# Patient Record
Sex: Male | Born: 1955
Health system: Southern US, Community
[De-identification: ages and names within clinical notes are randomized; demographics above are authoritative.]

## PROBLEM LIST (undated history)

## (undated) DIAGNOSIS — M1990 Unspecified osteoarthritis, unspecified site: Secondary | ICD-10-CM

## (undated) DIAGNOSIS — K219 Gastro-esophageal reflux disease without esophagitis: Secondary | ICD-10-CM

## (undated) DIAGNOSIS — G4730 Sleep apnea, unspecified: Secondary | ICD-10-CM

## (undated) HISTORY — PX: HERNIA REPAIR: SHX51

## (undated) HISTORY — PX: TONSILLECTOMY AND ADENOIDECTOMY: SUR1326

---

## 2005-12-04 DIAGNOSIS — E782 Mixed hyperlipidemia: Secondary | ICD-10-CM | POA: Insufficient documentation

## 2005-12-04 DIAGNOSIS — E7800 Pure hypercholesterolemia, unspecified: Secondary | ICD-10-CM | POA: Insufficient documentation

## 2007-05-20 HISTORY — PX: COLONOSCOPY: SHX174

## 2007-11-02 ENCOUNTER — Ambulatory Visit: Payer: Self-pay | Admitting: Gastroenterology

## 2007-11-02 LAB — HM COLONOSCOPY: HM COLON: NORMAL

## 2012-04-05 ENCOUNTER — Ambulatory Visit: Payer: Self-pay | Admitting: Family Medicine

## 2013-04-29 LAB — HM HEPATITIS C SCREENING LAB: HM Hepatitis Screen: NEGATIVE

## 2014-05-04 LAB — FECAL OCCULT BLOOD, GUAIAC: Fecal Occult Blood: NEGATIVE

## 2014-05-17 LAB — HEPATIC FUNCTION PANEL
ALK PHOS: 124 U/L (ref 25–125)
ALT: 10 U/L (ref 10–40)
AST: 14 U/L (ref 14–40)
Bilirubin, Total: 0.5 mg/dL

## 2014-05-17 LAB — BASIC METABOLIC PANEL
BUN: 11 mg/dL (ref 4–21)
CREATININE: 1 mg/dL (ref 0.6–1.3)
Glucose: 87 mg/dL
Potassium: 4.7 mmol/L (ref 3.4–5.3)
SODIUM: 143 mmol/L (ref 137–147)

## 2014-05-17 LAB — CBC AND DIFFERENTIAL
HEMATOCRIT: 45 % (ref 41–53)
HEMOGLOBIN: 15.7 g/dL (ref 13.5–17.5)
NEUTROS ABS: 65 /uL
Platelets: 236 10*3/uL (ref 150–399)
WBC: 7.1 10*3/mL

## 2014-05-17 LAB — LIPID PANEL
CHOLESTEROL: 195 mg/dL (ref 0–200)
HDL: 38 mg/dL (ref 35–70)
LDL Cholesterol: 135 mg/dL
TRIGLYCERIDES: 110 mg/dL (ref 40–160)

## 2014-05-17 LAB — PSA: PSA: 0.2

## 2015-06-11 DIAGNOSIS — E559 Vitamin D deficiency, unspecified: Secondary | ICD-10-CM | POA: Insufficient documentation

## 2015-06-11 DIAGNOSIS — Z8042 Family history of malignant neoplasm of prostate: Secondary | ICD-10-CM | POA: Insufficient documentation

## 2015-06-11 DIAGNOSIS — F17210 Nicotine dependence, cigarettes, uncomplicated: Secondary | ICD-10-CM | POA: Insufficient documentation

## 2015-06-15 ENCOUNTER — Ambulatory Visit
Admission: RE | Admit: 2015-06-15 | Discharge: 2015-06-15 | Disposition: A | Discharge status: Home / Self Care | Payer: BLUE CROSS/BLUE SHIELD | Source: Ambulatory Visit | Attending: Family Medicine | Admitting: Family Medicine

## 2015-06-15 ENCOUNTER — Ambulatory Visit (INDEPENDENT_AMBULATORY_CARE_PROVIDER_SITE_OTHER): Payer: BLUE CROSS/BLUE SHIELD | Admitting: Family Medicine

## 2015-06-15 ENCOUNTER — Encounter: Payer: Self-pay | Admitting: Family Medicine

## 2015-06-15 VITALS — BP 118/70 | HR 58 | Temp 98.1°F | Resp 14 | Ht 71.5 in | Wt 196.6 lb

## 2015-06-15 DIAGNOSIS — Z720 Tobacco use: Secondary | ICD-10-CM

## 2015-06-15 DIAGNOSIS — F17200 Nicotine dependence, unspecified, uncomplicated: Secondary | ICD-10-CM | POA: Insufficient documentation

## 2015-06-15 DIAGNOSIS — E7800 Pure hypercholesterolemia, unspecified: Secondary | ICD-10-CM

## 2015-06-15 DIAGNOSIS — Z8042 Family history of malignant neoplasm of prostate: Secondary | ICD-10-CM | POA: Diagnosis not present

## 2015-06-15 DIAGNOSIS — E559 Vitamin D deficiency, unspecified: Secondary | ICD-10-CM

## 2015-06-15 DIAGNOSIS — Z0000 Encounter for general adult medical examination without abnormal findings: Secondary | ICD-10-CM | POA: Diagnosis not present

## 2015-06-15 LAB — POCT URINALYSIS DIPSTICK
BILIRUBIN UA: NEGATIVE
Glucose, UA: NEGATIVE
Ketones, UA: NEGATIVE
LEUKOCYTES UA: NEGATIVE
NITRITE UA: NEGATIVE
PH UA: 6.5
Protein, UA: NEGATIVE
RBC UA: NEGATIVE
Spec Grav, UA: 1.025
UROBILINOGEN UA: 0.2

## 2015-06-15 NOTE — Patient Instructions (Signed)

## 2015-06-15 NOTE — Progress Notes (Signed)
Patient ID: Thomas Cross, male   DOB: 1955/05/31, 60 y.o.   MRN: [id number]       Patient: Thomas Cross, Male    DOB: [birth year], 60 y.o.   MRN: [id number] Visit Date: 06/15/2015  Today's Provider: [name], PA   Chief Complaint  Patient presents with  . Annual Exam   Subjective:    Annual physical exam Thomas Cross is a 60 y.o. male who presents today for health maintenance and complete physical. He feels well. He reports exercising none, works 7 days per week. He reports he is sleeping well (average 7-8 hours per night).  ----------------------------------------------------------------- Tdap: 03/18/2011 Colonoscopy: 11/02/2007- Normal Flu: declined  Review of Systems  Constitutional: Negative.   HENT: Positive for tinnitus.        Roaring in ears  Eyes: Positive for visual disturbance.  Respiratory: Positive for wheezing.   Cardiovascular: Negative.   Gastrointestinal: Negative.   Endocrine: Negative.   Genitourinary: Negative.   Musculoskeletal: Positive for back pain and arthralgias.  Skin: Negative.   Allergic/Immunologic: Negative.   Neurological: Negative.   Hematological: Bruises/bleeds easily.  Psychiatric/Behavioral: Negative.     Social History      He  reports that he has been smoking Cigarettes.  He has a 66 pack-year smoking history. He has never used smokeless tobacco. He reports that he drinks alcohol. He reports that he does not use illicit drugs.       Social History   Social History  . Marital Status: Married    Spouse Name: N/A  . Number of Children: N/A  . Years of Education: N/A   Social History Main Topics  . Smoking status: Current Every Day Smoker -- 1.50 packs/day for 44 years    Types: Cigarettes  . Smokeless tobacco: Never Used  . Alcohol Use: 0.0 oz/week    0 Standard drinks or equivalent per week     Comment: occasionally  . Drug Use: No  . Sexual Activity: Not Asked   Other Topics Concern  . None   Social  History Narrative    History reviewed. No pertinent past medical history.   Patient Active Problem List   Diagnosis Date Noted  . Family history of malignant neoplasm of prostate 06/11/2015  . Current tobacco use 06/11/2015  . Avitaminosis D 06/11/2015  . Hypercholesterolemia without hypertriglyceridemia 12/04/2005    Past Surgical History  Procedure Laterality Date  . Hernia repair    . Tonsillectomy and adenoidectomy      Family History        Family Status  Relation Status Death Age  . Mother Deceased   . Father Deceased   . Brother Deceased   . Maternal Grandfather Deceased   . Paternal Grandmother Deceased   . Paternal Grandfather Deceased         His family history includes Bladder Cancer in his mother; CVA in his father; Colon cancer in his paternal grandfather; Dementia in his father; Heart attack in his brother; Heart disease in his maternal grandfather and mother; Hyperlipidemia in his brother, brother, brother, brother, and sister; Hypertension in his father; Kidney cancer in his mother; Prostate cancer in his father; Stroke in his paternal grandmother.    No Known Allergies  Previous Medications   CHOLECALCIFEROL 1000 UNITS CAPSULE    Take by mouth.   MULTIPLE VITAMIN PO    Take by mouth.   OMEGA-3 FATTY ACIDS (FISH OIL) 1000 MG CPDR    Take by mouth.  Patient Care Team: Margo Common, PA as PCP - General (Family Medicine)     Objective:   Vitals: BP 118/70 mmHg  Pulse 58  Temp(Src) 98.1 F (36.7 C) (Oral)  Resp 14  Ht 5' 11.5" (1.816 m)  Wt 196 lb 9.6 oz (89.177 kg)  BMI 27.04 kg/m2  SpO2 98%   Physical Exam  Constitutional: He is oriented to person, place, and time. He appears well-developed and well-nourished.  HENT:  Head: Normocephalic and atraumatic.  Right Ear: External ear normal.  Left Ear: External ear normal.  Nose: Nose normal.  Mouth/Throat: Oropharynx is clear and moist.  Eyes: Conjunctivae and EOM are normal. Pupils are  equal, round, and reactive to light. Right eye exhibits no discharge.  Neck: Normal range of motion. Neck supple. No tracheal deviation present. No thyromegaly present.  Cardiovascular: Normal rate, regular rhythm, normal heart sounds and intact distal pulses.   No murmur heard. Pulmonary/Chest: Effort normal and breath sounds normal. No respiratory distress. He has no wheezes. He has no rales. He exhibits no tenderness.  Abdominal: Soft. He exhibits no distension and no mass. There is no tenderness. There is no rebound and no guarding.  Genitourinary: Rectum normal and penis normal. Guaiac negative stool.  Slightly enlarged prostate.  Musculoskeletal: Normal range of motion. He exhibits no edema or tenderness.  Lymphadenopathy:    He has no cervical adenopathy.  Neurological: He is alert and oriented to person, place, and time. He has normal reflexes. No cranial nerve deficit. He exhibits normal muscle tone. Coordination normal.  Skin: Skin is warm and dry. No rash noted. No erythema.  Psychiatric: He has a normal mood and affect. His behavior is normal. Judgment and thought content normal.   Depression Screen No suicidal ideation. Sleeping well. Normal affect.  Assessment & Plan:     Routine Health Maintenance and Physical Exam  Exercise Activities and Dietary recommendations No specific exercise program. Work is very physical in septic system business.  Immunization History  Administered Date(s) Administered  . Tdap 03/18/2011    Discussed health benefits of physical activity, and encouraged him to engage in regular exercise appropriate for his age and condition.    --------------------------------------------------------------------  1. Annual physical exam Feeling well. Good general health. Last colonoscopy was normal in 2009. Last Tdap was in 2012. Declines any immunizations at the present. Will get routine labs. - POCT urinalysis dipstick  2. Avitaminosis D Continues  vitamin-D 1000 IU qd. Will recheck CBC and vitamin- D level. - CBC with Differential/Platelet - VITAMIN D 25 Hydroxy (Vit-D Deficiency, Fractures)  3. Hypercholesterolemia without hypertriglyceridemia Still trying to take Omega-3 Fatty Acids and follow low fat diet. Recheck labs and follow up pending reports. - COMPLETE METABOLIC PANEL WITH GFR - Lipid panel - TSH  4. Current tobacco use Chronic smoker for 40 years and presently smokes 1.5 ppd. Requests CXR to compare to last x-ray in 2013. - DG Chest 2 View  5. Family history of malignant neoplasm of prostate Father had prostate cancer. DRE today shows some enlargement of gland. Denies urinary hesitancy, decrease stream or nocturia. Will check PSA. - PSA

## 2015-06-16 LAB — LIPID PANEL
Chol/HDL Ratio: 5.5 ratio units — ABNORMAL HIGH (ref 0.0–5.0)
Cholesterol, Total: 202 mg/dL — ABNORMAL HIGH (ref 100–199)
HDL: 37 mg/dL — AB (ref >39)
LDL Calculated: 141 mg/dL — ABNORMAL HIGH (ref 0–99)
TRIGLYCERIDES: 119 mg/dL (ref 0–149)
VLDL Cholesterol Cal: 24 mg/dL (ref 5–40)

## 2015-06-16 LAB — CBC WITH DIFFERENTIAL/PLATELET
Basophils Absolute: 0 10*3/uL (ref 0.0–0.2)
Basos: 0 %
EOS (ABSOLUTE): 0.2 10*3/uL (ref 0.0–0.4)
EOS: 2 %
Hematocrit: 44.1 % (ref 37.5–51.0)
Hemoglobin: 15.5 g/dL (ref 12.6–17.7)
IMMATURE GRANULOCYTES: 0 %
Immature Grans (Abs): 0 10*3/uL (ref 0.0–0.1)
LYMPHS: 22 %
Lymphocytes Absolute: 1.7 10*3/uL (ref 0.7–3.1)
MCH: 33.8 pg — ABNORMAL HIGH (ref 26.6–33.0)
MCHC: 35.1 g/dL (ref 31.5–35.7)
MCV: 96 fL (ref 79–97)
MONOS ABS: 0.7 10*3/uL (ref 0.1–0.9)
Monocytes: 9 %
NEUTROS PCT: 67 %
Neutrophils Absolute: 5.2 10*3/uL (ref 1.4–7.0)
PLATELETS: 252 10*3/uL (ref 150–379)
RBC: 4.59 x10E6/uL (ref 4.14–5.80)
RDW: 12.9 % (ref 12.3–15.4)
WBC: 7.7 10*3/uL (ref 3.4–10.8)

## 2015-06-16 LAB — COMPREHENSIVE METABOLIC PANEL
ALBUMIN: 4.1 g/dL (ref 3.6–4.8)
ALT: 14 IU/L (ref 0–44)
AST: 17 IU/L (ref 0–40)
Albumin/Globulin Ratio: 1.7 (ref 1.1–2.5)
Alkaline Phosphatase: 127 IU/L — ABNORMAL HIGH (ref 39–117)
BUN / CREAT RATIO: 13 (ref 10–22)
BUN: 13 mg/dL (ref 8–27)
Bilirubin Total: 0.6 mg/dL (ref 0.0–1.2)
CALCIUM: 9.2 mg/dL (ref 8.6–10.2)
CO2: 22 mmol/L (ref 18–29)
CREATININE: 0.99 mg/dL (ref 0.76–1.27)
Chloride: 102 mmol/L (ref 96–106)
GFR calc non Af Amer: 82 mL/min/{1.73_m2} (ref >59)
GFR, EST AFRICAN AMERICAN: 95 mL/min/{1.73_m2} (ref >59)
GLUCOSE: 84 mg/dL (ref 65–99)
Globulin, Total: 2.4 g/dL (ref 1.5–4.5)
Potassium: 4.9 mmol/L (ref 3.5–5.2)
Sodium: 140 mmol/L (ref 134–144)
TOTAL PROTEIN: 6.5 g/dL (ref 6.0–8.5)

## 2015-06-16 LAB — PSA: Prostate Specific Ag, Serum: 0.2 ng/mL (ref 0.0–4.0)

## 2015-06-16 LAB — TSH: TSH: 2.58 u[IU]/mL (ref 0.450–4.500)

## 2015-06-16 LAB — VITAMIN D 25 HYDROXY (VIT D DEFICIENCY, FRACTURES): Vit D, 25-Hydroxy: 26.9 ng/mL — ABNORMAL LOW (ref 30.0–100.0)

## 2015-06-18 ENCOUNTER — Telehealth: Payer: Self-pay

## 2015-06-18 NOTE — Telephone Encounter (Signed)
Patient advised as directed below. Patient verbalized understanding. Patient does not want to proceed with starting Pravastatin. Patient states he will think about it and call back.

## 2015-06-18 NOTE — Telephone Encounter (Signed)
-----   Message from Margo Common, [place] sent at 06/18/2015  1:45 PM EST ----- Normal chest x-ray without acute cardiopulmonary disease. Prostate blood test is normal. Vitamin-D level slightly low. Recommend 1000 IU qd of Vitamin-D. LDL higher and HDL lower than a year ago. Needs Pravastatin 20 mg qd #30 & 3 RF. Recheck lipids in 3 months.

## 2015-07-16 ENCOUNTER — Encounter: Payer: Self-pay | Admitting: Family Medicine

## 2015-07-16 ENCOUNTER — Ambulatory Visit (INDEPENDENT_AMBULATORY_CARE_PROVIDER_SITE_OTHER): Payer: BLUE CROSS/BLUE SHIELD | Admitting: Family Medicine

## 2015-07-16 VITALS — BP 130/72 | HR 72 | Temp 98.0°F | Resp 16 | Wt 196.2 lb

## 2015-07-16 DIAGNOSIS — R5383 Other fatigue: Secondary | ICD-10-CM | POA: Diagnosis not present

## 2015-07-16 DIAGNOSIS — R05 Cough: Secondary | ICD-10-CM

## 2015-07-16 DIAGNOSIS — R059 Cough, unspecified: Secondary | ICD-10-CM

## 2015-07-16 MED ORDER — PROMETHAZINE-DM 6.25-15 MG/5ML PO SYRP: 5.0000 mL | ORAL_SOLUTION | Freq: Four times a day (QID) | ORAL | Status: DC | PRN

## 2015-07-16 NOTE — Progress Notes (Signed)
Patient ID: Thomas Cross, male   DOB: Mar 07, 1956, 60 y.o.   MRN: [id number]   Patient: Thomas Cross Male    DOB: 09-Dec-1955   60 y.o.   MRN: [id number] Visit Date: 07/16/2015  Today's Provider: [name], PA   Chief Complaint  Patient presents with  . URI   Subjective:    URI  This is a new problem. Episode onset: 4-5 days ago. The problem has been unchanged. There has been no fever. Associated symptoms include congestion and coughing. Associated symptoms comments: Fatigue, cold sweats. He has tried nothing for the symptoms.   Patient Active Problem List   Diagnosis Date Noted  . Family history of malignant neoplasm of prostate 06/11/2015  . Current tobacco use 06/11/2015  . Avitaminosis D 06/11/2015  . Hypercholesterolemia without hypertriglyceridemia 12/04/2005   Past Surgical History  Procedure Laterality Date  . Hernia repair    . Tonsillectomy and adenoidectomy     Family History  Problem Relation Age of Onset  . Heart disease Mother   . Kidney cancer Mother   . Bladder Cancer Mother   . CVA Father   . Prostate cancer Father   . Dementia Father   . Hypertension Father   . Hyperlipidemia Sister   . Heart attack Brother   . Hyperlipidemia Brother   . Heart disease Maternal Grandfather   . Stroke Paternal Grandmother   . Colon cancer Paternal Grandfather   . Hyperlipidemia Brother   . Hyperlipidemia Brother   . Hyperlipidemia Brother    Previous Medications   CHOLECALCIFEROL 1000 UNITS CAPSULE    Take by mouth.   MULTIPLE VITAMIN PO    Take by mouth.   OMEGA-3 FATTY ACIDS (FISH OIL) 1000 MG CPDR    Take by mouth.    No Known Allergies  Review of Systems  Constitutional: Positive for fatigue.       Cold sweats  HENT: Positive for congestion.   Eyes: Negative.   Respiratory: Positive for cough.   Cardiovascular: Negative.   Gastrointestinal: Negative.   Endocrine: Negative.   Genitourinary: Negative.   Musculoskeletal: Negative.   Skin: Negative.    Allergic/Immunologic: Negative.   Neurological: Negative.   Hematological: Negative.   Psychiatric/Behavioral: Negative.     Social History  Substance Use Topics  . Smoking status: Current Every Day Smoker -- 1.50 packs/day for 44 years    Types: Cigarettes  . Smokeless tobacco: Never Used  . Alcohol Use: 0.0 oz/week    0 Standard drinks or equivalent per week     Comment: occasionally   Objective:   BP 130/72 mmHg  Pulse 72  Temp(Src) 98 F (36.7 C) (Oral)  Resp 16  Wt 196 lb 3.2 oz (88.996 kg)  Physical Exam  Constitutional: He is oriented to person, place, and time. He appears well-developed and well-nourished. No distress.  HENT:  Head: Normocephalic and atraumatic.  Right Ear: Hearing and external ear normal.  Left Ear: Hearing and external ear normal.  Nose: Nose normal.  Mouth/Throat: Oropharynx is clear and moist.  Eyes: Conjunctivae, EOM and lids are normal. Right eye exhibits no discharge. Left eye exhibits no discharge. No scleral icterus.  Neck: Normal range of motion. Neck supple.  Cardiovascular: Normal rate, regular rhythm and normal heart sounds.   Pulmonary/Chest: Effort normal and breath sounds normal. No respiratory distress.  Abdominal: Soft. Bowel sounds are normal. He exhibits mass. There is tenderness.  Tender lump in right inguinal fold near the scar of  a past herniorrhaphy.  Musculoskeletal: Normal range of motion.  Neurological: He is alert and oriented to person, place, and time.  Skin: Skin is intact. No lesion and no rash noted.  Psychiatric: He has a normal mood and affect. His speech is normal and behavior is normal. Thought content normal.      Assessment & Plan:     1. Cough Onset with body aches over the past 5 days. No fever, sore throat, nasal congestion or sputum production. Will treat with cough syrup and encouraged to drink extra fluids. Recheck prn. - POC Influenza A&B(BINAX/QUICKVUE) - promethazine-dextromethorphan  (PROMETHAZINE-DM) 6.25-15 MG/5ML syrup; Take 5 mLs by mouth 4 (four) times daily as needed for cough.  Dispense: 118 mL; Refill: 0  2. Other fatigue Onset with cough and body aches. Flu test negative for influenza A&B. May use Tylenol or Advil prn. - POC Influenza A&B(BINAX/QUICKVUE)

## 2015-07-16 NOTE — Patient Instructions (Signed)

## 2016-03-11 ENCOUNTER — Encounter: Payer: Self-pay | Admitting: Family Medicine

## 2016-03-11 ENCOUNTER — Ambulatory Visit: Payer: Self-pay | Admitting: Family Medicine

## 2016-03-11 ENCOUNTER — Ambulatory Visit
Admission: RE | Admit: 2016-03-11 | Discharge: 2016-03-11 | Disposition: A | Discharge status: Home / Self Care | Payer: BLUE CROSS/BLUE SHIELD | Source: Ambulatory Visit | Attending: Family Medicine | Admitting: Family Medicine

## 2016-03-11 ENCOUNTER — Ambulatory Visit (INDEPENDENT_AMBULATORY_CARE_PROVIDER_SITE_OTHER): Payer: BLUE CROSS/BLUE SHIELD | Admitting: Family Medicine

## 2016-03-11 VITALS — BP 122/86 | HR 64 | Temp 98.5°F | Resp 16 | Wt 198.0 lb

## 2016-03-11 DIAGNOSIS — G8929 Other chronic pain: Secondary | ICD-10-CM | POA: Diagnosis not present

## 2016-03-11 DIAGNOSIS — R6889 Other general symptoms and signs: Secondary | ICD-10-CM

## 2016-03-11 DIAGNOSIS — K4090 Unilateral inguinal hernia, without obstruction or gangrene, not specified as recurrent: Secondary | ICD-10-CM

## 2016-03-11 DIAGNOSIS — M25511 Pain in right shoulder: Secondary | ICD-10-CM | POA: Insufficient documentation

## 2016-03-11 NOTE — Progress Notes (Signed)
Patient: Thomas Cross Male    DOB: Jul 22, 1955   60 y.o.   MRN: [id number] Visit Date: 03/11/2016  Today's Provider: [name], PA   Chief Complaint  Patient presents with  . Hernia    possible  . Memory Loss   Subjective:    HPI Patient c/o possible hernia in right lower abdominal area on and off times several years. Patient denies pain or changes today.  Patient c/o right shoulder pain times 2-3 weeks. Patient denies injury, pt reports increased pain and stiffness during the night. Patient has not tried any OTC medications.  Patient denies memory loss.  Patient scored a 4 on 6 CIT today.   Patient Active Problem List   Diagnosis Date Noted  . Family history of malignant neoplasm of prostate 06/11/2015  . Current tobacco use 06/11/2015  . Avitaminosis D 06/11/2015  . Hypercholesterolemia without hypertriglyceridemia 12/04/2005   Past Surgical History:  Procedure Laterality Date  . HERNIA REPAIR    . TONSILLECTOMY AND ADENOIDECTOMY     Family History  Problem Relation Age of Onset  . Heart disease Mother   . Kidney cancer Mother   . Bladder Cancer Mother   . CVA Father   . Prostate cancer Father   . Dementia Father   . Hypertension Father   . Heart attack Brother   . Hyperlipidemia Brother   . Heart disease Maternal Grandfather   . Stroke Paternal Grandmother   . Colon cancer Paternal Grandfather   . Hyperlipidemia Sister   . Hyperlipidemia Brother   . Hyperlipidemia Brother   . Hyperlipidemia Brother    No Known Allergies  Current Outpatient Prescriptions:  .  Cholecalciferol 1000 units capsule, Take by mouth., Disp: , Rfl:  .  MULTIPLE VITAMIN PO, Take by mouth., Disp: , Rfl:  .  Omega-3 Fatty Acids (FISH OIL) 1000 MG CPDR, Take by mouth., Disp: , Rfl:  .  promethazine-dextromethorphan (PROMETHAZINE-DM) 6.25-15 MG/5ML syrup, Take 5 mLs by mouth 4 (four) times daily as needed for cough., Disp: 118 mL, Rfl: 0  Review of Systems  Social  History  Substance Use Topics  . Smoking status: Current Every Day Smoker    Packs/day: 1.50    Years: 44.00    Types: Cigarettes  . Smokeless tobacco: Never Used  . Alcohol use 0.0 oz/week     Comment: occasionally   Objective:   BP 122/86 (BP Location: Right Arm, Patient Position: Sitting, Cuff Size: Large)   Pulse 64   Temp 98.5 F (36.9 C) (Oral)   Resp 16   Wt 198 lb (89.8 kg)   BMI 27.23 kg/m    Cognitive Testing - 6-CIT  Correct? Score   What year is it? yes 0 0 or 4  What month is it? yes 0 0 or 3  Memorize:    Pia Mau,  42,  [place],      What time is it? (within 1 hour) yes 0 0 or 3  Count backwards from 20 yes 0 0, 2, or 4  Name the months of the year no 1 0, 2, or 4  Repeat name & address above no 3 0, 2, 4, 6, 8, or 10       TOTAL SCORE  4/28   Interpretation:  Normal  Normal (0-7) Abnormal (8-28)   Physical Exam  Constitutional: He is oriented to person, place, and time. He appears well-developed and well-nourished. No distress.  HENT:  Head: Normocephalic and atraumatic.  Right Ear: Hearing normal.  Left Ear: Hearing normal.  Nose: Nose normal.  Eyes: Conjunctivae and lids are normal. Right eye exhibits no discharge. Left eye exhibits no discharge. No scleral icterus.  Pulmonary/Chest: Effort normal. No respiratory distress.  Abdominal: Soft. Bowel sounds are normal. There is tenderness.  Small lump sensation with tenderness in the right groin when he coughs.  Musculoskeletal: Normal range of motion.  Neurological: He is alert and oriented to person, place, and time.  Skin: Skin is intact. No lesion and no rash noted.  Psychiatric: He has a normal mood and affect. His speech is normal and behavior is normal. Thought content normal.      Assessment & Plan:      1. Indirect inguinal hernia Noticing more frequent tenderness in a small lump in the RLQ of abdomen. Has a history of inguinal hernia repair years ago. Suspect recurrence  along the medial border of the incision line. Schedule surgical referral. - Ambulatory referral to General Surgery  2. Forgetfulness Feels he has been more stressed with the amount of work on his job. Essentially normal screening. Recognizes father had dementia later in life. Will monitor for changes and recheck annually.  3. Chronic right shoulder pain Intermittent pain in the right shoulder for a couple years. 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 Will get x-ray to rule out arthritis. May use Aleve prn. If no better in 2 weeks, may need to consider orthopedic referral. - DG Shoulder Right       [name], [facility]

## 2016-03-12 ENCOUNTER — Telehealth: Payer: Self-pay

## 2016-03-12 ENCOUNTER — Encounter: Payer: Self-pay | Admitting: *Deleted

## 2016-03-12 NOTE — Telephone Encounter (Signed)
Patient advised.

## 2016-03-12 NOTE — Telephone Encounter (Signed)
-----   Message from Margo Common, [place] sent at 03/12/2016  2:13 PM EDT ----- No fractures, dislocations or bone spurs. Suspect rotator cuff strain. Use Advil or Aleve for inflammation. If no better in 10-14 days, will need to consider referral to orthopedist.

## 2016-03-20 ENCOUNTER — Encounter: Payer: Self-pay | Admitting: *Deleted

## 2016-03-24 ENCOUNTER — Ambulatory Visit (INDEPENDENT_AMBULATORY_CARE_PROVIDER_SITE_OTHER): Payer: BLUE CROSS/BLUE SHIELD | Admitting: General Surgery

## 2016-03-24 ENCOUNTER — Encounter: Payer: Self-pay | Admitting: General Surgery

## 2016-03-24 VITALS — BP 140/78 | HR 78 | Resp 14 | Ht 72.0 in | Wt 195.0 lb

## 2016-03-24 DIAGNOSIS — K4091 Unilateral inguinal hernia, without obstruction or gangrene, recurrent: Secondary | ICD-10-CM | POA: Diagnosis not present

## 2016-03-24 NOTE — Patient Instructions (Signed)

## 2016-03-24 NOTE — Progress Notes (Signed)
Patient ID: Thomas Cross, male   DOB: 1955/06/13, 60 y.o.   MRN: [id number]  Chief Complaint  Patient presents with  . Inguinal Hernia    HPI Thomas Cross is a 60 y.o. male here today for a evaluation of a right inguinal hernia. He states that the pain started 3 weeks ago, but has gotten much better. He denies any bowel or urinary problems. About 30 yrs ago he had repair of a hernia in right groin-he has no details on this. I have reviewed the history of present illness with the patient.   HPI  No past medical history on file.  Past Surgical History:  Procedure Laterality Date  . COLONOSCOPY  2009  . HERNIA REPAIR  30 years ago   right inguinal  . TONSILLECTOMY AND ADENOIDECTOMY      Family History  Problem Relation Age of Onset  . Heart disease Mother   . Kidney cancer Mother   . Bladder Cancer Mother   . CVA Father   . Prostate cancer Father   . Dementia Father   . Hypertension Father   . Heart attack Brother   . Hyperlipidemia Brother   . Heart disease Maternal Grandfather   . Stroke Paternal Grandmother   . Colon cancer Paternal Grandfather   . Hyperlipidemia Sister   . Hyperlipidemia Brother   . Hyperlipidemia Brother   . Hyperlipidemia Brother     Social History Social History  Substance Use Topics  . Smoking status: Current Every Day Smoker    Packs/day: 1.50    Years: 44.00    Types: Cigarettes  . Smokeless tobacco: Never Used  . Alcohol use 0.0 oz/week     Comment: occasionally    No Known Allergies  Current Outpatient Prescriptions  Medication Sig Dispense Refill  . aspirin EC 81 MG tablet Take 81 mg by mouth daily.    . Cholecalciferol 1000 units capsule Take by mouth.    . MULTIPLE VITAMIN PO Take by mouth.    . Omega-3 Fatty Acids (FISH OIL) 1000 MG CPDR Take by mouth.     No current facility-administered medications for this visit.     Review of Systems Review of Systems  Constitutional: Negative.   Respiratory: Negative.    Cardiovascular: Negative.   Gastrointestinal: Negative.     Blood pressure 140/78, pulse 78, resp. rate 14, height 6' (1.829 m), weight 195 lb (88.5 kg).  Physical Exam Physical Exam  Constitutional: He is oriented to person, place, and time. He appears well-developed and well-nourished.  Eyes: Conjunctivae are normal. No scleral icterus.  Neck: Neck supple.  Cardiovascular: Normal rate, regular rhythm and normal heart sounds.   Pulmonary/Chest: Effort normal and breath sounds normal.  Abdominal: Soft. Normal appearance and bowel sounds are normal. There is no hepatosplenomegaly. There is no tenderness. A hernia is present. Hernia confirmed positive in the right inguinal area.  Lymphadenopathy:    He has no cervical adenopathy.  Neurological: He is alert and oriented to person, place, and time.  Skin: Skin is warm and dry.  Psychiatric: He has a normal mood and affect.    Data Reviewed   Assessment    Recurrent right inguinal hernia. Repair is indicated and laparoscopic approach discussed with pt    Plan        Hernia precautions and incarceration were discussed with the patient. If they develop symptoms of an incarcerated hernia, they were encouraged to seek prompt medical attention. I have recommended repair of  the hernia using mesh on an outpatient basis in the near future. The risk of infection was reviewed. The role of prosthetic mesh to minimize the risk of recurrence was reviewed.  Patient wishes to discuss a surgery date with his wife and then notify the office when he would like to proceed.   This has been scribed by Lesly Rubenstein LPN    [facility] G 03/24/2016, 3:40 PM

## 2016-04-02 ENCOUNTER — Encounter: Payer: Self-pay | Admitting: *Deleted

## 2016-04-02 NOTE — Progress Notes (Addendum)
Patient's surgery has been scheduled for 05-20-2016 at [facility]. It is okay for patient to continue 81 mg aspirin once daily.   History and physical will be updated the morning of procedure.

## 2016-05-06 ENCOUNTER — Other Ambulatory Visit: Payer: Self-pay | Admitting: General Surgery

## 2016-05-06 DIAGNOSIS — K4091 Unilateral inguinal hernia, without obstruction or gangrene, recurrent: Secondary | ICD-10-CM

## 2016-05-09 ENCOUNTER — Encounter
Admission: RE | Admit: 2016-05-09 | Discharge: 2016-05-09 | Disposition: A | Discharge status: Home / Self Care | Payer: BLUE CROSS/BLUE SHIELD | Source: Ambulatory Visit | Attending: General Surgery | Admitting: General Surgery

## 2016-05-09 HISTORY — DX: Unspecified osteoarthritis, unspecified site: M19.90

## 2016-05-09 HISTORY — DX: Gastro-esophageal reflux disease without esophagitis: K21.9

## 2016-05-09 NOTE — Patient Instructions (Signed)
  Your procedure is scheduled on: 05-21-15 (TUESDAY) Report to Same Day Surgery 2nd floor medical mall [facility] Entrance-take elevator on left to 2nd floor.  Check in with surgery information desk.) To find out your arrival time please call [phone] between 1PM - 3PM on 05-31-27-17 (FRIDAY)  Remember: Instructions that are not followed completely may result in serious medical risk, up to and including death, or upon the discretion of your surgeon and anesthesiologist your surgery may need to be rescheduled.    _x___ 1. Do not eat food or drink liquids after midnight. No gum chewing or hard candies.     __x__ 2. No Alcohol for 24 hours before or after surgery.   __x__3. No Smoking for 24 prior to surgery.   ____  4. Bring all medications with you on the day of surgery if instructed.    __x__ 5. Notify your doctor if there is any change in your medical condition     (cold, fever, infections).     Do not wear jewelry, make-up, hairpins, clips or nail polish.  Do not wear lotions, powders, or perfumes. You may wear deodorant.  Do not shave 48 hours prior to surgery. Men may shave face and neck.  Do not bring valuables to the hospital.    [facility] is not responsible for any belongings or valuables.               Contacts, dentures or bridgework may not be worn into surgery.  Leave your suitcase in the car. After surgery it may be brought to your room.  For patients admitted to the hospital, discharge time is determined by your treatment team.   Patients discharged the day of surgery will not be allowed to drive home.  You will need someone to drive you home and stay with you the night of your procedure.    Please read over the following fact sheets that you were given:   [facility] Preparing for Surgery and or MRSA Information   ____ Take these medicines the morning of surgery with A SIP OF WATER:    1.NONE  2.  3.  4.  5.  6.  ____Fleets enema or Magnesium Citrate as  directed.   ____ Use CHG Soap or sage wipes as directed on instruction sheet   ____ Use inhalers on the day of surgery and bring to hospital day of surgery  ____ Stop metformin 2 days prior to surgery    ____ Take 1/2 of usual insulin dose the night before surgery and none on the morning of surgery.   ____ Stop Aspirin, Coumadin, Pllavix ,Eliquis, Effient, or Pradaxa  x__ Stop Anti-inflammatories such as Advil, Aleve, Ibuprofen, Motrin, Naproxen,          Naprosyn, Goodies powders or aspirin products 7 DAYS PRIOR TO SURGERY-Ok to take Tylenol.   _X___ Stop supplements until after surgery-STOP FISH OIL AND LIPOFLAVONOID 7 DAYS PRIOIR TO SURGERY  ____ Bring C-Pap to the hospital.

## 2016-05-20 ENCOUNTER — Ambulatory Visit
Admission: RE | Admit: 2016-05-20 | Discharge: 2016-05-20 | Disposition: A | Discharge status: Home / Self Care | Payer: BLUE CROSS/BLUE SHIELD | Source: Ambulatory Visit | Attending: General Surgery | Admitting: General Surgery

## 2016-05-20 ENCOUNTER — Ambulatory Visit: Payer: BLUE CROSS/BLUE SHIELD | Admitting: Anesthesiology

## 2016-05-20 ENCOUNTER — Encounter: Payer: Self-pay | Admitting: *Deleted

## 2016-05-20 ENCOUNTER — Encounter
Admission: RE | Disposition: A | Discharge status: Home / Self Care | Payer: Self-pay | Source: Ambulatory Visit | Attending: General Surgery

## 2016-05-20 DIAGNOSIS — Z7982 Long term (current) use of aspirin: Secondary | ICD-10-CM | POA: Diagnosis not present

## 2016-05-20 DIAGNOSIS — K219 Gastro-esophageal reflux disease without esophagitis: Secondary | ICD-10-CM | POA: Diagnosis not present

## 2016-05-20 DIAGNOSIS — F17210 Nicotine dependence, cigarettes, uncomplicated: Secondary | ICD-10-CM | POA: Insufficient documentation

## 2016-05-20 DIAGNOSIS — M1990 Unspecified osteoarthritis, unspecified site: Secondary | ICD-10-CM | POA: Insufficient documentation

## 2016-05-20 DIAGNOSIS — K4091 Unilateral inguinal hernia, without obstruction or gangrene, recurrent: Secondary | ICD-10-CM

## 2016-05-20 DIAGNOSIS — Z79899 Other long term (current) drug therapy: Secondary | ICD-10-CM | POA: Insufficient documentation

## 2016-05-20 HISTORY — PX: INGUINAL HERNIA REPAIR: SHX194

## 2016-05-20 SURGERY — REPAIR, HERNIA, INGUINAL, LAPAROSCOPIC
Anesthesia: General | Laterality: Right | Wound class: Clean

## 2016-05-20 MED ORDER — ROCURONIUM BROMIDE 100 MG/10ML IV SOLN
INTRAVENOUS | Status: DC | PRN
  Administered 2016-05-20: 10 mg via INTRAVENOUS
  Administered 2016-05-20: 20 mg via INTRAVENOUS

## 2016-05-20 MED ORDER — ACETAMINOPHEN 10 MG/ML IV SOLN
INTRAVENOUS | Status: AC
  Filled 2016-05-20: qty 100

## 2016-05-20 MED ORDER — OXYCODONE-ACETAMINOPHEN 5-325 MG PO TABS
1.0000 | ORAL_TABLET | ORAL | 0 refills | Status: DC | PRN
Start: 2016-05-20 — End: 2016-05-27

## 2016-05-20 MED ORDER — CEFAZOLIN SODIUM-DEXTROSE 2-4 GM/100ML-% IV SOLN
2.0000 g | INTRAVENOUS | Status: AC
  Administered 2016-05-20: 2 g via INTRAVENOUS

## 2016-05-20 MED ORDER — DEXAMETHASONE SODIUM PHOSPHATE 10 MG/ML IJ SOLN
INTRAMUSCULAR | Status: AC
  Filled 2016-05-20: qty 1

## 2016-05-20 MED ORDER — FAMOTIDINE 20 MG PO TABS
20.0000 mg | ORAL_TABLET | Freq: Once | ORAL | Status: AC
  Administered 2016-05-20: 20 mg via ORAL

## 2016-05-20 MED ORDER — MIDAZOLAM HCL 2 MG/2ML IJ SOLN
INTRAMUSCULAR | Status: AC
  Filled 2016-05-20: qty 2

## 2016-05-20 MED ORDER — SUCCINYLCHOLINE CHLORIDE 200 MG/10ML IV SOSY
PREFILLED_SYRINGE | INTRAVENOUS | Status: AC
  Filled 2016-05-20: qty 10

## 2016-05-20 MED ORDER — PROPOFOL 10 MG/ML IV BOLUS
INTRAVENOUS | Status: AC
  Filled 2016-05-20: qty 20

## 2016-05-20 MED ORDER — FENTANYL CITRATE (PF) 100 MCG/2ML IJ SOLN
INTRAMUSCULAR | Status: DC | PRN
  Administered 2016-05-20 (×2): 50 ug via INTRAVENOUS

## 2016-05-20 MED ORDER — SUCCINYLCHOLINE CHLORIDE 20 MG/ML IJ SOLN
INTRAMUSCULAR | Status: DC | PRN
  Administered 2016-05-20: 100 mg via INTRAVENOUS

## 2016-05-20 MED ORDER — MEPERIDINE HCL 25 MG/ML IJ SOLN: 6.2500 mg | INTRAMUSCULAR | Status: DC | PRN

## 2016-05-20 MED ORDER — SUGAMMADEX SODIUM 200 MG/2ML IV SOLN
INTRAVENOUS | Status: DC | PRN
  Administered 2016-05-20: 200 mg via INTRAVENOUS

## 2016-05-20 MED ORDER — KETOROLAC TROMETHAMINE 30 MG/ML IJ SOLN
INTRAMUSCULAR | Status: DC | PRN
  Administered 2016-05-20: 30 mg via INTRAVENOUS

## 2016-05-20 MED ORDER — OXYCODONE HCL 5 MG/5ML PO SOLN: 5.0000 mg | Freq: Once | ORAL | Status: DC | PRN

## 2016-05-20 MED ORDER — LIDOCAINE 2% (20 MG/ML) 5 ML SYRINGE
INTRAMUSCULAR | Status: AC
  Filled 2016-05-20: qty 5

## 2016-05-20 MED ORDER — LIDOCAINE HCL (CARDIAC) 20 MG/ML IV SOLN
INTRAVENOUS | Status: DC | PRN
  Administered 2016-05-20: 100 mg via INTRAVENOUS

## 2016-05-20 MED ORDER — LACTATED RINGERS IV SOLN
INTRAVENOUS | Status: DC
  Administered 2016-05-20 (×2): via INTRAVENOUS

## 2016-05-20 MED ORDER — PROPOFOL 10 MG/ML IV BOLUS
INTRAVENOUS | Status: DC | PRN
Start: 2016-05-20 — End: 2016-05-20
  Administered 2016-05-20: 160 mg via INTRAVENOUS

## 2016-05-20 MED ORDER — FENTANYL CITRATE (PF) 100 MCG/2ML IJ SOLN: 25.0000 ug | INTRAMUSCULAR | Status: DC | PRN

## 2016-05-20 MED ORDER — MIDAZOLAM HCL 2 MG/2ML IJ SOLN
INTRAMUSCULAR | Status: DC | PRN
  Administered 2016-05-20: 2 mg via INTRAVENOUS

## 2016-05-20 MED ORDER — CEFAZOLIN SODIUM-DEXTROSE 2-4 GM/100ML-% IV SOLN
INTRAVENOUS | Status: AC
  Filled 2016-05-20: qty 100

## 2016-05-20 MED ORDER — FAMOTIDINE 20 MG PO TABS
ORAL_TABLET | ORAL | Status: AC
  Filled 2016-05-20: qty 1

## 2016-05-20 MED ORDER — ROCURONIUM BROMIDE 50 MG/5ML IV SOSY
PREFILLED_SYRINGE | INTRAVENOUS | Status: AC
  Filled 2016-05-20: qty 5

## 2016-05-20 MED ORDER — CHLORHEXIDINE GLUCONATE CLOTH 2 % EX PADS: 6.0000 | MEDICATED_PAD | Freq: Once | CUTANEOUS | Status: DC

## 2016-05-20 MED ORDER — ACETAMINOPHEN 10 MG/ML IV SOLN
INTRAVENOUS | Status: DC | PRN
  Administered 2016-05-20: 1000 mg via INTRAVENOUS

## 2016-05-20 MED ORDER — HYDROMORPHONE HCL 1 MG/ML IJ SOLN
INTRAMUSCULAR | Status: AC
  Filled 2016-05-20: qty 1

## 2016-05-20 MED ORDER — DEXMEDETOMIDINE HCL IN NACL 200 MCG/50ML IV SOLN
INTRAVENOUS | Status: AC
  Filled 2016-05-20: qty 50

## 2016-05-20 MED ORDER — DEXMEDETOMIDINE HCL IN NACL 400 MCG/100ML IV SOLN
INTRAVENOUS | Status: DC | PRN
  Administered 2016-05-20: 12 ug/kg/h via INTRAVENOUS

## 2016-05-20 MED ORDER — FENTANYL CITRATE (PF) 100 MCG/2ML IJ SOLN
INTRAMUSCULAR | Status: AC
  Filled 2016-05-20: qty 2

## 2016-05-20 MED ORDER — PROMETHAZINE HCL 25 MG/ML IJ SOLN: 6.2500 mg | INTRAMUSCULAR | Status: DC | PRN

## 2016-05-20 MED ORDER — HYDROMORPHONE HCL 1 MG/ML IJ SOLN
INTRAMUSCULAR | Status: DC | PRN
  Administered 2016-05-20: .4 mg via INTRAVENOUS
  Administered 2016-05-20: .6 mg via INTRAVENOUS

## 2016-05-20 MED ORDER — DEXAMETHASONE SODIUM PHOSPHATE 10 MG/ML IJ SOLN
INTRAMUSCULAR | Status: DC | PRN
  Administered 2016-05-20: 5 mg via INTRAVENOUS

## 2016-05-20 MED ORDER — OXYCODONE HCL 5 MG PO TABS: 5.0000 mg | ORAL_TABLET | Freq: Once | ORAL | Status: DC | PRN

## 2016-05-20 MED ORDER — KETOROLAC TROMETHAMINE 30 MG/ML IJ SOLN
INTRAMUSCULAR | Status: AC
  Filled 2016-05-20: qty 1

## 2016-05-20 SURGICAL SUPPLY — 32 items
BLADE SURG 11 STRL SS SAFETY (MISCELLANEOUS) ×3 IMPLANT
CANISTER SUCT 1200ML W/VALVE (MISCELLANEOUS) ×3 IMPLANT
CATH COUDE FOLEY 2W 5CC 16FR (CATHETERS) ×3 IMPLANT
CATH TRAY 16F METER LATEX (MISCELLANEOUS) ×3 IMPLANT
CHLORAPREP W/TINT 26ML (MISCELLANEOUS) ×3 IMPLANT
DEFOGGER SCOPE WARMER CLEARIFY (MISCELLANEOUS) ×3 IMPLANT
DERMABOND ADVANCED (GAUZE/BANDAGES/DRESSINGS) ×2
DERMABOND ADVANCED .7 DNX12 (GAUZE/BANDAGES/DRESSINGS) ×1 IMPLANT
DEVICE SECURE STRAP 25 ABSORB (INSTRUMENTS) ×3 IMPLANT
DRAPE INCISE IOBAN 66X45 STRL (DRAPES) ×3 IMPLANT
ELECT REM PT RETURN 9FT ADLT (ELECTROSURGICAL) ×3
ELECTRODE REM PT RTRN 9FT ADLT (ELECTROSURGICAL) ×1 IMPLANT
GLOVE BIO SURGEON STRL SZ7 (GLOVE) ×15 IMPLANT
GOWN STRL REUS W/ TWL LRG LVL3 (GOWN DISPOSABLE) ×2 IMPLANT
GOWN STRL REUS W/TWL LRG LVL3 (GOWN DISPOSABLE) ×4
GRASPER SUT TROCAR 14GX15 (MISCELLANEOUS) ×3 IMPLANT
IRRIGATION STRYKERFLOW (MISCELLANEOUS) IMPLANT
IRRIGATOR STRYKERFLOW (MISCELLANEOUS)
IV LACTATED RINGERS 1000ML (IV SOLUTION) IMPLANT
KIT RM TURNOVER STRD PROC AR (KITS) ×3 IMPLANT
LABEL OR SOLS (LABEL) ×3 IMPLANT
MESH 3DMAX 3X5 RT MED (Mesh General) ×3 IMPLANT
NEEDLE VERESS 14GA 120MM (NEEDLE) ×3 IMPLANT
PACK LAP CHOLECYSTECTOMY (MISCELLANEOUS) ×3 IMPLANT
SCISSORS METZENBAUM CVD 33 (INSTRUMENTS) ×3 IMPLANT
SHEARS HARMONIC ACE PLUS 36CM (ENDOMECHANICALS) IMPLANT
SLEEVE ENDOPATH XCEL 5M (ENDOMECHANICALS) ×3 IMPLANT
SUT VIC AB 0 CT2 27 (SUTURE) ×3 IMPLANT
SUT VIC AB 4-0 FS2 27 (SUTURE) ×3 IMPLANT
TROCAR XCEL NON-BLD 11X100MML (ENDOMECHANICALS) ×3 IMPLANT
TROCAR XCEL NON-BLD 5MMX100MML (ENDOMECHANICALS) ×3 IMPLANT
TUBING INSUFFLATOR HI FLOW (MISCELLANEOUS) ×3 IMPLANT

## 2016-05-20 NOTE — Anesthesia Postprocedure Evaluation (Signed)
Anesthesia Post Note  Patient: Thomas Cross  Procedure(s) Performed: Procedure(s) (LRB): LAPAROSCOPIC INGUINAL HERNIA (Right)  Patient location during evaluation: PACU Anesthesia Type: General Level of consciousness: awake and alert and oriented Pain management: pain level controlled Vital Signs Assessment: post-procedure vital signs reviewed and stable Respiratory status: spontaneous breathing, nonlabored ventilation and respiratory function stable Cardiovascular status: blood pressure returned to baseline and stable Postop Assessment: no signs of nausea or vomiting Anesthetic complications: no     Last Vitals:  Vitals:   05/20/16 0935 05/20/16 0953  BP: 120/79 (!) 145/67  Pulse: (!) 55 67  Resp: 14 16  Temp:  36.6 C    Last Pain:  Vitals:   05/20/16 0953  TempSrc: Temporal  PainSc: 3                  Radford Pease

## 2016-05-20 NOTE — Anesthesia Procedure Notes (Signed)
Procedure Name: Intubation Date/Time: 05/20/2016 7:38 AM Performed by: [facility] Pre-anesthesia Checklist: Patient identified, Emergency Drugs available, Suction available and Patient being monitored Patient Re-evaluated:Patient Re-evaluated prior to inductionOxygen Delivery Method: Circle system utilized Preoxygenation: Pre-oxygenation with 100% oxygen Intubation Type: IV induction Ventilation: Mask ventilation without difficulty Laryngoscope Size: Mac and 3 Grade View: Grade I Tube type: Oral Tube size: 7.0 mm Number of attempts: 1 Airway Equipment and Method: Stylet Placement Confirmation: ETT inserted through vocal cords under direct vision,  positive ETCO2 and breath sounds checked- equal and bilateral Secured at: 21 cm Tube secured with: Tape Dental Injury: Teeth and Oropharynx as per pre-operative assessment

## 2016-05-20 NOTE — Anesthesia Preprocedure Evaluation (Signed)
Anesthesia Evaluation  Patient identified by MRN, date of birth, ID band Patient awake    Reviewed: Allergy & Precautions, NPO status , Patient's Chart, lab work & pertinent test results  History of Anesthesia Complications Negative for: history of anesthetic complications  Airway Mallampati: II  TM Distance: >3 FB Neck ROM: Full    Dental  (+) Edentulous Upper, Edentulous Lower   Pulmonary neg sleep apnea, neg COPD, Current Smoker,    breath sounds clear to auscultation- rhonchi (-) wheezing      Cardiovascular Exercise Tolerance: Good (-) hypertension(-) CAD and (-) Past MI  Rhythm:Regular Rate:Normal - Systolic murmurs and - Diastolic murmurs    Neuro/Psych negative neurological ROS  negative psych ROS   GI/Hepatic Neg liver ROS, GERD  ,  Endo/Other  negative endocrine ROSneg diabetes  Renal/GU negative Renal ROS     Musculoskeletal  (+) Arthritis ,   Abdominal (+) - obese,   Peds  Hematology negative hematology ROS (+)   Anesthesia Other Findings Past Medical History: No date: Arthritis No date: GERD (gastroesophageal reflux disease)     Comment: RARE-TUMS   Reproductive/Obstetrics                             Anesthesia Physical Anesthesia Plan  ASA: II  Anesthesia Plan: General   Post-op Pain Management:    Induction: Intravenous  Airway Management Planned: Oral ETT  Additional Equipment:   Intra-op Plan:   Post-operative Plan: Extubation in OR  Informed Consent: I have reviewed the patients History and Physical, chart, labs and discussed the procedure including the risks, benefits and alternatives for the proposed anesthesia with the patient or authorized representative who has indicated his/her understanding and acceptance.   Dental advisory given  Plan Discussed with: CRNA and Anesthesiologist  Anesthesia Plan Comments:         Anesthesia Quick  Evaluation

## 2016-05-20 NOTE — H&P (Signed)
Thomas Cross is an 61 y.o. male.   Chief Complaint: herniaHPI: 61 yr old male with a recurrent right inguinal hernia, minimally symptomatic. Last seen 2 mos ago-no interval changes in history.   Past Medical History:  Diagnosis Date  . Arthritis   . GERD (gastroesophageal reflux disease)    RARE-TUMS    Past Surgical History:  Procedure Laterality Date  . COLONOSCOPY  2009  . HERNIA REPAIR  30 years ago   right inguinal  . TONSILLECTOMY AND ADENOIDECTOMY      Family History  Problem Relation Age of Onset  . Heart disease Mother   . Kidney cancer Mother   . Bladder Cancer Mother   . CVA Father   . Prostate cancer Father   . Dementia Father   . Hypertension Father   . Heart attack Brother   . Hyperlipidemia Brother   . Heart disease Maternal Grandfather   . Stroke Paternal Grandmother   . Colon cancer Paternal Grandfather   . Hyperlipidemia Sister   . Hyperlipidemia Brother   . Hyperlipidemia Brother   . Hyperlipidemia Brother    Social History:  reports that he has been smoking Cigarettes.  He has a 66.00 pack-year smoking history. He has never used smokeless tobacco. He reports that he drinks alcohol. He reports that he does not use drugs.  Allergies: No Known Allergies  Medications Prior to Admission  Medication Sig Dispense Refill  . aspirin EC 81 MG tablet Take 81 mg by mouth every evening.     . calcium carbonate (TUMS - DOSED IN MG ELEMENTAL CALCIUM) 500 MG chewable tablet Chew 1 tablet by mouth as needed for indigestion or heartburn.    . Cholecalciferol 1000 units capsule Take 1,000 Units by mouth every evening.     . Multiple Vitamins-Minerals (MULTIVITAMIN WITH MINERALS) tablet Take 1 tablet by mouth every evening.     . Omega-3 Fatty Acids (FISH OIL) 1000 MG CPDR Take 1 capsule by mouth every evening.     . Vitamins-Lipotropics (LIPOFLAVONOID) TABS Take 1 tablet by mouth every evening.       No results found for this or any previous visit (from the  past 48 hour(s)). No results found.  Review of Systems  Constitutional: Negative.   Respiratory: Negative.   Cardiovascular: Negative.   Gastrointestinal: Negative.   Genitourinary: Negative.   Musculoskeletal: Negative.     Blood pressure 119/67, pulse (!) 59, temperature 97.4 F (36.3 C), temperature source Tympanic, resp. rate 18, height 6' (1.829 m), weight 195 lb (88.5 kg), SpO2 98 %. Physical Exam  Constitutional: He is oriented to person, place, and time. He appears well-developed and well-nourished.  Eyes: Conjunctivae are normal. No scleral icterus.  Neck: Neck supple.  Cardiovascular: Normal rate, regular rhythm and normal heart sounds.   Respiratory: Effort normal and breath sounds normal.  GI: Soft. Bowel sounds are normal. There is no tenderness. A hernia is present. Hernia confirmed positive in the right inguinal area (small to medium size, reducible).  Lymphadenopathy:    He has no cervical adenopathy.  Neurological: He is alert and oriented to person, place, and time.  Skin: Skin is warm and dry.     Assessment/Plan Recurrent right inguinal hernia. Proceed with laparoscopic repair as scheduled.  Christene Lye, MD 05/20/2016, 7:05 AM

## 2016-05-20 NOTE — Discharge Instructions (Signed)

## 2016-05-20 NOTE — Transfer of Care (Signed)
Immediate Anesthesia Transfer of Care Note  Patient: Thomas Cross  Procedure(s) Performed: Procedure(s): LAPAROSCOPIC INGUINAL HERNIA (Right)  Patient Location: PACU  Anesthesia Type:General  Level of Consciousness: sedated  Airway & Oxygen Therapy: Patient Spontanous Breathing and Patient connected to face mask oxygen  Post-op Assessment: Report given to RN and Post -op Vital signs reviewed and stable  Post vital signs: Reviewed and stable  Last Vitals:  Vitals:   05/20/16 0608  BP: 119/67  Pulse: (!) 59  Resp: 18  Temp: 36.3 C    Last Pain:  Vitals:   05/20/16 0608  TempSrc: Tympanic         Complications: No apparent anesthesia complications

## 2016-05-20 NOTE — Op Note (Signed)
Preop diagnosis: Recurrent right inguinal hernia  Post op diagnosis: Same direct variety  Operation: Laparoscopy repair of recurrent right inguinal hernia  Surgeon: Mckinley Jewel  Assistant:     Anesthesia: Gen.  Complications: None  EBL: Minimal  Drains: None  Description: Patient was put to sleep in supine position the operating table. An attempt was made to place a Foley catheter and however this was unsuccessful due to restriction in negotiating through the prostate. Further attempts were not made after attempting a smaller coud catheter which was also unsuccessful. Patient had no ongoing symptoms with urination but this will be the evaluated as an outpatient by urology. Abdomen was prepped and draped as sterile field and timeout performed. Initial port incision was made at the upper lip of the umbilicus and a Veress needle position the peritoneal cavity verified of the hanging drop method. Pneumoperitoneum was obtained followed by placement of 11 mm port. Subsequent the 2 lateral 5 mm ports were placed. In the right inguinal region there was evidence of a small defect located in the medial aspect of the inguinal canal area above the pubic symphysis. Part of omentum was herniating through this. With careful exposure the omentum was freed and pulled back. A few adhesions were noted in the lateral aspect of the inguinal region which were then taken down with cautery. The peritoneum was then opened with cautery along the superior aspect of the inguinal canal from medial to lateral aspect. The retroperitoneal space behind the inguinal region was then dissected out to expose the symphysis and the inguinal ligament. The hernial sac which was made above the peritoneum plus the preperitoneal fat was prepped pushed back into the abdominal portion leaving a defect of about a centimeter in the posterior wall of the inguinal canal in the medial aspect. After all the landmarks were well delineated a Bard  3-D mesh was placed inside and positioned against the posterior wall of the inguinal region. Secure strap was then utilized to tack it to the pubic tubercle and the muscular tissue medially a few placed along the inguinal ligament and couple superiorly. The mesh was noted to lay satisfactorily covering the defect and the entire posterior wall. The peritoneal opening was then reapproximated to cover the mesh also using the secure strap. Following this area a suture passer was used to close the fascial defect at the umbilicus 0 Vicryl. Pneumoperitoneum was released and the remaining ports removed. All skin incisions closed with subcuticular 4-0 Vicryl covered with Dermabond. Procedure was well-tolerated with no immediate problems encountered. Patient subsequently was extubated and returned recovery room stable condition.

## 2016-05-21 ENCOUNTER — Encounter: Payer: Self-pay | Admitting: General Surgery

## 2016-05-27 ENCOUNTER — Encounter: Payer: Self-pay | Admitting: General Surgery

## 2016-05-27 ENCOUNTER — Ambulatory Visit (INDEPENDENT_AMBULATORY_CARE_PROVIDER_SITE_OTHER): Payer: BLUE CROSS/BLUE SHIELD | Admitting: General Surgery

## 2016-05-27 VITALS — BP 118/74 | HR 58 | Resp 16 | Ht 72.0 in | Wt 195.0 lb

## 2016-05-27 DIAGNOSIS — K4091 Unilateral inguinal hernia, without obstruction or gangrene, recurrent: Secondary | ICD-10-CM

## 2016-05-27 NOTE — Patient Instructions (Signed)
The patient is aware to call back for any questions or concerns.  

## 2016-05-27 NOTE — Progress Notes (Signed)
Patient ID: Thomas Cross, male   DOB: 03-Nov-1955, 61 y.o.   MRN: [id number]  Chief Complaint  Patient presents with  . Routine Post Op    HPI Thomas Cross is a 61 y.o. male.  Here today for postoperative visit, he states he is doing well. Denies any gastrointestinal issues, bowels are moving regular. Only currently taking Aleve as needed. I have reviewed the history of present illness with the patient.   HPI  Past Medical History:  Diagnosis Date  . Arthritis   . GERD (gastroesophageal reflux disease)    RARE-TUMS    Past Surgical History:  Procedure Laterality Date  . COLONOSCOPY  2009  . HERNIA REPAIR  30 years ago   right inguinal  . INGUINAL HERNIA REPAIR Right 05/20/2016   Procedure: LAPAROSCOPIC INGUINAL HERNIA;  Surgeon: Christene Lye, MD;  Location: [facility] ORS;  Service: General;  Laterality: Right;  . TONSILLECTOMY AND ADENOIDECTOMY      Family History  Problem Relation Age of Onset  . Heart disease Mother   . Kidney cancer Mother   . Bladder Cancer Mother   . CVA Father   . Prostate cancer Father   . Dementia Father   . Hypertension Father   . Heart attack Brother   . Hyperlipidemia Brother   . Heart disease Maternal Grandfather   . Stroke Paternal Grandmother   . Colon cancer Paternal Grandfather   . Hyperlipidemia Sister   . Hyperlipidemia Brother   . Hyperlipidemia Brother   . Hyperlipidemia Brother     Social History Social History  Substance Use Topics  . Smoking status: Current Every Day Smoker    Packs/day: 1.50    Years: 44.00    Types: Cigarettes  . Smokeless tobacco: Never Used  . Alcohol use 0.0 oz/week     Comment: MIXED DRINKS EVERY DAY    No Known Allergies  Current Outpatient Prescriptions  Medication Sig Dispense Refill  . aspirin EC 81 MG tablet Take 81 mg by mouth every evening.     . calcium carbonate (TUMS - DOSED IN MG ELEMENTAL CALCIUM) 500 MG chewable tablet Chew 1 tablet by mouth as needed for indigestion or  heartburn.    . Cholecalciferol 1000 units capsule Take 1,000 Units by mouth every evening.     . Multiple Vitamins-Minerals (MULTIVITAMIN WITH MINERALS) tablet Take 1 tablet by mouth every evening.     . Omega-3 Fatty Acids (FISH OIL) 1000 MG CPDR Take 1 capsule by mouth every evening.     . Vitamins-Lipotropics (LIPOFLAVONOID) TABS Take 1 tablet by mouth every evening.      No current facility-administered medications for this visit.     Review of Systems Review of Systems  Constitutional: Negative.   Respiratory: Negative.   Cardiovascular: Negative.     Blood pressure 118/74, pulse (!) 58, resp. rate 16, height 6' (1.829 m), weight 195 lb (88.5 kg).  Physical Exam Physical Exam  Constitutional: He is oriented to person, place, and time. He appears well-developed and well-nourished.  Abdominal: Soft. Normal appearance. There is no tenderness.  Port sites intact with dermabond  Neurological: He is alert and oriented to person, place, and time.  Skin: Skin is warm and dry.  Psychiatric: His behavior is normal.  No hernia detected in the right inguinal region.  Data Reviewed Progress note   Assessment    One week post-operative right inguinal hernia repair. Good immediate results from operation.    Plan  Refrain from physical exertion for another week, then gradually increase activity. Patient vocalized understanding. Follow up in 5 weeks. Patient has been informed to call back with any concerns or new sx.      This information has been scribed by Karie Fetch RN, BSN,BC.  Quantia Grullon G 05/28/2016, 10:32 AM

## 2016-06-03 ENCOUNTER — Telehealth: Payer: Self-pay | Admitting: Family Medicine

## 2016-06-03 NOTE — Telephone Encounter (Signed)
Patient has accident insurance and wonders if we could classify his hernia surgery as an accident. Advised him I did not she how it could be considered an accident. He will call his surgeon (Dr. Jamal Collin) so see if he can help get this covered by this insurance.

## 2016-06-03 NOTE — Telephone Encounter (Signed)
Patient is asking that you call him about a note he needs for his hernia surgery.  Said he wanted to speak directly to you.  [phone]

## 2016-06-10 ENCOUNTER — Ambulatory Visit (INDEPENDENT_AMBULATORY_CARE_PROVIDER_SITE_OTHER): Payer: BLUE CROSS/BLUE SHIELD | Admitting: General Surgery

## 2016-06-10 ENCOUNTER — Encounter: Payer: Self-pay | Admitting: General Surgery

## 2016-06-10 VITALS — BP 130/72 | HR 76 | Resp 12 | Ht 72.0 in | Wt 191.0 lb

## 2016-06-10 DIAGNOSIS — K4091 Unilateral inguinal hernia, without obstruction or gangrene, recurrent: Secondary | ICD-10-CM

## 2016-06-10 NOTE — Patient Instructions (Addendum)
Return as scheduled for 3-4 week follow-up of RIH repair.

## 2016-06-10 NOTE — Progress Notes (Signed)
Patient ID: Thomas Cross, male   DOB: [birth year], 61 y.o.   MRN: [id number]  Chief Complaint  Patient presents with  . Routine Post Op    HPI Thomas Cross is a 61 y.o. male here today for a knot felt on the same area as surgery site. Pt is post-operative right inguinal hernia repair done on 05/20/2016. I have reviewed the history of present illness with the patient.   HPI  Past Medical History:  Diagnosis Date  . Arthritis   . GERD (gastroesophageal reflux disease)    RARE-TUMS    Past Surgical History:  Procedure Laterality Date  . COLONOSCOPY  2009  . HERNIA REPAIR  30 years ago   right inguinal  . INGUINAL HERNIA REPAIR Right 05/20/2016   Procedure: LAPAROSCOPIC INGUINAL HERNIA;  Surgeon: Christene Lye, MD;  Location: [facility] ORS;  Service: General;  Laterality: Right;  . TONSILLECTOMY AND ADENOIDECTOMY      Family History  Problem Relation Age of Onset  . Heart disease Mother   . Kidney cancer Mother   . Bladder Cancer Mother   . CVA Father   . Prostate cancer Father   . Dementia Father   . Hypertension Father   . Heart attack Brother   . Hyperlipidemia Brother   . Heart disease Maternal Grandfather   . Stroke Paternal Grandmother   . Colon cancer Paternal Grandfather   . Hyperlipidemia Sister   . Hyperlipidemia Brother   . Hyperlipidemia Brother   . Hyperlipidemia Brother     Social History Social History  Substance Use Topics  . Smoking status: Current Every Day Smoker    Packs/day: 1.50    Years: 44.00    Types: Cigarettes  . Smokeless tobacco: Never Used  . Alcohol use 0.0 oz/week     Comment: MIXED DRINKS EVERY DAY    No Known Allergies  Current Outpatient Prescriptions  Medication Sig Dispense Refill  . aspirin EC 81 MG tablet Take 81 mg by mouth every evening.     . calcium carbonate (TUMS - DOSED IN MG ELEMENTAL CALCIUM) 500 MG chewable tablet Chew 1 tablet by mouth as needed for indigestion or heartburn.    . Cholecalciferol 1000  units capsule Take 1,000 Units by mouth every evening.     . Multiple Vitamins-Minerals (MULTIVITAMIN WITH MINERALS) tablet Take 1 tablet by mouth every evening.     . Omega-3 Fatty Acids (FISH OIL) 1000 MG CPDR Take 1 capsule by mouth every evening.     . Vitamins-Lipotropics (LIPOFLAVONOID) TABS Take 1 tablet by mouth every evening.      No current facility-administered medications for this visit.     Review of Systems Review of Systems  Constitutional: Negative.   Respiratory: Negative.   Cardiovascular: Negative.     Blood pressure 130/72, pulse 76, resp. rate 12, height 6' (1.829 m), weight 191 lb (86.6 kg).  Physical Exam Physical Exam  Constitutional: He is oriented to person, place, and time. He appears well-developed and well-nourished.  Abdominal:    Neurological: He is alert and oriented to person, place, and time.  Skin: Skin is warm and dry.    Data Reviewed Notes reviewed   Assessment    Post-operative right inguinal hernia repair.  Knot felt by patient is most likely a post-operative seroma. No evidence of hernia recurrence. Pt reassured.     Plan    Patient to return as scheduled for 3-4 week follow-up of RIH repair.  This information has been scribed by Gaspar Cola CMA.   Shaquasha Gerstel G 06/10/2016, 12:56 PM

## 2016-06-23 ENCOUNTER — Encounter: Payer: BLUE CROSS/BLUE SHIELD | Admitting: Family Medicine

## 2016-06-30 ENCOUNTER — Ambulatory Visit (INDEPENDENT_AMBULATORY_CARE_PROVIDER_SITE_OTHER): Payer: BLUE CROSS/BLUE SHIELD | Admitting: Family Medicine

## 2016-06-30 ENCOUNTER — Encounter: Payer: Self-pay | Admitting: Family Medicine

## 2016-06-30 VITALS — BP 130/86 | HR 62 | Temp 98.2°F | Resp 16 | Ht 71.0 in | Wt 196.2 lb

## 2016-06-30 DIAGNOSIS — Z114 Encounter for screening for human immunodeficiency virus [HIV]: Secondary | ICD-10-CM

## 2016-06-30 DIAGNOSIS — Z8042 Family history of malignant neoplasm of prostate: Secondary | ICD-10-CM | POA: Diagnosis not present

## 2016-06-30 DIAGNOSIS — E7800 Pure hypercholesterolemia, unspecified: Secondary | ICD-10-CM | POA: Diagnosis not present

## 2016-06-30 DIAGNOSIS — R39198 Other difficulties with micturition: Secondary | ICD-10-CM | POA: Diagnosis not present

## 2016-06-30 DIAGNOSIS — E559 Vitamin D deficiency, unspecified: Secondary | ICD-10-CM | POA: Diagnosis not present

## 2016-06-30 DIAGNOSIS — Z0000 Encounter for general adult medical examination without abnormal findings: Secondary | ICD-10-CM | POA: Diagnosis not present

## 2016-06-30 NOTE — Progress Notes (Signed)
Patient: Thomas Cross, Male    DOB: 11-06-1955, 61 y.o.   MRN: [id number] Visit Date: 06/30/2016  Today's Provider: [name], PA   Chief Complaint  Patient presents with  . Annual Exam   Subjective:    Annual physical exam Thomas Cross is a 61 y.o. male who presents today for health maintenance and complete physical. He feels fairly well. He reports exercising none. He reports he is sleeping well.  -----------------------------------------------------------------   Review of Systems  Constitutional: Negative.   HENT: Negative.   Eyes: Negative.   Respiratory: Negative.   Cardiovascular: Negative.   Gastrointestinal: Negative.   Endocrine: Negative.   Genitourinary: Negative.   Musculoskeletal: Negative.   Skin: Negative.   Allergic/Immunologic: Negative.   Neurological: Negative.   Hematological: Negative.   Psychiatric/Behavioral: Negative.     Social History      He  reports that he has been smoking Cigarettes.  He has a 66.00 pack-year smoking history. He has never used smokeless tobacco. He reports that he drinks alcohol. He reports that he does not use drugs.       Social History   Social History  . Marital status: Married    Spouse name: N/A  . Number of children: N/A  . Years of education: N/A   Social History Main Topics  . Smoking status: Current Every Day Smoker    Packs/day: 1.50    Years: 44.00    Types: Cigarettes  . Smokeless tobacco: Never Used  . Alcohol use 0.0 oz/week     Comment: MIXED DRINKS EVERY DAY  . Drug use: No  . Sexual activity: Not Asked   Other Topics Concern  . None   Social History Narrative  . None    Past Medical History:  Diagnosis Date  . Arthritis   . GERD (gastroesophageal reflux disease)    RARE-TUMS     Patient Active Problem List   Diagnosis Date Noted  . Family history of malignant neoplasm of prostate 06/11/2015  . Current tobacco use 06/11/2015  . Avitaminosis D 06/11/2015  .  Hypercholesterolemia without hypertriglyceridemia 12/04/2005    Past Surgical History:  Procedure Laterality Date  . COLONOSCOPY  2009  . HERNIA REPAIR  30 years ago   right inguinal  . INGUINAL HERNIA REPAIR Right 05/20/2016   Procedure: LAPAROSCOPIC INGUINAL HERNIA;  Surgeon: Christene Lye, MD;  Location: [facility] ORS;  Service: General;  Laterality: Right;  . TONSILLECTOMY AND ADENOIDECTOMY      Family History        Family Status  Relation Status  . Mother Deceased  . Father Deceased  . Brother Deceased  . Maternal Grandfather Deceased  . Paternal Grandmother Deceased  . Paternal Grandfather Deceased  . Sister   . Brother   . Brother   . Brother         His family history includes Bladder Cancer in his mother; CVA in his father; Colon cancer in his paternal grandfather; Dementia in his father; Heart attack in his brother; Heart disease in his maternal grandfather and mother; Hyperlipidemia in his brother, brother, brother, brother, and sister; Hypertension in his father; Kidney cancer in his mother; Prostate cancer in his father; Stroke in his paternal grandmother.     No Known Allergies   Current Outpatient Prescriptions:  .  aspirin EC 81 MG tablet, Take 81 mg by mouth every evening. , Disp: , Rfl:  .  calcium carbonate (TUMS -  DOSED IN MG ELEMENTAL CALCIUM) 500 MG chewable tablet, Chew 1 tablet by mouth as needed for indigestion or heartburn., Disp: , Rfl:  .  Cholecalciferol 1000 units capsule, Take 1,000 Units by mouth every evening. , Disp: , Rfl:  .  Multiple Vitamins-Minerals (MULTIVITAMIN WITH MINERALS) tablet, Take 1 tablet by mouth every evening. , Disp: , Rfl:  .  Omega-3 Fatty Acids (FISH OIL) 1000 MG CPDR, Take 1 capsule by mouth every evening. , Disp: , Rfl:  .  Vitamins-Lipotropics (LIPOFLAVONOID) TABS, Take 1 tablet by mouth every evening. , Disp: , Rfl:    Patient Care Team: Margo Common, PA as PCP - General (Family Medicine) Margo Common,  PA (Family Medicine) Seeplaputhur Robinette Haines, MD (General Surgery)      Objective:   Vitals: BP 130/86 (BP Location: Right Arm, Patient Position: Sitting, Cuff Size: Normal)   Pulse 62   Temp 98.2 F (36.8 C) (Oral)   Resp 16   Ht 5\' 11"  (1.803 m)   Wt 196 lb 3.2 oz (89 kg)   SpO2 99%   BMI 27.36 kg/m    Physical Exam  Constitutional: He is oriented to person, place, and time. He appears well-developed and well-nourished.  HENT:  Head: Normocephalic and atraumatic.  Right Ear: External ear normal.  Left Ear: External ear normal.  Nose: Nose normal.  Mouth/Throat: Oropharynx is clear and moist.  Eyes: Conjunctivae and EOM are normal. Pupils are equal, round, and reactive to light. Right eye exhibits no discharge.  Neck: Normal range of motion. Neck supple. No tracheal deviation present. No thyromegaly present.  Cardiovascular: Normal rate, regular rhythm, normal heart sounds and intact distal pulses.   No murmur heard. Pulmonary/Chest: Effort normal and breath sounds normal. No respiratory distress. He has no wheezes. He has no rales. He exhibits no tenderness.  Abdominal: Soft. Bowel sounds are normal. He exhibits no distension and no mass. There is no tenderness. There is no rebound and no guarding.  Genitourinary: Rectum normal, prostate normal and penis normal.  Genitourinary Comments: Questionably positive hemoccult slide. Will recheck with [facility] from home.  Musculoskeletal: Normal range of motion. He exhibits no edema or tenderness.  Lymphadenopathy:    He has no cervical adenopathy.  Neurological: He is alert and oriented to person, place, and time. He has normal reflexes. No cranial nerve deficit. He exhibits normal muscle tone. Coordination normal.  Skin: Skin is warm and dry. No rash noted. No erythema.  Psychiatric: He has a normal mood and affect. His behavior is normal. Judgment and thought content normal.     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      Immunization History  Administered Date(s) Administered  . Tdap 03/18/2011    Health Maintenance  Topic Date Due  . HIV Screening  10/03/1968  . ZOSTAVAX  10/03/2013  . INFLUENZA VACCINE  03/06/2017 (Originally 12/18/2015)  . COLONOSCOPY  11/01/2017  . TETANUS/TDAP  03/17/2021  . Hepatitis C Screening  Completed     Discussed health benefits of physical activity, and encouraged him to engage in regular exercise appropriate for his age and condition.    --------------------------------------------------------------------  1. Annual physical exam General health stable. Will check labs and given  anticipatory counseling. - CBC with Differential/Platelet - PSA  2. Hypercholesterolemia without hypertriglyceridemia Still trying to follow low fat diet and using Omega-3 Fish Oil 1000 mg qd. Recheck labs. - CBC with Differential/Platelet - Comprehensive metabolic panel - Lipid panel - TSH  3. Family history of malignant neoplasm of prostate Father had prostate cancer. Patient having some decrease in stream but no nocturia. - PSA  4. Avitaminosis D Still taking Vitamin-D 1000 IU daily. Will recheck blood levels. Still smoking. - VITAMIN D 25 Hydroxy (Vit-D Deficiency, Fractures)  5. Decreased urine stream No nocturia. Had a right hernia repair by Dr. Bary Castilla and he had difficulty trying to put in a urinary catheter. Recommended he have PSA. DRE did not show any masses or nodules.  Family history positive for prostate disease in father. - CBC with Differential/Platelet - PSA  6. Screening for HIV (human immunodeficiency virus - HIV antibody   [name], PA  [facility]

## 2016-07-01 ENCOUNTER — Encounter: Payer: Self-pay | Admitting: General Surgery

## 2016-07-01 ENCOUNTER — Ambulatory Visit (INDEPENDENT_AMBULATORY_CARE_PROVIDER_SITE_OTHER): Payer: BLUE CROSS/BLUE SHIELD | Admitting: General Surgery

## 2016-07-01 VITALS — BP 122/70 | HR 72 | Resp 12 | Ht 72.0 in | Wt 199.0 lb

## 2016-07-01 DIAGNOSIS — K4091 Unilateral inguinal hernia, without obstruction or gangrene, recurrent: Secondary | ICD-10-CM

## 2016-07-01 NOTE — Patient Instructions (Signed)
The patient is aware to call back for any questions or concerns.  

## 2016-07-01 NOTE — Progress Notes (Signed)
Patient ID: Thomas Cross, male   DOB: Sep 17, 1955, 62 y.o.   MRN: [id number]  Chief Complaint  Patient presents with  . Routine Post Op    HPI Thomas Cross is a 61 y.o. male.  Here today for postoperative visit, right inguinal hernia on 05-20-16, he states he is doing well. Denies any gastrointestinal issues, bowels are moving regular. I have reviewed the history of present illness with the patient.   HPI  Past Medical History:  Diagnosis Date  . Arthritis   . GERD (gastroesophageal reflux disease)    RARE-TUMS    Past Surgical History:  Procedure Laterality Date  . COLONOSCOPY  2009  . HERNIA REPAIR  30 years ago   right inguinal  . INGUINAL HERNIA REPAIR Right 05/20/2016   Procedure: LAPAROSCOPIC INGUINAL HERNIA;  Surgeon: Christene Lye, MD;  Location: [facility] ORS;  Service: General;  Laterality: Right;  . TONSILLECTOMY AND ADENOIDECTOMY      Family History  Problem Relation Age of Onset  . Heart disease Mother   . Kidney cancer Mother   . Bladder Cancer Mother   . CVA Father   . Prostate cancer Father   . Dementia Father   . Hypertension Father   . Heart attack Brother   . Hyperlipidemia Brother   . Heart disease Maternal Grandfather   . Stroke Paternal Grandmother   . Colon cancer Paternal Grandfather   . Hyperlipidemia Sister   . Hyperlipidemia Brother   . Hyperlipidemia Brother   . Hyperlipidemia Brother     Social History Social History  Substance Use Topics  . Smoking status: Current Every Day Smoker    Packs/day: 1.50    Years: 44.00    Types: Cigarettes  . Smokeless tobacco: Never Used  . Alcohol use 0.0 oz/week     Comment: MIXED DRINKS EVERY DAY    No Known Allergies  Current Outpatient Prescriptions  Medication Sig Dispense Refill  . aspirin EC 81 MG tablet Take 81 mg by mouth every evening.     . calcium carbonate (TUMS - DOSED IN MG ELEMENTAL CALCIUM) 500 MG chewable tablet Chew 1 tablet by mouth as needed for indigestion or  heartburn.    . Cholecalciferol 1000 units capsule Take 1,000 Units by mouth every evening.     . Multiple Vitamins-Minerals (MULTIVITAMIN WITH MINERALS) tablet Take 1 tablet by mouth every evening.     . Omega-3 Fatty Acids (FISH OIL) 1000 MG CPDR Take 1 capsule by mouth every evening.     . Vitamins-Lipotropics (LIPOFLAVONOID) TABS Take 1 tablet by mouth every evening.      No current facility-administered medications for this visit.     Review of Systems Review of Systems  Constitutional: Negative.   Respiratory: Negative.   Cardiovascular: Negative.     Blood pressure 122/70, pulse 72, resp. rate 12, height 6' (1.829 m), weight 199 lb (90.3 kg).  Physical Exam Physical Exam  Constitutional: He is oriented to person, place, and time. He appears well-developed and well-nourished.  Abdominal: Soft. Normal appearance and bowel sounds are normal. There is no tenderness. No hernia. Hernia confirmed negative in the right inguinal area and confirmed negative in the left inguinal area.  Right inguinal repair intact, port sites clean. Seroma right pubic area seems almost fully resolved.  Neurological: He is alert and oriented to person, place, and time.  Skin: Skin is warm and dry.  Psychiatric: His behavior is normal.    Data Reviewed  Progress  notes  Assessment    Stable postoperative exam.   Plan    Follow up as needed. The patient is aware to call back for any questions or concerns.       This information has been scribed by Karie Fetch RN, BSN,BC.   Thomas Cross,Thomas Cross 07/01/2016, 1:32 PM

## 2016-07-03 LAB — TSH: TSH: 2.04 u[IU]/mL (ref 0.450–4.500)

## 2016-07-03 LAB — HIV ANTIBODY (ROUTINE TESTING W REFLEX): HIV Screen 4th Generation wRfx: NONREACTIVE

## 2016-07-03 LAB — COMPREHENSIVE METABOLIC PANEL
ALT: 14 IU/L (ref 0–44)
AST: 14 IU/L (ref 0–40)
Albumin/Globulin Ratio: 1.7 (ref 1.2–2.2)
Albumin: 4.3 g/dL (ref 3.6–4.8)
Alkaline Phosphatase: 131 IU/L — ABNORMAL HIGH (ref 39–117)
BUN/Creatinine Ratio: 19 (ref 10–24)
BUN: 17 mg/dL (ref 8–27)
Bilirubin Total: 0.4 mg/dL (ref 0.0–1.2)
CALCIUM: 9.1 mg/dL (ref 8.6–10.2)
CO2: 23 mmol/L (ref 18–29)
Chloride: 104 mmol/L (ref 96–106)
Creatinine, Ser: 0.89 mg/dL (ref 0.76–1.27)
GFR, EST AFRICAN AMERICAN: 106 mL/min/{1.73_m2} (ref >59)
GFR, EST NON AFRICAN AMERICAN: 92 mL/min/{1.73_m2} (ref >59)
GLUCOSE: 90 mg/dL (ref 65–99)
Globulin, Total: 2.5 g/dL (ref 1.5–4.5)
Potassium: 4.7 mmol/L (ref 3.5–5.2)
Sodium: 143 mmol/L (ref 134–144)
TOTAL PROTEIN: 6.8 g/dL (ref 6.0–8.5)

## 2016-07-03 LAB — LIPID PANEL
CHOL/HDL RATIO: 5.9 ratio — AB (ref 0.0–5.0)
Cholesterol, Total: 205 mg/dL — ABNORMAL HIGH (ref 100–199)
HDL: 35 mg/dL — AB (ref >39)
LDL CALC: 145 mg/dL — AB (ref 0–99)
TRIGLYCERIDES: 123 mg/dL (ref 0–149)
VLDL Cholesterol Cal: 25 mg/dL (ref 5–40)

## 2016-07-03 LAB — VITAMIN D 25 HYDROXY (VIT D DEFICIENCY, FRACTURES): Vit D, 25-Hydroxy: 34 ng/mL (ref 30.0–100.0)

## 2016-07-03 LAB — CBC WITH DIFFERENTIAL/PLATELET
BASOS ABS: 0 10*3/uL (ref 0.0–0.2)
BASOS: 0 %
EOS (ABSOLUTE): 0.2 10*3/uL (ref 0.0–0.4)
Eos: 3 %
Hematocrit: 44.5 % (ref 37.5–51.0)
Hemoglobin: 15.2 g/dL (ref 13.0–17.7)
IMMATURE GRANULOCYTES: 0 %
Immature Grans (Abs): 0 10*3/uL (ref 0.0–0.1)
Lymphocytes Absolute: 1.6 10*3/uL (ref 0.7–3.1)
Lymphs: 20 %
MCH: 33 pg (ref 26.6–33.0)
MCHC: 34.2 g/dL (ref 31.5–35.7)
MCV: 97 fL (ref 79–97)
MONOS ABS: 0.7 10*3/uL (ref 0.1–0.9)
Monocytes: 9 %
NEUTROS PCT: 68 %
Neutrophils Absolute: 5.4 10*3/uL (ref 1.4–7.0)
PLATELETS: 290 10*3/uL (ref 150–379)
RBC: 4.61 x10E6/uL (ref 4.14–5.80)
RDW: 12.8 % (ref 12.3–15.4)
WBC: 8 10*3/uL (ref 3.4–10.8)

## 2016-07-03 LAB — PSA: Prostate Specific Ag, Serum: 0.2 ng/mL (ref 0.0–4.0)

## 2016-07-04 ENCOUNTER — Telehealth: Payer: Self-pay

## 2016-07-04 NOTE — Telephone Encounter (Signed)
Patient was advised of lab report he has declined at this time starting a statin drug and states that he will call office back if he chooses to start. KW

## 2016-07-04 NOTE — Telephone Encounter (Signed)
[id number] 

## 2016-07-04 NOTE — Telephone Encounter (Signed)
-----   Message from [place], [place] sent at 07/04/2016 11:41 AM EST ----- Normal vitamin-D level, thyroid function test, prostate test, kidney function and no anemia. Cholesterol abnormal (total and LDL high with low HDL). Recommend Pravastatin 20 mg qd #30 & 3RF. Recheck levels in 3 months. Follow a low fat diet with increase in water intake.

## 2016-07-09 ENCOUNTER — Telehealth: Payer: Self-pay | Admitting: *Deleted

## 2016-07-09 NOTE — Telephone Encounter (Signed)
Patients wife called and stated that she got a bill for Thomas Cross's surgery and insurance did not pay any of it. She called her insurance company and they told her that on the claim it was showing [name] (Wife) as the patient but for the date of birth it was Nour's. Patient is aware of that you will be back in the office next week.

## 2016-12-01 ENCOUNTER — Encounter: Payer: Self-pay | Admitting: Unknown Physician Specialty

## 2016-12-01 ENCOUNTER — Ambulatory Visit (INDEPENDENT_AMBULATORY_CARE_PROVIDER_SITE_OTHER): Payer: Self-pay | Admitting: Unknown Physician Specialty

## 2016-12-01 VITALS — BP 124/76 | HR 71 | Temp 98.7°F | Ht 70.0 in | Wt 193.5 lb

## 2016-12-01 DIAGNOSIS — Z024 Encounter for examination for driving license: Secondary | ICD-10-CM

## 2016-12-01 NOTE — Progress Notes (Signed)
BP 124/76 (BP Location: Left Arm, Cuff Size: Large)   Pulse 71   Temp 98.7 F (37.1 C)   Ht 5\' 10"  (1.778 m)   Wt 193 lb 8 oz (87.8 kg)   SpO2 96%   BMI 27.76 kg/m    Subjective:    Patient ID: Thomas Cross, male    DOB: [birth year], 61 y.o.   MRN: [id number]  HPI: Thomas Cross is a 61 y.o. male  Chief Complaint  Patient presents with  . 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  He is aware it's about time to get one.  I've reviewed his records from [facility].  No medications except supplements.    Relevant past medical, surgical, family and social history reviewed and updated as indicated. Interim medical history since our last visit reviewed. Allergies and medications reviewed and updated.  Review of Systems  Constitutional: Negative.   HENT: Negative.   Eyes: Negative.   Respiratory: Negative.   Cardiovascular: Negative.   Gastrointestinal: Negative.   Endocrine: Negative.   Genitourinary: Negative.   Musculoskeletal: Negative.   Skin: Negative.   Allergic/Immunologic: Negative.   Neurological: Negative.   Hematological: Negative.   Psychiatric/Behavioral: Negative.     Per HPI unless specifically indicated above     Objective:    BP 124/76 (BP Location: Left Arm, Cuff Size: Large)   Pulse 71   Temp 98.7 F (37.1 C)   Ht 5\' 10"  (1.778 m)   Wt 193 lb 8 oz (87.8 kg)   SpO2 96%   BMI 27.76 kg/m   Wt Readings from Last 3 Encounters:  12/01/16 193 lb 8 oz (87.8 kg)  07/01/16 199 lb (90.3 kg)  06/30/16 196 lb 3.2 oz (89 kg)    Physical Exam (See form)  Results for orders placed or performed in visit on 06/30/16  CBC with Differential/Platelet  Result Value Ref Range   WBC 8.0 3.4 - 10.8 x10E3/uL   RBC 4.61 4.14 - 5.80 x10E6/uL   Hemoglobin 15.2 13.0 - 17.7 g/dL   Hematocrit 44.5 37.5 - 51.0 %   MCV 97 79 - 97 fL   MCH 33.0 26.6 - 33.0 pg   MCHC 34.2 31.5 - 35.7 g/dL   RDW 12.8 12.3 - 15.4 %   Platelets 290 150 - 379 x10E3/uL    Neutrophils 68 Not Estab. %   Lymphs 20 Not Estab. %   Monocytes 9 Not Estab. %   Eos 3 Not Estab. %   Basos 0 Not Estab. %   Neutrophils Absolute 5.4 1.4 - 7.0 x10E3/uL   Lymphocytes Absolute 1.6 0.7 - 3.1 x10E3/uL   Monocytes Absolute 0.7 0.1 - 0.9 x10E3/uL   EOS (ABSOLUTE) 0.2 0.0 - 0.4 x10E3/uL   Basophils Absolute 0.0 0.0 - 0.2 x10E3/uL   Immature Granulocytes 0 Not Estab. %   Immature Grans (Abs) 0.0 0.0 - 0.1 x10E3/uL  Comprehensive metabolic panel  Result Value Ref Range   Glucose 90 65 - 99 mg/dL   BUN 17 8 - 27 mg/dL   Creatinine, Ser 0.89 0.76 - 1.27 mg/dL   GFR calc non Af Amer 92 >59 mL/min/1.73   GFR calc Af Amer 106 >59 mL/min/1.73   BUN/Creatinine Ratio 19 10 - 24   Sodium 143 134 - 144 mmol/L   Potassium 4.7 3.5 - 5.2 mmol/L   Chloride 104 96 - 106 mmol/L   CO2 23 18 - 29 mmol/L   Calcium 9.1  8.6 - 10.2 mg/dL   Total Protein 6.8 6.0 - 8.5 g/dL   Albumin 4.3 3.6 - 4.8 g/dL   Globulin, Total 2.5 1.5 - 4.5 g/dL   Albumin/Globulin Ratio 1.7 1.2 - 2.2   Bilirubin Total 0.4 0.0 - 1.2 mg/dL   Alkaline Phosphatase 131 (H) 39 - 117 IU/L   AST 14 0 - 40 IU/L   ALT 14 0 - 44 IU/L  HIV antibody  Result Value Ref Range   HIV Screen 4th Generation wRfx Non Reactive Non Reactive  Lipid panel  Result Value Ref Range   Cholesterol, Total 205 (H) 100 - 199 mg/dL   Triglycerides 123 0 - 149 mg/dL   HDL 35 (L) >39 mg/dL   VLDL Cholesterol Cal 25 5 - 40 mg/dL   LDL Calculated 145 (H) 0 - 99 mg/dL   Chol/HDL Ratio 5.9 (H) 0.0 - 5.0 ratio units  PSA  Result Value Ref Range   Prostate Specific Ag, Serum 0.2 0.0 - 4.0 ng/mL  TSH  Result Value Ref Range   TSH 2.040 0.450 - 4.500 uIU/mL  VITAMIN D 25 Hydroxy (Vit-D Deficiency, Fractures)  Result Value Ref Range   Vit D, 25-Hydroxy 34.0 30.0 - 100.0 ng/mL      Assessment & Plan:   Problem List Items Addressed This Visit    None      See form.  OK for 2 years  Follow up plan: No Follow-up on file.

## 2017-04-07 ENCOUNTER — Encounter: Payer: Self-pay | Admitting: Family Medicine

## 2017-04-07 ENCOUNTER — Ambulatory Visit
Admission: RE | Admit: 2017-04-07 | Discharge: 2017-04-07 | Disposition: A | Discharge status: Home / Self Care | Payer: BLUE CROSS/BLUE SHIELD | Source: Ambulatory Visit | Attending: Family Medicine | Admitting: Family Medicine

## 2017-04-07 ENCOUNTER — Ambulatory Visit (INDEPENDENT_AMBULATORY_CARE_PROVIDER_SITE_OTHER): Payer: BLUE CROSS/BLUE SHIELD | Admitting: Family Medicine

## 2017-04-07 VITALS — BP 122/78 | HR 63 | Temp 98.6°F | Wt 202.8 lb

## 2017-04-07 DIAGNOSIS — R079 Chest pain, unspecified: Secondary | ICD-10-CM

## 2017-04-07 DIAGNOSIS — Z720 Tobacco use: Secondary | ICD-10-CM | POA: Insufficient documentation

## 2017-04-07 DIAGNOSIS — E7800 Pure hypercholesterolemia, unspecified: Secondary | ICD-10-CM | POA: Diagnosis not present

## 2017-04-07 NOTE — Progress Notes (Signed)
Patient: Thomas Cross Male    DOB: Apr 11, 1956   61 y.o.   MRN: [id number] Visit Date: 04/07/2017  Today's Provider: [name], PA   Chief Complaint  Patient presents with  . Chest Pain   Subjective:    Chest Pain   This is a new problem. Episode onset: last Friday. The onset quality is sudden. The problem occurs intermittently. The quality of the pain is described as pressure. The pain radiates to the left arm. Risk factors include male gender and smoking/tobacco exposure.  His past medical history is significant for hyperlipidemia.  His family medical history is significant for heart disease.      Past Medical History:  Diagnosis Date  . Arthritis   . GERD (gastroesophageal reflux disease)    RARE-TUMS   Past Surgical History:  Procedure Laterality Date  . COLONOSCOPY  2009  . HERNIA REPAIR  30 years ago   right inguinal  . LAPAROSCOPIC INGUINAL HERNIA Right 05/20/2016   Performed by Christene Lye, MD at [facility] ORS  . TONSILLECTOMY AND ADENOIDECTOMY     Family History  Problem Relation Age of Onset  . Heart disease Mother   . Kidney cancer Mother   . Bladder Cancer Mother   . CVA Father   . Prostate cancer Father   . Dementia Father   . Hypertension Father   . Heart attack Brother   . Hyperlipidemia Brother   . Heart disease Maternal Grandfather   . Stroke Paternal Grandmother   . Colon cancer Paternal Grandfather   . Hyperlipidemia Sister   . Hyperlipidemia Brother   . Hyperlipidemia Brother   . Hyperlipidemia Brother    No Known Allergies  Current Outpatient Medications:  .  aspirin EC 81 MG tablet, Take 81 mg by mouth every evening. , Disp: , Rfl:  .  calcium carbonate (TUMS - DOSED IN MG ELEMENTAL CALCIUM) 500 MG chewable tablet, Chew 1 tablet by mouth as needed for indigestion or heartburn., Disp: , Rfl:  .  Cholecalciferol 1000 units capsule, Take 1,000 Units by mouth every evening. , Disp: , Rfl:  .  Multiple Vitamins-Minerals  (MULTIVITAMIN WITH MINERALS) tablet, Take 1 tablet by mouth every evening. , Disp: , Rfl:  .  Omega-3 Fatty Acids (FISH OIL) 1000 MG CPDR, Take 1 capsule by mouth every evening. , Disp: , Rfl:  .  Vitamins-Lipotropics (LIPOFLAVONOID) TABS, Take 1 tablet by mouth every evening. , Disp: , Rfl:   Review of Systems  Constitutional: Negative.   Respiratory: Negative.   Cardiovascular: Positive for chest pain.   Social History   Tobacco Use  . Smoking status: Current Every Day Smoker    Packs/day: 1.50    Years: 44.00    Pack years: 66.00    Types: Cigarettes  . Smokeless tobacco: Never Used  Substance Use Topics  . Alcohol use: Yes    Alcohol/week: 0.0 oz    Comment: MIXED DRINKS EVERY DAY   Objective:   BP 122/78 (BP Location: Right Arm, Patient Position: Sitting, Cuff Size: Normal)   Pulse 63   Temp 98.6 F (37 C) (Oral)   Wt 202 lb 12.8 oz (92 kg)   SpO2 97%   BMI 29.10 kg/m    Physical Exam  Constitutional: He is oriented to person, place, and time. He appears well-developed and well-nourished. No distress.  HENT:  Head: Normocephalic and atraumatic.  Right Ear: Hearing and external ear normal.  Left Ear: Hearing and external  ear normal.  Nose: Nose normal.  Eyes: Conjunctivae and lids are normal. Right eye exhibits no discharge. Left eye exhibits no discharge. No scleral icterus.  Neck: Neck supple.  Cardiovascular: Normal rate and regular rhythm.  Pulmonary/Chest: Effort normal and breath sounds normal. No respiratory distress. He has no wheezes. He has no rales. He exhibits no tenderness.  Abdominal: Soft. Bowel sounds are normal.  Musculoskeletal: Normal range of motion.  Neurological: He is alert and oriented to person, place, and time.  Skin: Skin is intact. No lesion and no rash noted.  Psychiatric: He has a normal mood and affect. His speech is normal and behavior is normal. Thought content normal.      Assessment & Plan:      1. Chest pain, unspecified  type Onset 03-27-17 without specific injury or association with exertion. No palpitations, dyspnea, swelling or dizziness. EKG NSR. Continues to smoke 1.5 ppd for the past 44 years. Denies flare of GERD in several weeks. Will check labs. Follow up pending reports. May need to go to ER if pain worsens (describes it as a "soreness" today - not pressure). - EKG 12-Lead - COMPLETE METABOLIC PANEL WITH GFR - CBC with Differential/Platelet - Troponin I - CK (Creatine Kinase) - Sedimentation rate - DG Chest 2 View  2. Hypercholesterolemia without hypertriglyceridemia Trying to control fats in diet. Will check labs for any sign of rhabdomyolysis creating chest muscle pains. - CBC with Differential/Platelet - Lipid panel - CK (Creatine Kinase)  3. Current tobacco use Smoking 1.5 ppd for 44 years. Counseled regarding smoking cessation. Check CXR. - DG Chest 2 View       [name], [facility]

## 2017-04-09 LAB — COMPLETE METABOLIC PANEL WITH GFR
AG RATIO: 1.7 (calc) (ref 1.0–2.5)
ALT: 13 U/L (ref 9–46)
AST: 17 U/L (ref 10–35)
Albumin: 4 g/dL (ref 3.6–5.1)
Alkaline phosphatase (APISO): 112 U/L (ref 40–115)
BUN: 11 mg/dL (ref 7–25)
CALCIUM: 8.8 mg/dL (ref 8.6–10.3)
CO2: 26 mmol/L (ref 20–32)
CREATININE: 0.95 mg/dL (ref 0.70–1.25)
Chloride: 106 mmol/L (ref 98–110)
GFR, EST AFRICAN AMERICAN: 98 mL/min/{1.73_m2} (ref >=60)
GFR, EST NON AFRICAN AMERICAN: 85 mL/min/{1.73_m2} (ref >=60)
Globulin: 2.4 g/dL (calc) (ref 1.9–3.7)
Glucose, Bld: 87 mg/dL (ref 65–99)
Potassium: 4.3 mmol/L (ref 3.5–5.3)
Sodium: 140 mmol/L (ref 135–146)
Total Bilirubin: 0.7 mg/dL (ref 0.2–1.2)
Total Protein: 6.4 g/dL (ref 6.1–8.1)

## 2017-04-09 LAB — CBC WITH DIFFERENTIAL/PLATELET
BASOS PCT: 0.6 %
Basophils Absolute: 43 cells/uL (ref 0–200)
Eosinophils Absolute: 142 cells/uL (ref 15–500)
Eosinophils Relative: 2 %
HEMATOCRIT: 43.1 % (ref 38.5–50.0)
Hemoglobin: 15.1 g/dL (ref 13.2–17.1)
Lymphs Abs: 1377 cells/uL (ref 850–3900)
MCH: 33.5 pg — ABNORMAL HIGH (ref 27.0–33.0)
MCHC: 35 g/dL (ref 32.0–36.0)
MCV: 95.6 fL (ref 80.0–100.0)
MPV: 9.1 fL (ref 7.5–12.5)
Monocytes Relative: 9.3 %
NEUTROS PCT: 68.7 %
Neutro Abs: 4878 cells/uL (ref 1500–7800)
Platelets: 245 10*3/uL (ref 140–400)
RBC: 4.51 10*6/uL (ref 4.20–5.80)
RDW: 12 % (ref 11.0–15.0)
Total Lymphocyte: 19.4 %
WBC mixed population: 660 cells/uL (ref 200–950)
WBC: 7.1 10*3/uL (ref 3.8–10.8)

## 2017-04-09 LAB — LIPID PANEL
Cholesterol: 195 mg/dL (ref <200)
HDL: 38 mg/dL — ABNORMAL LOW (ref >40)
LDL CHOLESTEROL (CALC): 140 mg/dL — AB
NON-HDL CHOLESTEROL (CALC): 157 mg/dL — AB (ref <130)
TRIGLYCERIDES: 77 mg/dL (ref <150)
Total CHOL/HDL Ratio: 5.1 (calc) — ABNORMAL HIGH (ref <5.0)

## 2017-04-09 LAB — TROPONIN I: Troponin I: <0.01 ng/mL (ref <=0.0)

## 2017-04-09 LAB — SEDIMENTATION RATE: SED RATE: 11 mm/h (ref 0–20)

## 2017-04-09 LAB — CK: Total CK: 80 U/L (ref 44–196)

## 2017-04-13 ENCOUNTER — Telehealth: Payer: Self-pay

## 2017-04-13 NOTE — Telephone Encounter (Signed)
-----   Message from [place], [place] sent at 04/10/2017  8:41 AM EST ----- All blood tests, EKG and CXR are normal except LDL cholesterol elevation (better than in February). No sign of infection, muscle damage or heart disease. If any difficulty with breathing, will need Spiriva or Anoro inhalers with recheck in 1 month.

## 2017-04-13 NOTE — Telephone Encounter (Signed)
Patient advised. He denies any difficulty with breathing or SOB at this time. Advised patient if the above symptoms start call for RX for the inhaler. He verbalized understanding.

## 2017-04-13 NOTE — Telephone Encounter (Signed)
-----   Message from Margo Common, [place] sent at 04/07/2017  1:55 PM EST ----- Awaiting final lab reports. Chest x-ray confirms hyperinflated lungs consistent with COPD and smoking changes. No sign of pneumonia or enlarged heart.

## 2017-07-08 ENCOUNTER — Encounter: Payer: Self-pay | Admitting: Family Medicine

## 2017-07-08 ENCOUNTER — Ambulatory Visit (INDEPENDENT_AMBULATORY_CARE_PROVIDER_SITE_OTHER): Payer: BLUE CROSS/BLUE SHIELD | Admitting: Family Medicine

## 2017-07-08 VITALS — BP 124/60 | HR 58 | Temp 97.9°F | Resp 16 | Wt 203.0 lb

## 2017-07-08 DIAGNOSIS — J449 Chronic obstructive pulmonary disease, unspecified: Secondary | ICD-10-CM | POA: Diagnosis not present

## 2017-07-08 DIAGNOSIS — R0989 Other specified symptoms and signs involving the circulatory and respiratory systems: Secondary | ICD-10-CM

## 2017-07-08 DIAGNOSIS — F17210 Nicotine dependence, cigarettes, uncomplicated: Secondary | ICD-10-CM | POA: Diagnosis not present

## 2017-07-08 DIAGNOSIS — R079 Chest pain, unspecified: Secondary | ICD-10-CM

## 2017-07-08 NOTE — Progress Notes (Signed)
Patient walked in and reports that last night he coughed one "big" time and then felt like "something" was running down his abdomen. He then had numbness and coldness in both feet. He states his arms felt "weird" afterwards also. He reports symptoms lasted for 30 minutes. He states he almost called EMS but decided against it. He was also afraid to go to sleep last night he states. He is a [employer] patient. Discussed symptoms with Dr. Jacinto Reap. She recommended that he be seen here since he walked in okay. First available appointment was at 9:00 am with Dr. Caryn Section.

## 2017-07-08 NOTE — Progress Notes (Signed)
Patient: Thomas Cross Male    DOB: [birth year]   62 y.o.   MRN: [id number] Visit Date: 07/08/2017  Today's Provider: Lelon Huh, MD   Chief Complaint  Patient presents with  . Numbness   Subjective:    HPI  Patient walked in to office and reports that last night he coughed one "big" time and then felt like "something" was running down his abdomen. He then had numbness and coldness in both feet. He states his arms felt "weird" afterwards also. Also had blurry vision for several minutes afterwards. Patient reports having slight pain right side of his  chest. He reports symptoms lasted for 30 minutes. He states he almost called EMS but decided against it. He was also afraid to go to sleep last night he states. He is a Recruitment consultant patient. Patient has had cough and chest congestion for several months. Patient states is productive at times. Had chest XR showing COPD changes in November.   No Known Allergies   Current Outpatient Medications:  .  aspirin EC 81 MG tablet, Take 81 mg by mouth every evening. , Disp: , Rfl:  .  calcium carbonate (TUMS - DOSED IN MG ELEMENTAL CALCIUM) 500 MG chewable tablet, Chew 1 tablet by mouth as needed for indigestion or heartburn., Disp: , Rfl:  .  Cholecalciferol 1000 units capsule, Take 1,000 Units by mouth every evening. , Disp: , Rfl:  .  Multiple Vitamins-Minerals (MULTIVITAMIN WITH MINERALS) tablet, Take 1 tablet by mouth every evening. , Disp: , Rfl:  .  Omega-3 Fatty Acids (FISH OIL) 1000 MG CPDR, Take 1 capsule by mouth every evening. , Disp: , Rfl:  .  Vitamins-Lipotropics (LIPOFLAVONOID) TABS, Take 1 tablet by mouth every evening. , Disp: , Rfl:   Review of Systems  Constitutional: Negative for appetite change, chills and fever.  HENT: Positive for congestion.   Respiratory: Positive for cough and chest tightness. Negative for shortness of breath and wheezing.   Cardiovascular: Negative for chest pain and palpitations.  Gastrointestinal:  Negative for abdominal pain, nausea and vomiting.  Neurological: Positive for numbness.    Social History   Tobacco Use  . Smoking status: Current Every Day Smoker    Packs/day: 1.50    Years: 44.00    Pack years: 66.00    Types: Cigarettes  . Smokeless tobacco: Never Used  Substance Use Topics  . Alcohol use: Yes    Alcohol/week: 0.0 oz    Comment: MIXED DRINKS EVERY DAY   Objective:   BP 124/60 (BP Location: Right Arm, Patient Position: Sitting, Cuff Size: Large)   Pulse (!) 58   Temp 97.9 F (36.6 C) (Oral)   Resp 16   Wt 203 lb (92.1 kg)   SpO2 99%   BMI 29.13 kg/m  Vitals:   07/08/17 0905  BP: 124/60  Pulse: (!) 58  Resp: 16  Temp: 97.9 F (36.6 C)  TempSrc: Oral  SpO2: 99%  Weight: 203 lb (92.1 kg)     Physical Exam  General Appearance:    Alert, cooperative, no distress  Eyes:    PERRL, conjunctiva/corneas clear, EOM's intact       Lungs:     Clear to auscultation bilaterally, respirations unlabored  Heart:    Regular rate and rhythm, no carotid bruits auscultated.   Abdomen:   bowel sounds present and normal in all 4 quadrants, soft, round, nontender faint abdominal bruit on exam.    EKG  Assessment & Plan:     1. Chest pain, unspecified type Not exertional. Not suggestive of cardiac angina.  - EKG 12-Lead  2. Abdominal bruit  - US Abdomen Complete; Future  3. Smoking greater than 30 pack years  - Ambulatory Referral for Lung Cancer Scre  4. Chronic obstructive pulmonary disease, unspecified COPD type ([place]) (per chest XR November 2018)  Consider spirometry     Lelon Huh, MD  [facility]

## 2017-07-09 ENCOUNTER — Telehealth: Payer: Self-pay | Admitting: Family Medicine

## 2017-07-09 NOTE — Telephone Encounter (Signed)
Thomas Cross needs clarification on the order that was placed for Thomas Cross for Monday for abdominal complete

## 2017-07-09 NOTE — Telephone Encounter (Signed)
Lilia Pro will change order to aorta complete.

## 2017-07-09 NOTE — Telephone Encounter (Signed)
Tried calling morgan back, no answer. Will try again later.

## 2017-07-10 ENCOUNTER — Ambulatory Visit
Admission: RE | Admit: 2017-07-10 | Discharge: 2017-07-10 | Disposition: A | Discharge status: Home / Self Care | Payer: BLUE CROSS/BLUE SHIELD | Source: Ambulatory Visit | Attending: Family Medicine | Admitting: Family Medicine

## 2017-07-10 DIAGNOSIS — R0989 Other specified symptoms and signs involving the circulatory and respiratory systems: Secondary | ICD-10-CM | POA: Insufficient documentation

## 2017-07-14 ENCOUNTER — Telehealth: Payer: Self-pay | Admitting: *Deleted

## 2017-07-14 DIAGNOSIS — Z122 Encounter for screening for malignant neoplasm of respiratory organs: Secondary | ICD-10-CM

## 2017-07-14 DIAGNOSIS — Z87891 Personal history of nicotine dependence: Secondary | ICD-10-CM

## 2017-07-14 NOTE — Telephone Encounter (Signed)
Received referral for initial lung cancer screening scan. Contacted patient and obtained smoking history,(current, 66 pack year) as well as answering questions related to screening process. Patient denies signs of lung cancer such as weight loss or hemoptysis. Patient denies comorbidity that would prevent curative treatment if lung cancer were found. Patient is scheduled for shared decision making visit and CT scan on 07/28/17.

## 2017-07-28 ENCOUNTER — Inpatient Hospital Stay: Payer: BLUE CROSS/BLUE SHIELD | Attending: Oncology | Admitting: Oncology

## 2017-07-28 ENCOUNTER — Ambulatory Visit
Admission: RE | Admit: 2017-07-28 | Discharge: 2017-07-28 | Disposition: A | Discharge status: Home / Self Care | Payer: BLUE CROSS/BLUE SHIELD | Source: Ambulatory Visit | Attending: Oncology | Admitting: Oncology

## 2017-07-28 DIAGNOSIS — Z87891 Personal history of nicotine dependence: Secondary | ICD-10-CM

## 2017-07-28 DIAGNOSIS — J439 Emphysema, unspecified: Secondary | ICD-10-CM | POA: Insufficient documentation

## 2017-07-28 DIAGNOSIS — F17210 Nicotine dependence, cigarettes, uncomplicated: Secondary | ICD-10-CM

## 2017-07-28 DIAGNOSIS — Z122 Encounter for screening for malignant neoplasm of respiratory organs: Secondary | ICD-10-CM | POA: Insufficient documentation

## 2017-07-28 NOTE — Progress Notes (Signed)
In accordance with CMS guidelines, patient has met eligibility criteria including age, absence of signs or symptoms of lung cancer.  Social History   Tobacco Use  . Smoking status: Current Every Day Smoker    Packs/day: 1.50    Years: 44.00    Pack years: 66.00    Types: Cigarettes  . Smokeless tobacco: Never Used  Substance Use Topics  . Alcohol use: Yes    Alcohol/week: 0.0 oz    Comment: MIXED DRINKS EVERY DAY  . Drug use: No     A shared decision-making session was conducted prior to the performance of CT scan. This includes one or more decision aids, includes benefits and harms of screening, follow-up diagnostic testing, over-diagnosis, false positive rate, and total radiation exposure.  Counseling on the importance of adherence to annual lung cancer LDCT screening, impact of co-morbidities, and ability or willingness to undergo diagnosis and treatment is imperative for compliance of the program.  Counseling on the importance of continued smoking cessation for former smokers; the importance of smoking cessation for current smokers, and information about tobacco cessation interventions have been given to patient including [place] and [facility] quit [place] programs.  Written order for lung cancer screening with LDCT has been given to the patient and any and all questions have been answered to the best of my abilities.   Yearly follow up will be coordinated by Burgess Estelle, Thoracic Navigator.  Faythe Casa, NP 07/28/2017 2:48 PM

## 2017-07-31 ENCOUNTER — Encounter: Payer: Self-pay | Admitting: *Deleted

## 2018-03-22 ENCOUNTER — Ambulatory Visit
Admission: RE | Admit: 2018-03-22 | Discharge: 2018-03-22 | Disposition: A | Discharge status: Home / Self Care | Payer: BLUE CROSS/BLUE SHIELD | Source: Ambulatory Visit | Attending: Family Medicine | Admitting: Family Medicine

## 2018-03-22 ENCOUNTER — Ambulatory Visit (INDEPENDENT_AMBULATORY_CARE_PROVIDER_SITE_OTHER): Payer: Self-pay | Admitting: Family Medicine

## 2018-03-22 ENCOUNTER — Other Ambulatory Visit: Payer: Self-pay

## 2018-03-22 ENCOUNTER — Encounter: Payer: Self-pay | Admitting: Family Medicine

## 2018-03-22 VITALS — BP 130/84 | HR 64 | Temp 98.0°F | Ht 72.0 in | Wt 202.6 lb

## 2018-03-22 DIAGNOSIS — R05 Cough: Secondary | ICD-10-CM | POA: Diagnosis not present

## 2018-03-22 DIAGNOSIS — J180 Bronchopneumonia, unspecified organism: Secondary | ICD-10-CM

## 2018-03-22 DIAGNOSIS — R0602 Shortness of breath: Secondary | ICD-10-CM | POA: Diagnosis not present

## 2018-03-22 MED ORDER — HYDROCODONE-HOMATROPINE 5-1.5 MG/5ML PO SYRP: 5.0000 mL | ORAL_SOLUTION | Freq: Four times a day (QID) | ORAL | 0 refills | Status: DC | PRN

## 2018-03-22 MED ORDER — LEVOFLOXACIN 500 MG PO TABS: 500.0000 mg | ORAL_TABLET | Freq: Every day | ORAL | 0 refills | Status: DC

## 2018-03-22 MED ORDER — ALBUTEROL SULFATE HFA 108 (90 BASE) MCG/ACT IN AERS: 2.0000 | INHALATION_SPRAY | Freq: Four times a day (QID) | RESPIRATORY_TRACT | 2 refills | Status: DC | PRN

## 2018-03-22 NOTE — Progress Notes (Signed)
Patient: Thomas Cross Male    DOB: 10-26-1955   62 y.o.   MRN: [id number] Visit Date: 03/22/2018  Today's Provider: [name], PA   Chief Complaint  Patient presents with  . Cough    cream to grayish color mucus  . Sinusitis   Subjective:    HPI pt reports he has a cold and congestion with cream to grayish color mucus.  Symptoms started Friday 03/19/18 and he has been taking alka-selter plus and nyquil but it is not working.  No fever but felt great deal of malaise. Some ears popping, sore throat and head pressure with white to gray yellow sputum.    Past Medical History:  Diagnosis Date  . Arthritis   . GERD (gastroesophageal reflux disease)    RARE-TUMS   Past Surgical History:  Procedure Laterality Date  . COLONOSCOPY  2009  . HERNIA REPAIR  30 years ago   right inguinal  . INGUINAL HERNIA REPAIR Right 05/20/2016   Procedure: LAPAROSCOPIC INGUINAL HERNIA;  Surgeon: Christene Lye, MD;  Location: [facility] ORS;  Service: General;  Laterality: Right;  . TONSILLECTOMY AND ADENOIDECTOMY     Family History  Problem Relation Age of Onset  . Heart disease Mother   . Kidney cancer Mother   . Bladder Cancer Mother   . CVA Father   . Prostate cancer Father   . Dementia Father   . Hypertension Father   . Heart attack Brother   . Hyperlipidemia Brother   . Heart disease Maternal Grandfather   . Stroke Paternal Grandmother   . Colon cancer Paternal Grandfather   . Hyperlipidemia Sister   . Hyperlipidemia Brother   . Hyperlipidemia Brother   . Hyperlipidemia Brother    No Known Allergies  Current Outpatient Medications:  .  aspirin EC 81 MG tablet, Take 81 mg by mouth every evening. , Disp: , Rfl:  .  calcium carbonate (TUMS - DOSED IN MG ELEMENTAL CALCIUM) 500 MG chewable tablet, Chew 1 tablet by mouth as needed for indigestion or heartburn., Disp: , Rfl:  .  Cholecalciferol 1000 units capsule, Take 1,000 Units by mouth every evening. , Disp: , Rfl:  .   Multiple Vitamins-Minerals (MULTIVITAMIN WITH MINERALS) tablet, Take 1 tablet by mouth every evening. , Disp: , Rfl:  .  Omega-3 Fatty Acids (FISH OIL) 1000 MG CPDR, Take 1 capsule by mouth every evening. , Disp: , Rfl:  .  Vitamins-Lipotropics (LIPOFLAVONOID) TABS, Take 1 tablet by mouth every evening. , Disp: , Rfl:   Review of Systems  Constitutional: Positive for fatigue. Negative for activity change, appetite change, chills, diaphoresis, fever and unexpected weight change.  HENT: Positive for congestion, sinus pressure and sinus pain. Negative for dental problem, drooling, ear discharge, ear pain, facial swelling, hearing loss, mouth sores, nosebleeds, postnasal drip, rhinorrhea, sneezing, sore throat, tinnitus, trouble swallowing and voice change.   Eyes: Negative.   Respiratory: Positive for cough. Negative for apnea, choking, chest tightness, shortness of breath, wheezing and stridor.   Cardiovascular: Negative.   Gastrointestinal: Negative.   Endocrine: Negative.   Genitourinary: Negative.   Musculoskeletal: Negative.   Skin: Negative.   Allergic/Immunologic: Negative.   Neurological: Negative.   Hematological: Negative.   Psychiatric/Behavioral: Negative.    Social History   Tobacco Use  . Smoking status: Current Every Day Smoker    Packs/day: 1.50    Years: 44.00    Pack years: 66.00    Types: Cigarettes  .  Smokeless tobacco: Never Used  Substance Use Topics  . Alcohol use: Yes    Alcohol/week: 0.0 standard drinks    Comment: MIXED DRINKS EVERY DAY   Objective:   BP 130/84 (BP Location: Right Arm, Patient Position: Sitting, Cuff Size: Normal)   Pulse 64   Temp 98 F (36.7 C)   Ht 6' (1.829 m)   Wt 202 lb 9.6 oz (91.9 kg)   SpO2 97%   BMI 27.48 kg/m  Vitals:   03/22/18 0812  BP: 130/84  Pulse: 64  Temp: 98 F (36.7 C)  SpO2: 97%  Weight: 202 lb 9.6 oz (91.9 kg)  Height: 6' (1.829 m)   Physical Exam  Constitutional: He is oriented to person, place, and  time. He appears well-developed and well-nourished. No distress.  HENT:  Head: Normocephalic and atraumatic.  Right Ear: Hearing and external ear normal.  Left Ear: Hearing and external ear normal.  Nose: Nose normal.  Cobblestoning of posterior pharynx. No exudates.  Eyes: Conjunctivae and lids are normal. Right eye exhibits no discharge. Left eye exhibits no discharge. No scleral icterus.  Neck: Neck supple.  Cardiovascular: Normal rate and regular rhythm.  Pulmonary/Chest: Effort normal. No respiratory distress. He has wheezes. He has rales.  Heavy rales/rhonchi in both lungs.  Abdominal: Soft. Bowel sounds are normal.  Musculoskeletal: Normal range of motion.  Neurological: He is alert and oriented to person, place, and time.  Skin: Skin is intact. No lesion and no rash noted.  Psychiatric: He has a normal mood and affect. His speech is normal and behavior is normal. Thought content normal.      Assessment & Plan:     1. Bronchopneumonia Developed cough that progressed to malaise, yellowish sputum production, wheeze, sore throat, stuffy head and stopped up ears. No fever. Heavy rales/rhonchi with some wheeze on auscultation of chest. Will treat with antibiotic, albuterol inhaler and cough syrup. Increase fluid intake and get CBC with CXR. Recheck pending lab reports - rule out pneumonia. - CBC with Differential/Platelet - DG Chest 2 View - levofloxacin (LEVAQUIN) 500 MG tablet; Take 1 tablet (500 mg total) by mouth daily.  Dispense: 7 tablet; Refill: 0 - albuterol (PROVENTIL HFA;VENTOLIN HFA) 108 (90 Base) MCG/ACT inhaler; Inhale 2 puffs into the lungs every 6 (six) hours as needed for wheezing or shortness of breath.  Dispense: 1 Inhaler; Refill: 2 - HYDROcodone-homatropine (HYCODAN) 5-1.5 MG/5ML syrup; Take 5 mLs by mouth every 6 (six) hours as needed for cough.  Dispense: 120 mL; Refill: 0       [name], PA  [facility]

## 2018-03-23 ENCOUNTER — Telehealth: Payer: Self-pay | Admitting: Family Medicine

## 2018-03-23 LAB — CBC WITH DIFFERENTIAL/PLATELET
BASOS: 0 %
Basophils Absolute: 0 10*3/uL (ref 0.0–0.2)
EOS (ABSOLUTE): 0.2 10*3/uL (ref 0.0–0.4)
Eos: 2 %
Hematocrit: 44.7 % (ref 37.5–51.0)
Hemoglobin: 15.4 g/dL (ref 13.0–17.7)
IMMATURE GRANULOCYTES: 0 %
Immature Grans (Abs): 0 10*3/uL (ref 0.0–0.1)
Lymphocytes Absolute: 1.1 10*3/uL (ref 0.7–3.1)
Lymphs: 12 %
MCH: 34 pg — AB (ref 26.6–33.0)
MCHC: 34.5 g/dL (ref 31.5–35.7)
MCV: 99 fL — AB (ref 79–97)
MONOS ABS: 0.8 10*3/uL (ref 0.1–0.9)
Monocytes: 9 %
NEUTROS ABS: 7.1 10*3/uL — AB (ref 1.4–7.0)
NEUTROS PCT: 77 %
PLATELETS: 236 10*3/uL (ref 150–450)
RBC: 4.53 x10E6/uL (ref 4.14–5.80)
RDW: 12.2 % — AB (ref 12.3–15.4)
WBC: 9.3 10*3/uL (ref 3.4–10.8)

## 2018-03-23 NOTE — Telephone Encounter (Signed)
Pt returned missed call.  Please call pt back. ° °Thanks, °[facility] °

## 2018-03-29 ENCOUNTER — Encounter: Payer: Self-pay | Admitting: Family Medicine

## 2018-03-29 ENCOUNTER — Other Ambulatory Visit: Payer: Self-pay

## 2018-03-29 ENCOUNTER — Ambulatory Visit: Payer: BLUE CROSS/BLUE SHIELD | Admitting: Family Medicine

## 2018-03-29 VITALS — BP 138/78 | HR 56 | Temp 98.0°F | Ht 71.0 in | Wt 199.8 lb

## 2018-03-29 DIAGNOSIS — J180 Bronchopneumonia, unspecified organism: Secondary | ICD-10-CM | POA: Diagnosis not present

## 2018-03-29 MED ORDER — LEVOFLOXACIN 500 MG PO TABS: 500.0000 mg | ORAL_TABLET | Freq: Every day | ORAL | 0 refills | Status: DC

## 2018-03-29 NOTE — Progress Notes (Signed)
Patient: Thomas Cross Male    DOB: 05-10-1956   62 y.o.   MRN: [id number] Visit Date: 03/29/2018  Today's Provider: [name], PA   Chief Complaint  Patient presents with  . Follow-up    bronchitis   Subjective:    HPI  Pt reports he is here for a follow up on bronchitis.  Pt states he has finished all his antibiotics and is feeling better. Cough better with less sputum production. Fever up and down at times. Energy level still low.    Past Medical History:  Diagnosis Date  . Arthritis   . GERD (gastroesophageal reflux disease)    RARE-TUMS   Past Surgical History:  Procedure Laterality Date  . COLONOSCOPY  2009  . HERNIA REPAIR  30 years ago   right inguinal  . INGUINAL HERNIA REPAIR Right 05/20/2016   Procedure: LAPAROSCOPIC INGUINAL HERNIA;  Surgeon: Christene Lye, MD;  Location: [facility] ORS;  Service: General;  Laterality: Right;  . TONSILLECTOMY AND ADENOIDECTOMY     Family History  Problem Relation Age of Onset  . Heart disease Mother   . Kidney cancer Mother   . Bladder Cancer Mother   . CVA Father   . Prostate cancer Father   . Dementia Father   . Hypertension Father   . Heart attack Brother   . Hyperlipidemia Brother   . Heart disease Maternal Grandfather   . Stroke Paternal Grandmother   . Colon cancer Paternal Grandfather   . Hyperlipidemia Sister   . Hyperlipidemia Brother   . Hyperlipidemia Brother   . Hyperlipidemia Brother    No Known Allergies  Current Outpatient Medications:  .  albuterol (PROVENTIL HFA;VENTOLIN HFA) 108 (90 Base) MCG/ACT inhaler, Inhale 2 puffs into the lungs every 6 (six) hours as needed for wheezing or shortness of breath., Disp: 1 Inhaler, Rfl: 2 .  aspirin EC 81 MG tablet, Take 81 mg by mouth every evening. , Disp: , Rfl:  .  calcium carbonate (TUMS - DOSED IN MG ELEMENTAL CALCIUM) 500 MG chewable tablet, Chew 1 tablet by mouth as needed for indigestion or heartburn., Disp: , Rfl:  .  Cholecalciferol  1000 units capsule, Take 1,000 Units by mouth every evening. , Disp: , Rfl:  .  HYDROcodone-homatropine (HYCODAN) 5-1.5 MG/5ML syrup, Take 5 mLs by mouth every 6 (six) hours as needed for cough., Disp: 120 mL, Rfl: 0 .  Multiple Vitamins-Minerals (MULTIVITAMIN WITH MINERALS) tablet, Take 1 tablet by mouth every evening. , Disp: , Rfl:  .  Omega-3 Fatty Acids (FISH OIL) 1000 MG CPDR, Take 1 capsule by mouth every evening. , Disp: , Rfl:  .  Vitamins-Lipotropics (LIPOFLAVONOID) TABS, Take 1 tablet by mouth every evening. , Disp: , Rfl:  .  levofloxacin (LEVAQUIN) 500 MG tablet, Take 1 tablet (500 mg total) by mouth daily. (Patient not taking: Reported on 03/29/2018), Disp: 7 tablet, Rfl: 0  Review of Systems  Constitutional: Negative.   HENT: Negative.   Eyes: Negative.   Respiratory: Negative.   Cardiovascular: Negative.   Gastrointestinal: Negative.   Endocrine: Negative.   Genitourinary: Negative.   Musculoskeletal: Negative.   Skin: Negative.   Allergic/Immunologic: Negative.   Neurological: Negative.   Hematological: Negative.   Psychiatric/Behavioral: Negative.    Social History   Tobacco Use  . Smoking status: Current Every Day Smoker    Packs/day: 1.50    Years: 44.00    Pack years: 66.00    Types: Cigarettes  .  Smokeless tobacco: Never Used  Substance Use Topics  . Alcohol use: Yes    Alcohol/week: 0.0 standard drinks    Comment: MIXED DRINKS EVERY DAY   Objective:   BP 138/78 (BP Location: Right Arm, Patient Position: Sitting, Cuff Size: Normal)   Pulse (!) 56   Temp 98 F (36.7 C) (Oral)   Ht 5\' 11"  (1.803 m)   Wt 199 lb 12.8 oz (90.6 kg)   SpO2 97%   BMI 27.87 kg/m  Vitals:   03/29/18 0813  BP: 138/78  Pulse: (!) 56  Temp: 98 F (36.7 C)  TempSrc: Oral  SpO2: 97%  Weight: 199 lb 12.8 oz (90.6 kg)  Height: 5\' 11"  (1.803 m)   Physical Exam  Constitutional: He is oriented to person, place, and time. He appears well-developed and well-nourished. No  distress.  HENT:  Head: Normocephalic and atraumatic.  Right Ear: Hearing normal.  Left Ear: Hearing normal.  Nose: Nose normal.  Eyes: Conjunctivae and lids are normal. Right eye exhibits no discharge. Left eye exhibits no discharge. No scleral icterus.  Cardiovascular: Normal rate and regular rhythm.  Pulmonary/Chest: Effort normal. No respiratory distress.  Few rhonchi in the right anterior chest.  Musculoskeletal: Normal range of motion.  Neurological: He is alert and oriented to person, place, and time.  Skin: Skin is intact. No lesion and no rash noted.  Psychiatric: He has a normal mood and affect. His speech is normal and behavior is normal. Thought content normal.      Assessment & Plan:     1. Bronchopneumonia Rhonchi in the right chest without fever today. Still has some Hycodan cough syrup left and finished the first 7 days of the Levaquin. Feels congestion and malaise with lack of energy returning today. Will refill Levaquin and recheck if any more worsening. CBC on 03-22-18 showed bacterial shift with no consolidated infiltrates on the CXR. Continue to drink extra fluids and get rest. Recheck in a week as needed. - levofloxacin (LEVAQUIN) 500 MG tablet; Take 1 tablet (500 mg total) by mouth daily.  Dispense: 7 tablet; Refill: [place], [place]  [facility]

## 2018-06-22 DIAGNOSIS — H47323 Drusen of optic disc, bilateral: Secondary | ICD-10-CM | POA: Diagnosis not present

## 2018-06-29 ENCOUNTER — Encounter: Payer: Self-pay | Admitting: Unknown Physician Specialty

## 2018-06-29 ENCOUNTER — Ambulatory Visit: Payer: Self-pay | Admitting: Unknown Physician Specialty

## 2018-06-29 VITALS — BP 132/75 | HR 64 | Ht 70.7 in | Wt 199.2 lb

## 2018-06-29 DIAGNOSIS — Z024 Encounter for examination for driving license: Secondary | ICD-10-CM

## 2018-07-06 NOTE — Progress Notes (Signed)
   BP 132/75 (BP Location: Left Arm, Cuff Size: Normal)   Pulse 64   Ht 5' 10.7" (1.796 m)   Wt 199 lb 3.2 oz (90.4 kg)   SpO2 99%   BMI 28.02 kg/m    Subjective:    Patient ID: Thomas Cross, male    DOB: [birth year], 63 y.o.   MRN: [id number]  HPI: Thomas Cross is a 63 y.o. male  Chief Complaint  Patient presents with  . DOT Physical   See form  Relevant past medical, surgical, family and social history reviewed and updated as indicated. Interim medical history since our last visit reviewed. Allergies and medications reviewed and updated.  Review of Systems  Per HPI unless specifically indicated above     Objective:    BP 132/75 (BP Location: Left Arm, Cuff Size: Normal)   Pulse 64   Ht 5' 10.7" (1.796 m)   Wt 199 lb 3.2 oz (90.4 kg)   SpO2 99%   BMI 28.02 kg/m   Wt Readings from Last 3 Encounters:  06/29/18 199 lb 3.2 oz (90.4 kg)  03/29/18 199 lb 12.8 oz (90.6 kg)  03/22/18 202 lb 9.6 oz (91.9 kg)    Physical Exam  See form Results for orders placed or performed in visit on 03/22/18  CBC with Differential/Platelet  Result Value Ref Range   WBC 9.3 3.4 - 10.8 x10E3/uL   RBC 4.53 4.14 - 5.80 x10E6/uL   Hemoglobin 15.4 13.0 - 17.7 g/dL   Hematocrit 44.7 37.5 - 51.0 %   MCV 99 (H) 79 - 97 fL   MCH 34.0 (H) 26.6 - 33.0 pg   MCHC 34.5 31.5 - 35.7 g/dL   RDW 12.2 (L) 12.3 - 15.4 %   Platelets 236 150 - 450 x10E3/uL   Neutrophils 77 Not Estab. %   Lymphs 12 Not Estab. %   Monocytes 9 Not Estab. %   Eos 2 Not Estab. %   Basos 0 Not Estab. %   Neutrophils Absolute 7.1 (H) 1.4 - 7.0 x10E3/uL   Lymphocytes Absolute 1.1 0.7 - 3.1 x10E3/uL   Monocytes Absolute 0.8 0.1 - 0.9 x10E3/uL   EOS (ABSOLUTE) 0.2 0.0 - 0.4 x10E3/uL   Basophils Absolute 0.0 0.0 - 0.2 x10E3/uL   Immature Granulocytes 0 Not Estab. %   Immature Grans (Abs) 0.0 0.0 - 0.1 x10E3/uL      Assessment & Plan:   Problem List Items Addressed This Visit    None    Visit Diagnoses    Encounter for CDL (commercial driving license) exam    -  Primary        Follow up plan: No follow-ups on file.

## 2018-07-17 ENCOUNTER — Telehealth: Payer: Self-pay

## 2018-07-17 NOTE — Telephone Encounter (Signed)
Call pt regarding lung screening. Pt is a current smoker, smoking about 1 pack per day. Pt would like scan to be in the morning. Pt denies any new health issus except bronchitis.

## 2018-07-19 ENCOUNTER — Encounter: Payer: Self-pay | Admitting: *Deleted

## 2018-07-19 ENCOUNTER — Telehealth: Payer: Self-pay | Admitting: *Deleted

## 2018-07-19 DIAGNOSIS — Z122 Encounter for screening for malignant neoplasm of respiratory organs: Secondary | ICD-10-CM

## 2018-07-19 NOTE — Telephone Encounter (Signed)
Patient has been notified that the annual lung cancer screening low dose CT scan is due currently or will be in the near future.  Confirmed that the patient is within the age range of 80-80, and asymptomatic, and currently exhibits no signs or symptoms of lung cancer.  Patient denies illness that would prevent curative treatment for lung cancer if found.  Verified smoking history, current smoker with 67.5pkyr hx .  The shared decision making visit was completed on 07-28-17.  Patient is agreeable for the CT scan to be scheduled.  Will call patient back with date and time of appointment.

## 2018-07-20 ENCOUNTER — Telehealth: Payer: Self-pay | Admitting: *Deleted

## 2018-07-20 NOTE — Telephone Encounter (Signed)
CALLED PT TO INFORM HIM OF HIS APPT FOR LDCT SCREENING ON Friday 07/30/2018 here @ [facility] @ 9:45am, VOICED UNDERSTANDING.

## 2018-07-30 ENCOUNTER — Ambulatory Visit
Admission: RE | Admit: 2018-07-30 | Discharge: 2018-07-30 | Disposition: A | Discharge status: Home / Self Care | Payer: BLUE CROSS/BLUE SHIELD | Source: Ambulatory Visit | Attending: Nurse Practitioner | Admitting: Nurse Practitioner

## 2018-07-30 ENCOUNTER — Other Ambulatory Visit: Payer: Self-pay

## 2018-07-30 DIAGNOSIS — Z122 Encounter for screening for malignant neoplasm of respiratory organs: Secondary | ICD-10-CM | POA: Insufficient documentation

## 2018-07-30 DIAGNOSIS — F17210 Nicotine dependence, cigarettes, uncomplicated: Secondary | ICD-10-CM | POA: Diagnosis not present

## 2018-08-04 ENCOUNTER — Encounter: Payer: Self-pay | Admitting: *Deleted

## 2018-12-30 DIAGNOSIS — H47323 Drusen of optic disc, bilateral: Secondary | ICD-10-CM | POA: Diagnosis not present

## 2019-06-23 ENCOUNTER — Encounter: Payer: Self-pay | Admitting: Family Medicine

## 2019-06-23 ENCOUNTER — Ambulatory Visit (INDEPENDENT_AMBULATORY_CARE_PROVIDER_SITE_OTHER): Payer: Medicare HMO | Admitting: Family Medicine

## 2019-06-23 ENCOUNTER — Other Ambulatory Visit: Payer: Self-pay

## 2019-06-23 VITALS — BP 120/80 | HR 50 | Temp 97.3°F | Ht 72.0 in | Wt 200.0 lb

## 2019-06-23 DIAGNOSIS — Z8042 Family history of malignant neoplasm of prostate: Secondary | ICD-10-CM

## 2019-06-23 DIAGNOSIS — Z23 Encounter for immunization: Secondary | ICD-10-CM

## 2019-06-23 DIAGNOSIS — J449 Chronic obstructive pulmonary disease, unspecified: Secondary | ICD-10-CM | POA: Diagnosis not present

## 2019-06-23 DIAGNOSIS — Z1211 Encounter for screening for malignant neoplasm of colon: Secondary | ICD-10-CM

## 2019-06-23 DIAGNOSIS — Z0000 Encounter for general adult medical examination without abnormal findings: Secondary | ICD-10-CM | POA: Diagnosis not present

## 2019-06-23 DIAGNOSIS — N400 Enlarged prostate without lower urinary tract symptoms: Secondary | ICD-10-CM | POA: Diagnosis not present

## 2019-06-23 DIAGNOSIS — E7800 Pure hypercholesterolemia, unspecified: Secondary | ICD-10-CM | POA: Diagnosis not present

## 2019-06-23 DIAGNOSIS — E559 Vitamin D deficiency, unspecified: Secondary | ICD-10-CM | POA: Diagnosis not present

## 2019-06-23 DIAGNOSIS — H53482 Generalized contraction of visual field, left eye: Secondary | ICD-10-CM | POA: Diagnosis not present

## 2019-06-23 NOTE — Progress Notes (Signed)
Patient: Thomas Cross, Male    DOB: March 04, 1956, 64 y.o.   MRN: [id number] Visit Date: 06/23/2019  Today's Provider: [name], PA   Chief Complaint  Patient presents with  . Annual Exam   Subjective:     Complete Physical Thomas Cross is a 64 y.o. male. He feels well. He reports exercising daily. He reports he is sleeping well.  -----------------------------------------------------------   Review of Systems  Constitutional: Negative.   HENT: Positive for tinnitus.   Eyes: Negative.   Respiratory: Negative.   Cardiovascular: Negative.   Gastrointestinal: Negative.   Endocrine: Negative.   Genitourinary: Negative.   Musculoskeletal: Negative.   Skin: Negative.   Allergic/Immunologic: Negative.   Neurological: Negative.   Hematological: Negative.   Psychiatric/Behavioral: Negative.     Social History   Socioeconomic History  . Marital status: Married    Spouse name: Not on file  . Number of children: Not on file  . Years of education: Not on file  . Highest education level: Not on file  Occupational History  . Not on file  Tobacco Use  . Smoking status: Current Every Day Smoker    Packs/day: 1.50    Years: 45.00    Pack years: 67.50    Types: Cigarettes  . Smokeless tobacco: Never Used  Substance and Sexual Activity  . Alcohol use: Yes    Alcohol/week: 0.0 standard drinks    Comment: MIXED DRINKS EVERY DAY  . Drug use: No  . Sexual activity: Not on file  Other Topics Concern  . Not on file  Social History Narrative  . Not on file   Social Determinants of Health   Financial Resource Strain:   . Difficulty of Paying Living Expenses: Not on file  Food Insecurity:   . Worried About Charity fundraiser in the Last Year: Not on file  . Ran Out of Food in the Last Year: Not on file  Transportation Needs:   . Lack of Transportation (Medical): Not on file  . Lack of Transportation (Non-Medical): Not on file  Physical Activity:   . Days  of Exercise per Week: Not on file  . Minutes of Exercise per Session: Not on file  Stress:   . Feeling of Stress : Not on file  Social Connections:   . Frequency of Communication with Friends and Family: Not on file  . Frequency of Social Gatherings with Friends and Family: Not on file  . Attends Religious Services: Not on file  . Active Member of Clubs or Organizations: Not on file  . Attends Archivist Meetings: Not on file  . Marital Status: Not on file  Intimate Partner Violence:   . Fear of Current or Ex-Partner: Not on file  . Emotionally Abused: Not on file  . Physically Abused: Not on file  . Sexually Abused: Not on file    Past Medical History:  Diagnosis Date  . Arthritis   . GERD (gastroesophageal reflux disease)    RARE-TUMS     Patient Active Problem List   Diagnosis Date Noted  . COPD (chronic obstructive pulmonary disease) ([place]) 07/08/2017  . Family history of malignant neoplasm of prostate 06/11/2015  . Smoking greater than 30 pack years 06/11/2015  . Avitaminosis D 06/11/2015  . Hypercholesterolemia without hypertriglyceridemia 12/04/2005    Past Surgical History:  Procedure Laterality Date  . COLONOSCOPY  2009  . HERNIA REPAIR  30 years ago   right inguinal  .  INGUINAL HERNIA REPAIR Right 05/20/2016   Procedure: LAPAROSCOPIC INGUINAL HERNIA;  Surgeon: Christene Lye, MD;  Location: [facility] ORS;  Service: General;  Laterality: Right;  . TONSILLECTOMY AND ADENOIDECTOMY      His family history includes Bladder Cancer in his mother; CVA in his father; Colon cancer in his paternal grandfather; Dementia in his father; Heart attack in his brother; Heart disease in his maternal grandfather and mother; Hyperlipidemia in his brother, brother, brother, brother, and sister; Hypertension in his father; Kidney cancer in his mother; Prostate cancer in his father; Stroke in his paternal grandmother.   No Known Allergies  Current Outpatient Medications:    .  aspirin EC 81 MG tablet, Take 81 mg by mouth every evening. , Disp: , Rfl:  .  calcium carbonate (TUMS - DOSED IN MG ELEMENTAL CALCIUM) 500 MG chewable tablet, Chew 1 tablet by mouth as needed for indigestion or heartburn., Disp: , Rfl:  .  Cholecalciferol 1000 units capsule, Take 1,000 Units by mouth every evening. , Disp: , Rfl:  .  Multiple Vitamins-Minerals (MULTIVITAMIN WITH MINERALS) tablet, Take 1 tablet by mouth every evening. , Disp: , Rfl:  .  Omega-3 Fatty Acids (FISH OIL) 1000 MG CPDR, Take 1 capsule by mouth every evening. , Disp: , Rfl:  .  albuterol (PROVENTIL HFA;VENTOLIN HFA) 108 (90 Base) MCG/ACT inhaler, Inhale 2 puffs into the lungs every 6 (six) hours as needed for wheezing or shortness of breath. (Patient not taking: Reported on 06/23/2019), Disp: 1 Inhaler, Rfl: 2 .  HYDROcodone-homatropine (HYCODAN) 5-1.5 MG/5ML syrup, Take 5 mLs by mouth every 6 (six) hours as needed for cough. (Patient not taking: Reported on 06/23/2019), Disp: 120 mL, Rfl: 0 .  levofloxacin (LEVAQUIN) 500 MG tablet, Take 1 tablet (500 mg total) by mouth daily. (Patient not taking: Reported on 06/23/2019), Disp: 7 tablet, Rfl: 0 .  Vitamins-Lipotropics (LIPOFLAVONOID) TABS, Take 1 tablet by mouth every evening. , Disp: , Rfl:   Patient Care Team: Chrismon, Vickki Muff, PA as PCP - General (Family Medicine) Chrismon, Vickki Muff, PA (Family Medicine) Christene Lye, MD (General Surgery)     Objective:    Vitals: BP 120/80 (BP Location: Right Arm, Patient Position: Sitting, Cuff Size: Normal)   Pulse (!) 50   Temp (!) 97.3 F (36.3 C) (Skin)   Ht 6' (1.829 m)   Wt 200 lb (90.7 kg)   SpO2 99%   BMI 27.12 kg/m  Wt Readings from Last 3 Encounters:  06/23/19 200 lb (90.7 kg)  07/30/18 199 lb (90.3 kg)  06/29/18 199 lb 3.2 oz (90.4 kg)    Physical Exam Constitutional:      Appearance: He is well-developed.  HENT:     Head: Normocephalic and atraumatic.     Right Ear: Tympanic membrane and  external ear normal.     Left Ear: Tympanic membrane and external ear normal.     Nose: Nose normal.     Mouth/Throat:     Pharynx: Oropharynx is clear.  Eyes:     General:        Right eye: No discharge.     Conjunctiva/sclera: Conjunctivae normal.     Pupils: Pupils are equal, round, and reactive to light.     Comments: Decrease in peripheral vision in the left eye followed by ophthalmologist.  Neck:     Thyroid: No thyromegaly.     Trachea: No tracheal deviation.  Cardiovascular:     Rate and Rhythm: Normal rate and  regular rhythm.     Pulses: Normal pulses.     Heart sounds: Normal heart sounds. No murmur.  Pulmonary:     Effort: Pulmonary effort is normal. No respiratory distress.     Breath sounds: Normal breath sounds. No wheezing or rales.  Chest:     Chest wall: No tenderness.  Abdominal:     General: Bowel sounds are normal. There is no distension.     Palpations: Abdomen is soft. There is no mass.     Tenderness: There is no abdominal tenderness. There is no guarding or rebound.  Genitourinary:    Penis: Normal.      Testes: Normal.     Rectum: Normal. Guaiac result negative.     Comments: 2+ enlarged prostate on DRE. Musculoskeletal:        General: No tenderness. Normal range of motion.     Cervical back: Normal range of motion and neck supple.  Lymphadenopathy:     Cervical: No cervical adenopathy.  Skin:    General: Skin is warm and dry.     Findings: No erythema or rash.  Neurological:     Mental Status: He is alert and oriented to person, place, and time.     Cranial Nerves: No cranial nerve deficit.     Motor: No abnormal muscle tone.     Coordination: Coordination normal.     Deep Tendon Reflexes: Reflexes are normal and symmetric. Reflexes normal.  Psychiatric:        Behavior: Behavior normal.        Thought Content: Thought content normal.        Judgment: Judgment normal.     Activities of Daily Living In your present state of health, do  you have any difficulty performing the following activities: 06/23/2019  Hearing? Y  Vision? Y  Difficulty concentrating or making decisions? N  Walking or climbing stairs? N  Dressing or bathing? N  Doing errands, shopping? N  Some recent data might be hidden    Fall Risk Assessment Fall Risk  06/23/2019 03/22/2018 03/11/2016  Falls in the past year? 0 0 No     Depression Screen PHQ 2/9 Scores 06/23/2019 03/22/2018 06/30/2016 03/11/2016  PHQ - 2 Score 0 0 0 0    6CIT Screen 03/11/2016  What Year? 0 points  What month? 0 points  What time? 0 points  Count back from 20 0 points  Months in reverse 2 points  Repeat phrase 2 points  Total Score 4     Office Visit from 06/23/2019 in [place]  AUDIT-C Score  4     Functional Status Survey: Is the patient deaf or have difficulty hearing?: Yes Does the patient have difficulty seeing, even when wearing glasses/contacts?: Yes Does the patient have difficulty concentrating, remembering, or making decisions?: No Does the patient have difficulty walking or climbing stairs?: No Does the patient have difficulty dressing or bathing?: No Does the patient have difficulty doing errands alone such as visiting a doctor's office or shopping?: No Current Exercise Habits: The patient has a physically strenuous job, but has no regular exercise apart from work.   Home Exercise  06/23/2019  Current Exercise Habits The patient has a physically strenuous job, but has no regular exercise apart from work.      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     Immunization History  Administered Date(s) Administered  . Tdap 03/18/2011    Health Maintenance  Topic Date Due  . COLONOSCOPY  11/01/2017  . PNA vac Low Risk Adult (1 of 2 - PCV13) 10/04/2018  . INFLUENZA VACCINE  12/18/2018  . TETANUS/TDAP  03/17/2021  . Hepatitis C Screening  Completed  . HIV Screening  Completed     Discussed health benefits of physical activity, and encouraged him to engage in regular exercise appropriate for his age and condition.    ------------------------------------------------------------------------------------------------------------ 1. Annual physical exam General health stable. Slight ache in the right middle finger and right knee occasionally - "arthritis". May use Voltaren Gel or Aspercreme with Lidocaine. Due for follow up colonoscopy. No GI bleed or BM changes. Has a decrease in the peripheral vision in the left eye Refuses flu and pneumonia vaccinations. Given anticipatory counseling. Will check routine labs. - CBC with Differential/Platelet - Comprehensive metabolic panel - Lipid Panel With LDL/HDL Ratio - TSH  2. Chronic obstructive pulmonary disease, unspecified COPD type ([place]) No significant dyspnea. Still has some clear sputum occasionally. No wheezing and doesn't like using the Proventil inhaler. Still smoking 1.5-2 ppd. Encouraged to stop all smoking. - CBC with Differential/Platelet  3. Hypercholesterolemia without hypertriglyceridemia Trying to follow low fat diet and continues heavy physical work in his business. Continues to use Omega-# 1000 mg qd. Recheck labs and follow up pending reports. - Comprehensive metabolic panel - Lipid Panel With LDL/HDL Ratio - TSH  4. Avitaminosis D Continues to take Vitamin D 1000 unit daily. Check blood levels. - CBC with Differential/Platelet - VITAMIN D 25 Hydroxy (Vit-D Deficiency, Fractures)  5. Family history of malignant neoplasm of prostate Father had  prostate cancer. Patient has nocturia only once a night and prostate enlarged on DRE today. Check PSA. - PSA  6. Enlarged prostate on rectal examination 2+ enlarged prostate on DRE. Will check PSA. - PSA  7. Screening for colon cancer - Ambulatory referral to Gastroenterology  8. Need for vaccination for strep pneumoniae + influenza Declines both immunizations.  9. Tunnel visual field constriction of the left eye Followed by ophthalmologist. 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  [name], PA  [facility]

## 2019-06-24 LAB — LIPID PANEL WITH LDL/HDL RATIO
Cholesterol, Total: 188 mg/dL (ref 100–199)
HDL: 37 mg/dL — ABNORMAL LOW (ref >39)
LDL Chol Calc (NIH): 131 mg/dL — ABNORMAL HIGH (ref 0–99)
LDL/HDL Ratio: 3.5 ratio (ref 0.0–3.6)
Triglycerides: 112 mg/dL (ref 0–149)
VLDL Cholesterol Cal: 20 mg/dL (ref 5–40)

## 2019-06-24 LAB — PSA: Prostate Specific Ag, Serum: 0.3 ng/mL (ref 0.0–4.0)

## 2019-06-24 LAB — VITAMIN D 25 HYDROXY (VIT D DEFICIENCY, FRACTURES): Vit D, 25-Hydroxy: 24.5 ng/mL — ABNORMAL LOW (ref 30.0–100.0)

## 2019-06-24 LAB — CBC WITH DIFFERENTIAL/PLATELET
Basophils Absolute: 0 10*3/uL (ref 0.0–0.2)
Basos: 1 %
EOS (ABSOLUTE): 0.2 10*3/uL (ref 0.0–0.4)
Eos: 3 %
Hematocrit: 45.7 % (ref 37.5–51.0)
Hemoglobin: 15.8 g/dL (ref 13.0–17.7)
Immature Grans (Abs): 0 10*3/uL (ref 0.0–0.1)
Immature Granulocytes: 0 %
Lymphocytes Absolute: 1.4 10*3/uL (ref 0.7–3.1)
Lymphs: 19 %
MCH: 34.3 pg — ABNORMAL HIGH (ref 26.6–33.0)
MCHC: 34.6 g/dL (ref 31.5–35.7)
MCV: 99 fL — ABNORMAL HIGH (ref 79–97)
Monocytes Absolute: 0.5 10*3/uL (ref 0.1–0.9)
Monocytes: 8 %
Neutrophils Absolute: 5.1 10*3/uL (ref 1.4–7.0)
Neutrophils: 69 %
Platelets: 240 10*3/uL (ref 150–450)
RBC: 4.6 x10E6/uL (ref 4.14–5.80)
RDW: 12 % (ref 11.6–15.4)
WBC: 7.2 10*3/uL (ref 3.4–10.8)

## 2019-06-24 LAB — COMPREHENSIVE METABOLIC PANEL
ALT: 14 IU/L (ref 0–44)
AST: 17 IU/L (ref 0–40)
Albumin/Globulin Ratio: 1.8 (ref 1.2–2.2)
Albumin: 4.2 g/dL (ref 3.8–4.8)
Alkaline Phosphatase: 143 IU/L — ABNORMAL HIGH (ref 39–117)
BUN/Creatinine Ratio: 11 (ref 10–24)
BUN: 11 mg/dL (ref 8–27)
Bilirubin Total: 0.4 mg/dL (ref 0.0–1.2)
CO2: 22 mmol/L (ref 20–29)
Calcium: 9.1 mg/dL (ref 8.6–10.2)
Chloride: 105 mmol/L (ref 96–106)
Creatinine, Ser: 1 mg/dL (ref 0.76–1.27)
GFR calc Af Amer: 91 mL/min/{1.73_m2} (ref >59)
GFR calc non Af Amer: 79 mL/min/{1.73_m2} (ref >59)
Globulin, Total: 2.4 g/dL (ref 1.5–4.5)
Glucose: 84 mg/dL (ref 65–99)
Potassium: 4.5 mmol/L (ref 3.5–5.2)
Sodium: 142 mmol/L (ref 134–144)
Total Protein: 6.6 g/dL (ref 6.0–8.5)

## 2019-06-24 LAB — TSH: TSH: 2.21 u[IU]/mL (ref 0.450–4.500)

## 2019-07-04 DIAGNOSIS — H47323 Drusen of optic disc, bilateral: Secondary | ICD-10-CM | POA: Diagnosis not present

## 2019-07-13 ENCOUNTER — Encounter: Payer: Self-pay | Admitting: *Deleted

## 2019-07-29 ENCOUNTER — Telehealth: Payer: Self-pay | Admitting: *Deleted

## 2019-07-29 NOTE — Telephone Encounter (Signed)
(  07/29/19) Pt has been notified that lung cancer screening CT scan is due currently or will be in near future. Confirmed pt is within appropriate age range, and asymptomatic. Pt denies illness that would prevent curative treatment for lung cancer if found. Verified smoking history (Current Smoker,1.5 ppd ). Pt is agreeable for CT scan being scheduled and prefers an AM appt  SRW

## 2019-08-03 ENCOUNTER — Telehealth: Payer: Self-pay | Admitting: *Deleted

## 2019-08-03 DIAGNOSIS — Z87891 Personal history of nicotine dependence: Secondary | ICD-10-CM

## 2019-08-03 NOTE — Telephone Encounter (Signed)
Smoking history: current, 69 pack year. Scheduled for lung screening scan

## 2019-08-09 ENCOUNTER — Other Ambulatory Visit: Payer: Self-pay

## 2019-08-09 ENCOUNTER — Ambulatory Visit
Admission: RE | Admit: 2019-08-09 | Discharge: 2019-08-09 | Disposition: A | Discharge status: Home / Self Care | Payer: Medicare HMO | Source: Ambulatory Visit | Attending: Oncology | Admitting: Oncology

## 2019-08-09 DIAGNOSIS — Z87891 Personal history of nicotine dependence: Secondary | ICD-10-CM | POA: Insufficient documentation

## 2019-08-11 ENCOUNTER — Encounter: Payer: Self-pay | Admitting: *Deleted

## 2019-08-22 DIAGNOSIS — L570 Actinic keratosis: Secondary | ICD-10-CM | POA: Diagnosis not present

## 2019-08-22 DIAGNOSIS — D2262 Melanocytic nevi of left upper limb, including shoulder: Secondary | ICD-10-CM | POA: Diagnosis not present

## 2019-08-22 DIAGNOSIS — D225 Melanocytic nevi of trunk: Secondary | ICD-10-CM | POA: Diagnosis not present

## 2019-08-22 DIAGNOSIS — L821 Other seborrheic keratosis: Secondary | ICD-10-CM | POA: Diagnosis not present

## 2019-08-22 DIAGNOSIS — D2272 Melanocytic nevi of left lower limb, including hip: Secondary | ICD-10-CM | POA: Diagnosis not present

## 2019-08-22 DIAGNOSIS — D2271 Melanocytic nevi of right lower limb, including hip: Secondary | ICD-10-CM | POA: Diagnosis not present

## 2019-08-22 DIAGNOSIS — X32XXXA Exposure to sunlight, initial encounter: Secondary | ICD-10-CM | POA: Diagnosis not present

## 2019-08-22 DIAGNOSIS — D2261 Melanocytic nevi of right upper limb, including shoulder: Secondary | ICD-10-CM | POA: Diagnosis not present

## 2019-09-26 ENCOUNTER — Ambulatory Visit (INDEPENDENT_AMBULATORY_CARE_PROVIDER_SITE_OTHER): Payer: Medicare HMO | Admitting: Family Medicine

## 2019-09-26 ENCOUNTER — Other Ambulatory Visit: Payer: Self-pay

## 2019-09-26 ENCOUNTER — Ambulatory Visit: Payer: Self-pay

## 2019-09-26 ENCOUNTER — Encounter: Payer: Self-pay | Admitting: Family Medicine

## 2019-09-26 VITALS — BP 130/70 | HR 65 | Temp 97.3°F | Wt 199.0 lb

## 2019-09-26 DIAGNOSIS — R002 Palpitations: Secondary | ICD-10-CM | POA: Diagnosis not present

## 2019-09-26 DIAGNOSIS — R0683 Snoring: Secondary | ICD-10-CM

## 2019-09-26 DIAGNOSIS — R0781 Pleurodynia: Secondary | ICD-10-CM | POA: Diagnosis not present

## 2019-09-26 DIAGNOSIS — R5383 Other fatigue: Secondary | ICD-10-CM | POA: Diagnosis not present

## 2019-09-26 NOTE — Progress Notes (Signed)
Acute Office Visit  Subjective:    Patient ID: Thomas Cross, male    DOB: January 03, 1956, 64 y.o.   MRN: [id number]  Chief Complaint  Patient presents with  . Abdominal Pain    HPI Patient is in today for left sided abdominal pain.  He denies any nausea, vomiting, diarrhea, constipation, fever, difficulty urinating, and hematuria. He states that he has had a dull pain that will last about 10 seconds at a time in the area of his lowest left rib .  He denies radiation of the pain anywhere. He reports it as being concentrated in that one spot.  He does not get short of breath with it or elevated heart rate.  However, he does admit that at night he will at times wake with his heart racing and feel short of breath.  He described waking scared.  Per his wife he does snore and has had witnessed apnea.    Patient scored 15 on the Epworth Sleepiness Scale  Past Medical History:  Diagnosis Date  . Arthritis   . GERD (gastroesophageal reflux disease)    RARE-TUMS    Past Surgical History:  Procedure Laterality Date  . COLONOSCOPY  2009  . HERNIA REPAIR  30 years ago   right inguinal  . INGUINAL HERNIA REPAIR Right 05/20/2016   Procedure: LAPAROSCOPIC INGUINAL HERNIA;  Surgeon: Christene Lye, MD;  Location: [facility] ORS;  Service: General;  Laterality: Right;  . TONSILLECTOMY AND ADENOIDECTOMY      Family History  Problem Relation Age of Onset  . Heart disease Mother   . Kidney cancer Mother   . Bladder Cancer Mother   . CVA Father   . Prostate cancer Father   . Dementia Father   . Hypertension Father   . Heart attack Brother   . Hyperlipidemia Brother   . Heart disease Maternal Grandfather   . Stroke Paternal Grandmother   . Colon cancer Paternal Grandfather   . Hyperlipidemia Sister   . Hyperlipidemia Brother   . Hyperlipidemia Brother   . Hyperlipidemia Brother     Social History   Socioeconomic History  . Marital status: Married    Spouse name: Not on file  .  Number of children: Not on file  . Years of education: Not on file  . Highest education level: Not on file  Occupational History  . Not on file  Tobacco Use  . Smoking status: Current Every Day Smoker    Packs/day: 1.50    Years: 45.00    Pack years: 67.50    Types: Cigarettes  . Smokeless tobacco: Never Used  Substance and Sexual Activity  . Alcohol use: Yes    Alcohol/week: 0.0 standard drinks    Comment: MIXED DRINKS EVERY DAY  . Drug use: No  . Sexual activity: Not on file  Other Topics Concern  . Not on file  Social History Narrative  . Not on file   Social Determinants of Health   Financial Resource Strain:   . Difficulty of Paying Living Expenses:   Food Insecurity:   . Worried About Charity fundraiser in the Last Year:   . Arboriculturist in the Last Year:   Transportation Needs:   . Film/video editor (Medical):   Marland Kitchen Lack of Transportation (Non-Medical):   Physical Activity:   . Days of Exercise per Week:   . Minutes of Exercise per Session:   Stress:   . Feeling of Stress :  Social Connections:   . Frequency of Communication with Friends and Family:   . Frequency of Social Gatherings with Friends and Family:   . Attends Religious Services:   . Active Member of Clubs or Organizations:   . Attends Archivist Meetings:   Marland Kitchen Marital Status:   Intimate Partner Violence:   . Fear of Current or Ex-Partner:   . Emotionally Abused:   Marland Kitchen Physically Abused:   . Sexually Abused:     Outpatient Medications Prior to Visit  Medication Sig Dispense Refill  . Cholecalciferol 1000 units capsule Take 1,000 Units by mouth every evening.     . Multiple Vitamins-Minerals (MULTIVITAMIN WITH MINERALS) tablet Take 1 tablet by mouth every evening.     . Omega-3 Fatty Acids (FISH OIL) 1000 MG CPDR Take 1 capsule by mouth every evening.     Marland Kitchen aspirin EC 81 MG tablet Take 81 mg by mouth every evening.     . calcium carbonate (TUMS - DOSED IN MG ELEMENTAL CALCIUM)  500 MG chewable tablet Chew 1 tablet by mouth as needed for indigestion or heartburn.    . Vitamins-Lipotropics (LIPOFLAVONOID) TABS Take 1 tablet by mouth every evening.     Marland Kitchen albuterol (PROVENTIL HFA;VENTOLIN HFA) 108 (90 Base) MCG/ACT inhaler Inhale 2 puffs into the lungs every 6 (six) hours as needed for wheezing or shortness of breath. (Patient not taking: Reported on 06/23/2019) 1 Inhaler 2  . HYDROcodone-homatropine (HYCODAN) 5-1.5 MG/5ML syrup Take 5 mLs by mouth every 6 (six) hours as needed for cough. (Patient not taking: Reported on 06/23/2019) 120 mL 0  . levofloxacin (LEVAQUIN) 500 MG tablet Take 1 tablet (500 mg total) by mouth daily. (Patient not taking: Reported on 06/23/2019) 7 tablet 0   No facility-administered medications prior to visit.    No Known Allergies  Review of Systems  Respiratory: Positive for apnea, cough (smoker) and shortness of breath (during his sleep). Negative for choking, chest tightness and wheezing.   Cardiovascular: Negative for chest pain.  Gastrointestinal: Positive for abdominal pain. Negative for blood in stool, constipation, diarrhea, nausea and vomiting.  Genitourinary: Negative for dysuria, frequency and hematuria.  Neurological: Negative for dizziness and headaches.       Objective:    Physical Exam Constitutional:      General: He is not in acute distress.    Appearance: He is well-developed.  HENT:     Head: Normocephalic and atraumatic.     Right Ear: Hearing normal.     Left Ear: Hearing normal.     Nose: Nose normal.     Mouth/Throat:     Mouth: Mucous membranes are moist.     Pharynx: Oropharynx is clear.  Eyes:     General: Lids are normal. No scleral icterus.       Right eye: No discharge.        Left eye: No discharge.     Extraocular Movements: Extraocular movements intact.     Conjunctiva/sclera: Conjunctivae normal.  Cardiovascular:     Rate and Rhythm: Normal rate and regular rhythm.  Pulmonary:     Effort: Pulmonary  effort is normal. No respiratory distress.     Breath sounds: Normal breath sounds.  Abdominal:     General: Abdomen is flat. Bowel sounds are normal.     Palpations: Abdomen is soft. There is no shifting dullness, hepatomegaly, splenomegaly or mass.     Tenderness: There is no abdominal tenderness.     Hernia: No hernia  is present.  Musculoskeletal:        General: Normal range of motion.  Skin:    Findings: No lesion or rash.  Neurological:     Mental Status: He is alert and oriented to person, place, and time.  Psychiatric:        Speech: Speech normal.        Behavior: Behavior normal.        Thought Content: Thought content normal.     BP 130/70 (BP Location: Right Arm, Patient Position: Sitting, Cuff Size: Normal)   Pulse 65   Temp (!) 97.3 F (36.3 C) (Skin)   Wt 199 lb (90.3 kg)   SpO2 97%   BMI 26.99 kg/m  Wt Readings from Last 3 Encounters:  09/26/19 199 lb (90.3 kg)  08/09/19 200 lb (90.7 kg)  06/23/19 200 lb (90.7 kg)    Health Maintenance Due  Topic Date Due  . COVID-19 Vaccine (1) Never done  . COLONOSCOPY  11/01/2017  . PNA vac Low Risk Adult (1 of 2 - PCV13) Never done    There are no preventive care reminders to display for this patient.   Lab Results  Component Value Date   TSH 2.210 06/23/2019   Lab Results  Component Value Date   WBC 7.2 06/23/2019   HGB 15.8 06/23/2019   HCT 45.7 06/23/2019   MCV 99 (H) 06/23/2019   PLT 240 06/23/2019   Lab Results  Component Value Date   NA 142 06/23/2019   K 4.5 06/23/2019   CO2 22 06/23/2019   GLUCOSE 84 06/23/2019   BUN 11 06/23/2019   CREATININE 1.00 06/23/2019   BILITOT 0.4 06/23/2019   ALKPHOS 143 (H) 06/23/2019   AST 17 06/23/2019   ALT 14 06/23/2019   PROT 6.6 06/23/2019   ALBUMIN 4.2 06/23/2019   CALCIUM 9.1 06/23/2019   Lab Results  Component Value Date   CHOL 188 06/23/2019   Lab Results  Component Value Date   HDL 37 (L) 06/23/2019   Lab Results  Component Value Date    LDLCALC 131 (H) 06/23/2019   Lab Results  Component Value Date   TRIG 112 06/23/2019   Lab Results  Component Value Date   CHOLHDL 5.1 (H) 04/08/2017   No results found for: HGBA1C     Assessment & Plan:   Problem List Items Addressed This Visit    1. Palpitations No arrhythmia, chest pains, dyspnea or peripheral edema at the present. Has noticed severe palpitations that awakens him from sleep at night frequently. Also, some fatigue/malaise without dizziness. Will schedule cardiology referral for [facility] Heart Monitor. - CBC with Differential/Platelet - Comprehensive metabolic panel - Magnesium - Ambulatory referral to Cardiology  2. Fatigue, unspecified type Frequent fatigue and general malaise. Will check for cardiac arrhythmia. May need sleep study, also pending follow up labs and cardiology referral. - CBC with Differential/Platelet - Comprehensive metabolic panel - Magnesium  3. Rib pain on right side Some slight dull ache in the left costal margin of the abdomen over the past several days. No known injury but feels it may have occurred with the large amount of shoveling he does at work. No bruising or rash. No organomegaly or masses. Apply moist heat and recheck prn.  4. Snoring States wife has witnessed significant snoring and episodes of apnea. Will fatigue problems and easily falling asleep, he probably needs a home sleep study. Would like for him to have a cardiology evaluation of palpitations and [facility]  Home Heart Monitoring first.       No orders of the defined types were placed in this encounter.  Juluis Mire, [place], [name], [place], have reviewed all documentation for this visit. The documentation on 09/26/19 for the exam, diagnosis, procedures, and orders are all accurate and complete.

## 2019-09-26 NOTE — Patient Instructions (Signed)

## 2019-09-26 NOTE — Telephone Encounter (Signed)
Pt. Reports he started having left sided abdominal pain. Hurts at his ribs. Denies any injury. Pain started 2-3 days ago. Pian is constant now. Pain 3-4/10. No fever or constipation or diarrhea. Requests an afternoon appointment.  Reason for Disposition . [1] MODERATE pain (e.g., interferes with normal activities) AND [2] pain comes and goes (cramps) AND [3] present > 24 hours  (Exception: pain with Vomiting or Diarrhea - see that Guideline)  Answer Assessment - Initial Assessment Questions 1. LOCATION: "Where does it hurt?"      Left side of abd. At ribs 2. RADIATION: "Does the pain shoot anywhere else?" (e.g., chest, back)     No 3. ONSET: "When did the pain begin?" (Minutes, hours or days ago)      2-3 days ago 4. SUDDEN: "Gradual or sudden onset?"     Gradual 5. PATTERN "Does the pain come and go, or is it constant?"    - If constant: "Is it getting better, staying the same, or worsening?"      (Note: Constant means the pain never goes away completely; most serious pain is constant and it progresses)     - If intermittent: "How long does it last?" "Do you have pain now?"     (Note: Intermittent means the pain goes away completely between bouts)     Constant 6. SEVERITY: "How bad is the pain?"  (e.g., Scale 1-10; mild, moderate, or severe)    - MILD (1-3): doesn't interfere with normal activities, abdomen soft and not tender to touch     - MODERATE (4-7): interferes with normal activities or awakens from sleep, tender to touch     - SEVERE (8-10): excruciating pain, doubled over, unable to do any normal activities       3-4 7. RECURRENT SYMPTOM: "Have you ever had this type of abdominal pain before?" If so, ask: "When was the last time?" and "What happened that time?"      No 8. CAUSE: "What do you think is causing the abdominal pain?"     Unsure 9. RELIEVING/AGGRAVATING FACTORS: "What makes it better or worse?" (e.g., movement, antacids, bowel movement)     No 10. OTHER SYMPTOMS:  "Has there been any vomiting, diarrhea, constipation, or urine problems?"       Sweats at night  Protocols used: ABDOMINAL PAIN - MALE-A-AH

## 2019-09-27 DIAGNOSIS — R5383 Other fatigue: Secondary | ICD-10-CM | POA: Diagnosis not present

## 2019-09-27 DIAGNOSIS — R002 Palpitations: Secondary | ICD-10-CM | POA: Diagnosis not present

## 2019-09-28 LAB — COMPREHENSIVE METABOLIC PANEL
ALT: 16 IU/L (ref 0–44)
AST: 17 IU/L (ref 0–40)
Albumin/Globulin Ratio: 2 (ref 1.2–2.2)
Albumin: 4.1 g/dL (ref 3.8–4.8)
Alkaline Phosphatase: 130 IU/L — ABNORMAL HIGH (ref 39–117)
BUN/Creatinine Ratio: 13 (ref 10–24)
BUN: 13 mg/dL (ref 8–27)
Bilirubin Total: 0.5 mg/dL (ref 0.0–1.2)
CO2: 20 mmol/L (ref 20–29)
Calcium: 8.9 mg/dL (ref 8.6–10.2)
Chloride: 105 mmol/L (ref 96–106)
Creatinine, Ser: 1.04 mg/dL (ref 0.76–1.27)
GFR calc Af Amer: 87 mL/min/{1.73_m2} (ref >59)
GFR calc non Af Amer: 75 mL/min/{1.73_m2} (ref >59)
Globulin, Total: 2.1 g/dL (ref 1.5–4.5)
Glucose: 92 mg/dL (ref 65–99)
Potassium: 4 mmol/L (ref 3.5–5.2)
Sodium: 142 mmol/L (ref 134–144)
Total Protein: 6.2 g/dL (ref 6.0–8.5)

## 2019-09-28 LAB — CBC WITH DIFFERENTIAL/PLATELET
Basophils Absolute: 0.1 10*3/uL (ref 0.0–0.2)
Basos: 1 %
EOS (ABSOLUTE): 0.2 10*3/uL (ref 0.0–0.4)
Eos: 3 %
Hematocrit: 42.3 % (ref 37.5–51.0)
Hemoglobin: 14.8 g/dL (ref 13.0–17.7)
Immature Grans (Abs): 0 10*3/uL (ref 0.0–0.1)
Immature Granulocytes: 0 %
Lymphocytes Absolute: 1.3 10*3/uL (ref 0.7–3.1)
Lymphs: 20 %
MCH: 34.4 pg — ABNORMAL HIGH (ref 26.6–33.0)
MCHC: 35 g/dL (ref 31.5–35.7)
MCV: 98 fL — ABNORMAL HIGH (ref 79–97)
Monocytes Absolute: 0.5 10*3/uL (ref 0.1–0.9)
Monocytes: 8 %
Neutrophils Absolute: 4.3 10*3/uL (ref 1.4–7.0)
Neutrophils: 68 %
Platelets: 224 10*3/uL (ref 150–450)
RBC: 4.3 x10E6/uL (ref 4.14–5.80)
RDW: 12.2 % (ref 11.6–15.4)
WBC: 6.4 10*3/uL (ref 3.4–10.8)

## 2019-09-28 LAB — MAGNESIUM: Magnesium: 2 mg/dL (ref 1.6–2.3)

## 2019-09-29 ENCOUNTER — Ambulatory Visit (INDEPENDENT_AMBULATORY_CARE_PROVIDER_SITE_OTHER): Payer: Medicare HMO | Admitting: Cardiology

## 2019-09-29 ENCOUNTER — Encounter: Payer: Self-pay | Admitting: Cardiology

## 2019-09-29 ENCOUNTER — Ambulatory Visit (INDEPENDENT_AMBULATORY_CARE_PROVIDER_SITE_OTHER): Payer: Medicare HMO

## 2019-09-29 ENCOUNTER — Other Ambulatory Visit: Payer: Self-pay

## 2019-09-29 VITALS — BP 138/72 | HR 54 | Ht 71.0 in | Wt 197.0 lb

## 2019-09-29 DIAGNOSIS — F17200 Nicotine dependence, unspecified, uncomplicated: Secondary | ICD-10-CM

## 2019-09-29 DIAGNOSIS — R0683 Snoring: Secondary | ICD-10-CM

## 2019-09-29 DIAGNOSIS — R002 Palpitations: Secondary | ICD-10-CM | POA: Diagnosis not present

## 2019-09-29 DIAGNOSIS — R0600 Dyspnea, unspecified: Secondary | ICD-10-CM | POA: Diagnosis not present

## 2019-09-29 DIAGNOSIS — R69 Illness, unspecified: Secondary | ICD-10-CM | POA: Diagnosis not present

## 2019-09-29 LAB — ECHOCARDIOGRAM COMPLETE
Height: 71 in
Weight: 3152 oz

## 2019-09-29 MED ORDER — PERFLUTREN LIPID MICROSPHERE
1.0000 mL | INTRAVENOUS | Status: AC | PRN
  Administered 2019-09-29: 2 mL via INTRAVENOUS

## 2019-09-29 NOTE — Patient Instructions (Signed)
Medication Instructions:  Your physician recommends that you continue on your current medications as directed. Please refer to the Current Medication list given to you today.  *If you need a refill on your cardiac medications before your next appointment, please call your pharmacy*   Lab Work: None ordered If you have labs (blood work) drawn today and your tests are completely normal, you will receive your results only by: Marland Kitchen MyChart Message (if you have MyChart) OR . A paper copy in the mail If you have any lab test that is abnormal or we need to change your treatment, we will call you to review the results.   Testing/Procedures: Your physician has requested that you have an echocardiogram. Echocardiography is a painless test that uses sound waves to create images of your heart. It provides your doctor with information about the size and shape of your heart and how well your heart's chambers and valves are working. This procedure takes approximately one hour. There are no restrictions for this procedure.  Your physician has recommended that you wear a Zio monitor. This monitor is a medical device that records the heart's electrical activity. Doctors most often use these monitors to diagnose arrhythmias. Arrhythmias are problems with the speed or rhythm of the heartbeat. The monitor is a small device applied to your chest. You can wear one while you do your normal daily activities. While wearing this monitor if you have any symptoms to push the button and record what you felt. Once you have worn this monitor for the period of time provider prescribed (Usually 14 days), you will return the monitor device in the postage paid box. Once it is returned they will download the data collected and provide [place] with a report which the provider will then review and we will call you with those results. Important tips:  1. Avoid showering during the first 24 hours of wearing the monitor. 2. Avoid excessive  sweating to help maximize wear time. 3. Do not submerge the device, no hot tubs, and no swimming pools. 4. Keep any lotions or oils away from the patch. 5. After 24 hours you may shower with the patch on. Take brief showers with your back facing the shower head.  6. Do not remove patch once it has been placed because that will interrupt data and decrease adhesive wear time. 7. Push the button when you have any symptoms and write down what you were feeling. 8. Once you have completed wearing your monitor, remove and place into box which has postage paid and place in your outgoing mailbox.  9. If for some reason you have misplaced your box then call our office and we can provide another box and/or mail it off for you.         Follow-Up: At [facility], you and your health needs are our priority.  As part of our continuing mission to provide you with exceptional heart care, we have created designated Provider Care Teams.  These Care Teams include your primary Cardiologist (physician) and Advanced Practice Providers (APPs -  Physician Assistants and Nurse Practitioners) who all work together to provide you with the care you need, when you need it.  We recommend signing up for the patient portal called "MyChart".  Sign up information is provided on this After Visit Summary.  MyChart is used to connect with patients for Virtual Visits (Telemedicine).  Patients are able to view lab/test results, encounter notes, upcoming appointments, etc.  Non-urgent messages can be sent  to your provider as well.   To learn more about what you can do with MyChart, go to [email or web address].    Your next appointment:   5 week(s)  The format for your next appointment:   In Person   You have been referred to [facility]. Their office will contact you directly to schedule.   Provider:    You may see Kate Sable, MD or one of the following Advanced Practice Providers on your designated Care  Team:    Murray Hodgkins, NP  Christell Faith, PA-C  Marrianne Mood, PA-C    Other Instructions N/A

## 2019-09-29 NOTE — Progress Notes (Signed)
Cardiology Office Note:    Date:  09/29/2019   ID:  Thomas Cross, DOB [birth year], MRN [id number]  PCP:  Margo Common, PA  Cardiologist:  Kate Sable, MD  Electrophysiologist:  None   Referring MD: Margo Common, PA   Chief Complaint  Patient presents with  . New Patient (Initial Visit)    Pt concerned w/ increase of heart rate/ chest pain/ SOB/ fatigue/ pain near lower rib cage on Left side some times leading into the back. Meds verbally reviewed w/ pt.     History of Present Illness:    Thomas Cross is a 64 y.o. male with a hx of GERD, arthritis, current smoker x40+ years who presents due to palpitations.  Patient states having symptoms of palpitations/increased heart rates for over a year now.  Symptoms usually occur at night, waking him up from sleep.  He has symptoms about 2-3 times a week.  Symptoms usually last about 2 to 3 minutes.  He has also noted occasional fluttering in his heart over the past 8 months.  He endorses snoring, his wife has told him he sometimes stop breathing at night.  He endorses daytime fatigue.  He denies any history of heart disease.  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  Past Medical History:  Diagnosis Date  . Arthritis   . GERD (gastroesophageal reflux disease)    RARE-TUMS    Past Surgical History:  Procedure Laterality Date  . COLONOSCOPY  2009  . HERNIA REPAIR  30 years ago   right inguinal  . INGUINAL HERNIA REPAIR Right 05/20/2016   Procedure: LAPAROSCOPIC INGUINAL HERNIA;  Surgeon: Christene Lye, MD;  Location: [facility] ORS;  Service: General;  Laterality: Right;  . TONSILLECTOMY AND ADENOIDECTOMY      Current Medications: Current Meds  Medication Sig  . aspirin EC 81 MG tablet Take 81 mg by mouth every evening.   . calcium carbonate (TUMS - DOSED IN MG ELEMENTAL CALCIUM) 500 MG chewable  tablet Chew 1 tablet by mouth as needed for indigestion or heartburn.  . Cholecalciferol 1000 units capsule Take 1,000 Units by mouth every evening.   . Multiple Vitamins-Minerals (MULTIVITAMIN WITH MINERALS) tablet Take 1 tablet by mouth every evening.   . Omega-3 Fatty Acids (FISH OIL) 1000 MG CPDR Take 1 capsule by mouth every evening.   Marland Kitchen VITAMIN D PO Take by mouth.  . Vitamins-Lipotropics (LIPOFLAVONOID) TABS Take 1 tablet by mouth every evening.      Allergies:   Patient has no known allergies.   Social History   Socioeconomic History  . Marital status: Married    Spouse name: Not on file  . Number of children: Not on file  . Years of education: Not on file  . Highest education level: Not on file  Occupational History  . Not on file  Tobacco Use  . Smoking status: Current Every Day Smoker    Packs/day: 1.50    Years: 45.00    Pack years: 67.50    Types: Cigarettes  . Smokeless tobacco: Never Used  Substance and Sexual Activity  . Alcohol use: Yes    Alcohol/week: 0.0 standard drinks    Comment: MIXED DRINKS EVERY DAY  . Drug use: No  . Sexual activity: Not on file  Other Topics Concern  . Not on file  Social History Narrative  .  Not on file   Social Determinants of Health   Financial Resource Strain:   . Difficulty of Paying Living Expenses:   Food Insecurity:   . Worried About Charity fundraiser in the Last Year:   . Arboriculturist in the Last Year:   Transportation Needs:   . Film/video editor (Medical):   Marland Kitchen Lack of Transportation (Non-Medical):   Physical Activity:   . Days of Exercise per Week:   . Minutes of Exercise per Session:   Stress:   . Feeling of Stress :   Social Connections:   . Frequency of Communication with Friends and Family:   . Frequency of Social Gatherings with Friends and Family:   . Attends Religious Services:   . Active Member of Clubs or Organizations:   . Attends Archivist Meetings:   Marland Kitchen Marital Status:        Family History: The patient's family history includes Arthritis in his mother; Bladder Cancer in his mother; CVA in his father; Colon cancer in his paternal grandfather; Dementia in his father; Heart attack in his brother and father; Heart disease in his maternal grandfather and mother; Hyperlipidemia in his brother, brother, brother, brother, and sister; Hypertension in his father; Kidney cancer in his mother; Prostate cancer in his father; Stroke in his paternal grandmother.  ROS:   Please see the history of present illness.     All other systems reviewed and are negative.  EKGs/Labs/Other Studies Reviewed:    The following studies were reviewed today:   EKG:  EKG is  ordered today.  The ekg ordered today demonstrates sinus bradycardia, sinus arrhythmias, otherwise normal ECG.  Recent Labs: 06/23/2019: TSH 2.210 09/27/2019: ALT 16; BUN 13; Creatinine, Ser 1.04; Hemoglobin 14.8; Magnesium 2.0; Platelets 224; Potassium 4.0; Sodium 142  Recent Lipid Panel    Component Value Date/Time   CHOL 188 06/23/2019 1023   TRIG 112 06/23/2019 1023   HDL 37 (L) 06/23/2019 1023   CHOLHDL 5.1 (H) 04/08/2017 0816   LDLCALC 131 (H) 06/23/2019 1023   LDLCALC 140 (H) 04/08/2017 0816    Physical Exam:    VS:  BP 138/72 (BP Location: Right Arm, Patient Position: Sitting, Cuff Size: Normal)   Pulse (!) 54   Ht 5\' 11"  (1.803 m)   Wt 197 lb (89.4 kg)   SpO2 98%   BMI 27.48 kg/m     Wt Readings from Last 3 Encounters:  09/29/19 197 lb (89.4 kg)  09/26/19 199 lb (90.3 kg)  08/09/19 200 lb (90.7 kg)     GEN:  Well nourished, well developed in no acute distress HEENT: Normal NECK: No JVD; No carotid bruits LYMPHATICS: No lymphadenopathy CARDIAC: RRR, no murmurs, rubs, gallops RESPIRATORY:  Clear to auscultation without rales, wheezing or rhonchi  ABDOMEN: Soft, non-tender, non-distended MUSCULOSKELETAL:  No edema; No deformity  SKIN: Warm and dry NEUROLOGIC:  Alert and oriented x  3 PSYCHIATRIC:  Normal affect   ASSESSMENT:    1. Palpitations   2. Dyspnea, unspecified type   3. Smoking   4. Snoring    PLAN:     In order of problems listed above:  1. Patient with frequent palpitations occurring 2-3 times a week waking him up from sleep.  Will evaluate with a 2-week cardiac monitor for significant arrhythmias. 2. He has shortness of breath typically at his work.  Also endorses daytime fatigue which could be due to sleep apnea.  Will get echocardiogram to evaluate  for any structural dysfunction. 3. Current smoker x40+ years.  Smoking cessation advised. 4. Patient endorses snoring, daytime fatigue.  Will refer to pulmonary medicine for sleep study and management if diagnosed with OSA.  Follow-up after echocardiogram and cardiac monitor.  This note was generated in part or whole with voice recognition software. Voice recognition is usually quite accurate but there are transcription errors that can and very often do occur. I apologize for any typographical errors that were not detected and corrected.  Medication Adjustments/Labs and Tests Ordered: Current medicines are reviewed at length with the patient today.  Concerns regarding medicines are outlined above.  Orders Placed This Encounter  Procedures  . Ambulatory referral to Pulmonology  . LONG TERM MONITOR (3-14 DAYS)  . EKG 12-Lead  . ECHOCARDIOGRAM COMPLETE   No orders of the defined types were placed in this encounter.   Patient Instructions  Medication Instructions:  Your physician recommends that you continue on your current medications as directed. Please refer to the Current Medication list given to you today.  *If you need a refill on your cardiac medications before your next appointment, please call your pharmacy*   Lab Work: None ordered If you have labs (blood work) drawn today and your tests are completely normal, you will receive your results only by: Marland Kitchen MyChart Message (if you have  MyChart) OR . A paper copy in the mail If you have any lab test that is abnormal or we need to change your treatment, we will call you to review the results.   Testing/Procedures: Your physician has requested that you have an echocardiogram. Echocardiography is a painless test that uses sound waves to create images of your heart. It provides your doctor with information about the size and shape of your heart and how well your heart's chambers and valves are working. This procedure takes approximately one hour. There are no restrictions for this procedure.  Your physician has recommended that you wear a Zio monitor. This monitor is a medical device that records the heart's electrical activity. Doctors most often use these monitors to diagnose arrhythmias. Arrhythmias are problems with the speed or rhythm of the heartbeat. The monitor is a small device applied to your chest. You can wear one while you do your normal daily activities. While wearing this monitor if you have any symptoms to push the button and record what you felt. Once you have worn this monitor for the period of time provider prescribed (Usually 14 days), you will return the monitor device in the postage paid box. Once it is returned they will download the data collected and provide [place] with a report which the provider will then review and we will call you with those results. Important tips:  1. Avoid showering during the first 24 hours of wearing the monitor. 2. Avoid excessive sweating to help maximize wear time. 3. Do not submerge the device, no hot tubs, and no swimming pools. 4. Keep any lotions or oils away from the patch. 5. After 24 hours you may shower with the patch on. Take brief showers with your back facing the shower head.  6. Do not remove patch once it has been placed because that will interrupt data and decrease adhesive wear time. 7. Push the button when you have any symptoms and write down what you were  feeling. 8. Once you have completed wearing your monitor, remove and place into box which has postage paid and place in your outgoing mailbox.  9. If for  some reason you have misplaced your box then call our office and we can provide another box and/or mail it off for you.         Follow-Up: At [facility], you and your health needs are our priority.  As part of our continuing mission to provide you with exceptional heart care, we have created designated Provider Care Teams.  These Care Teams include your primary Cardiologist (physician) and Advanced Practice Providers (APPs -  Physician Assistants and Nurse Practitioners) who all work together to provide you with the care you need, when you need it.  We recommend signing up for the patient portal called "MyChart".  Sign up information is provided on this After Visit Summary.  MyChart is used to connect with patients for Virtual Visits (Telemedicine).  Patients are able to view lab/test results, encounter notes, upcoming appointments, etc.  Non-urgent messages can be sent to your provider as well.   To learn more about what you can do with MyChart, go to [email or web address].    Your next appointment:   5 week(s)  The format for your next appointment:   In Person   You have been referred to [facility]. Their office will contact you directly to schedule.   Provider:    You may see Kate Sable, MD or one of the following Advanced Practice Providers on your designated Care Team:    Murray Hodgkins, NP  Christell Faith, PA-C  Marrianne Mood, PA-C    Other Instructions N/A     Signed, Kate Sable, MD  09/29/2019 12:39 PM    [place]

## 2019-11-02 DIAGNOSIS — R002 Palpitations: Secondary | ICD-10-CM | POA: Diagnosis not present

## 2019-11-03 ENCOUNTER — Other Ambulatory Visit: Payer: Self-pay

## 2019-11-03 ENCOUNTER — Encounter: Payer: Self-pay | Admitting: Cardiology

## 2019-11-03 ENCOUNTER — Ambulatory Visit: Payer: Medicare HMO | Admitting: Cardiology

## 2019-11-03 VITALS — BP 112/70 | HR 56 | Ht 71.0 in | Wt 196.1 lb

## 2019-11-03 DIAGNOSIS — R002 Palpitations: Secondary | ICD-10-CM | POA: Diagnosis not present

## 2019-11-03 DIAGNOSIS — R0683 Snoring: Secondary | ICD-10-CM

## 2019-11-03 DIAGNOSIS — R0600 Dyspnea, unspecified: Secondary | ICD-10-CM | POA: Diagnosis not present

## 2019-11-03 DIAGNOSIS — F17200 Nicotine dependence, unspecified, uncomplicated: Secondary | ICD-10-CM | POA: Diagnosis not present

## 2019-11-03 DIAGNOSIS — R69 Illness, unspecified: Secondary | ICD-10-CM | POA: Diagnosis not present

## 2019-11-03 NOTE — Patient Instructions (Signed)
Medication Instructions:   Your physician recommends that you continue on your current medications as directed. Please refer to the Current Medication list given to you today.  *If you need a refill on your cardiac medications before your next appointment, please call your pharmacy*   Lab Work: None Ordered. If you have labs (blood work) drawn today and your tests are completely normal, you will receive your results only by: Marland Kitchen MyChart Message (if you have MyChart) OR . A paper copy in the mail If you have any lab test that is abnormal or we need to change your treatment, we will call you to review the results.   Testing/Procedures: None Ordered.   Follow-Up: At [facility], you and your health needs are our priority.  As part of our continuing mission to provide you with exceptional heart care, we have created designated Provider Care Teams.  These Care Teams include your primary Cardiologist (physician) and Advanced Practice Providers (APPs -  Physician Assistants and Nurse Practitioners) who all work together to provide you with the care you need, when you need it.  We recommend signing up for the patient portal called "MyChart".  Sign up information is provided on this After Visit Summary.  MyChart is used to connect with patients for Virtual Visits (Telemedicine).  Patients are able to view lab/test results, encounter notes, upcoming appointments, etc.  Non-urgent messages can be sent to your provider as well.   To learn more about what you can do with MyChart, go to [email or web address].    Your next appointment:   1 year(s)  The format for your next appointment:   In Person  Provider:   Kate Sable, MD   Other Instructions N/A

## 2019-11-03 NOTE — Progress Notes (Signed)
Cardiology Office Note:    Date:  11/03/2019   ID:  Thomas Cross, DOB [birth year], MRN [id number]  PCP:  Margo Common, PA  Cardiologist:  Kate Sable, MD  Electrophysiologist:  None   Referring MD: Margo Common, PA   Chief Complaint  Patient presents with  . other    5 week follow up and discuss Zio monitor & Echo results. Meds reviewed by the pt. verbally. Pt. c/o palpitations and shortness of breath.     History of Present Illness:    Thomas Cross is a 64 y.o. male with a hx of GERD, arthritis, current smoker x40+ years who presents for follow-up.  He was last seen due to palpitations for about a year.  Cardiac monitor was placed to evaluate any significant arrhythmias.  Patient also endorsed snoring, daytime fatigue and somnolence.  Referral to sleep medicine was ordered.  Due to dyspnea, echocardiogram was ordered to evaluate any gross cardiac structural pathology.  Patient states doing okay, has not seen pulmonary medicine yet with regards to sleep apnea evaluation due to daytime somnolence, fatigue, snoring.   Past Medical History:  Diagnosis Date  . Arthritis   . GERD (gastroesophageal reflux disease)    RARE-TUMS    Past Surgical History:  Procedure Laterality Date  . COLONOSCOPY  2009  . HERNIA REPAIR  30 years ago   right inguinal  . INGUINAL HERNIA REPAIR Right 05/20/2016   Procedure: LAPAROSCOPIC INGUINAL HERNIA;  Surgeon: Christene Lye, MD;  Location: [facility] ORS;  Service: General;  Laterality: Right;  . TONSILLECTOMY AND ADENOIDECTOMY      Current Medications: Current Meds  Medication Sig  . aspirin EC 81 MG tablet Take 81 mg by mouth every evening.   . calcium carbonate (TUMS - DOSED IN MG ELEMENTAL CALCIUM) 500 MG chewable tablet Chew 1 tablet by mouth as needed for indigestion or heartburn.  . Cholecalciferol 1000 units capsule Take 1,000 Units by mouth every evening.   . Multiple Vitamins-Minerals (MULTIVITAMIN WITH MINERALS)  tablet Take 1 tablet by mouth every evening.   . Omega-3 Fatty Acids (FISH OIL) 1000 MG CPDR Take 1 capsule by mouth every evening.   Marland Kitchen VITAMIN D PO Take by mouth.  . Vitamins-Lipotropics (LIPOFLAVONOID) TABS Take 1 tablet by mouth every evening.      Allergies:   Patient has no known allergies.   Social History   Socioeconomic History  . Marital status: Married    Spouse name: Not on file  . Number of children: Not on file  . Years of education: Not on file  . Highest education level: Not on file  Occupational History  . Not on file  Tobacco Use  . Smoking status: Current Every Day Smoker    Packs/day: 1.50    Years: 45.00    Pack years: 67.50    Types: Cigarettes  . Smokeless tobacco: Never Used  Vaping Use  . Vaping Use: Never used  Substance and Sexual Activity  . Alcohol use: Yes    Alcohol/week: 0.0 standard drinks    Comment: MIXED DRINKS EVERY DAY  . Drug use: No  . Sexual activity: Not on file  Other Topics Concern  . Not on file  Social History Narrative  . Not on file   Social Determinants of Health   Financial Resource Strain:   . Difficulty of Paying Living Expenses:   Food Insecurity:   . Worried About Charity fundraiser in the Last Year:   .  Ran Out of Food in the Last Year:   Transportation Needs:   . Film/video editor (Medical):   Marland Kitchen Lack of Transportation (Non-Medical):   Physical Activity:   . Days of Exercise per Week:   . Minutes of Exercise per Session:   Stress:   . Feeling of Stress :   Social Connections:   . Frequency of Communication with Friends and Family:   . Frequency of Social Gatherings with Friends and Family:   . Attends Religious Services:   . Active Member of Clubs or Organizations:   . Attends Archivist Meetings:   Marland Kitchen Marital Status:      Family History: The patient's family history includes Arthritis in his mother; Bladder Cancer in his mother; CVA in his father; Colon cancer in his paternal  grandfather; Dementia in his father; Heart attack in his brother and father; Heart disease in his maternal grandfather and mother; Hyperlipidemia in his brother, brother, brother, brother, and sister; Hypertension in his father; Kidney cancer in his mother; Prostate cancer in his father; Stroke in his paternal grandmother.  ROS:   Please see the history of present illness.     All other systems reviewed and are negative.  EKGs/Labs/Other Studies Reviewed:    The following studies were reviewed today:   EKG:  EKG is  ordered today.  The ekg ordered today demonstrates sinus bradycardia, otherwise normal ECG.  Recent Labs: 06/23/2019: TSH 2.210 09/27/2019: ALT 16; BUN 13; Creatinine, Ser 1.04; Hemoglobin 14.8; Magnesium 2.0; Platelets 224; Potassium 4.0; Sodium 142  Recent Lipid Panel    Component Value Date/Time   CHOL 188 06/23/2019 1023   TRIG 112 06/23/2019 1023   HDL 37 (L) 06/23/2019 1023   CHOLHDL 5.1 (H) 04/08/2017 0816   LDLCALC 131 (H) 06/23/2019 1023   LDLCALC 140 (H) 04/08/2017 0816    Physical Exam:    VS:  BP 112/70 (BP Location: Left Arm, Patient Position: Sitting, Cuff Size: Normal)   Pulse (!) 56   Ht 5\' 11"  (1.803 m)   Wt 196 lb 2 oz (89 kg)   SpO2 98%   BMI 27.35 kg/m     Wt Readings from Last 3 Encounters:  11/03/19 196 lb 2 oz (89 kg)  09/29/19 197 lb (89.4 kg)  09/26/19 199 lb (90.3 kg)     GEN:  Well nourished, well developed in no acute distress HEENT: Normal NECK: No JVD; No carotid bruits LYMPHATICS: No lymphadenopathy CARDIAC: RRR, no murmurs, rubs, gallops RESPIRATORY:  Clear to auscultation without rales, wheezing or rhonchi  ABDOMEN: Soft, non-tender, non-distended MUSCULOSKELETAL:  No edema; No deformity  SKIN: Warm and dry NEUROLOGIC:  Alert and oriented x 3 PSYCHIATRIC:  Normal affect   ASSESSMENT:    1. Palpitations   2. Dyspnea, unspecified type   3. Smoking   4. Snoring    PLAN:     In order of problems listed  above:  1. Patient with frequent palpitations occurring 2-3 times a week waking him up from sleep.  2-week cardiac monitor did not show any significant arrhythmias.  One episode of paroxysmal SVT lasting 6 beats was noted not associated with patient triggered events.  Overall benign cardiac monitor.  Patient reassured. 2. Patient with history of shortness of breath.  Echocardiogram showed normal systolic and diastolic function, EF 55 to 60%.  Patient reassured.  Dyspnea and somnolence likely due to OSA versus smoking. 3. Current smoker x40+ years.  Smoking cessation advised. 4. Patient with  history of snoring, referral was made to pulmonary medicine after last visit but patient states not being called yet for an appointment.  We will place another referral, as patient still has symptoms of daytime somnolence, fatigue, snoring actually feeling sleepy during today's exam.  Follow-up in 1 year  This note was generated in part or whole with voice recognition software. Voice recognition is usually quite accurate but there are transcription errors that can and very often do occur. I apologize for any typographical errors that were not detected and corrected.  Medication Adjustments/Labs and Tests Ordered: Current medicines are reviewed at length with the patient today.  Concerns regarding medicines are outlined above.  Orders Placed This Encounter  Procedures  . EKG 12-Lead   No orders of the defined types were placed in this encounter.   Patient Instructions  Medication Instructions:   Your physician recommends that you continue on your current medications as directed. Please refer to the Current Medication list given to you today.  *If you need a refill on your cardiac medications before your next appointment, please call your pharmacy*   Lab Work: None Ordered. If you have labs (blood work) drawn today and your tests are completely normal, you will receive your results only by: Marland Kitchen MyChart  Message (if you have MyChart) OR . A paper copy in the mail If you have any lab test that is abnormal or we need to change your treatment, we will call you to review the results.   Testing/Procedures: None Ordered.   Follow-Up: At [facility], you and your health needs are our priority.  As part of our continuing mission to provide you with exceptional heart care, we have created designated Provider Care Teams.  These Care Teams include your primary Cardiologist (physician) and Advanced Practice Providers (APPs -  Physician Assistants and Nurse Practitioners) who all work together to provide you with the care you need, when you need it.  We recommend signing up for the patient portal called "MyChart".  Sign up information is provided on this After Visit Summary.  MyChart is used to connect with patients for Virtual Visits (Telemedicine).  Patients are able to view lab/test results, encounter notes, upcoming appointments, etc.  Non-urgent messages can be sent to your provider as well.   To learn more about what you can do with MyChart, go to [email or web address].    Your next appointment:   1 year(s)  The format for your next appointment:   In Person  Provider:   Kate Sable, MD   Other Instructions N/A      Signed, Kate Sable, MD  11/03/2019 12:46 PM    [place]

## 2019-11-08 ENCOUNTER — Ambulatory Visit: Payer: Medicare HMO | Admitting: Pulmonary Disease

## 2019-11-08 ENCOUNTER — Other Ambulatory Visit: Payer: Self-pay

## 2019-11-08 ENCOUNTER — Encounter: Payer: Self-pay | Admitting: Pulmonary Disease

## 2019-11-08 VITALS — BP 124/62 | HR 61 | Temp 97.7°F | Ht 71.0 in | Wt 195.0 lb

## 2019-11-08 DIAGNOSIS — J449 Chronic obstructive pulmonary disease, unspecified: Secondary | ICD-10-CM | POA: Diagnosis not present

## 2019-11-08 DIAGNOSIS — F17210 Nicotine dependence, cigarettes, uncomplicated: Secondary | ICD-10-CM | POA: Diagnosis not present

## 2019-11-08 DIAGNOSIS — R69 Illness, unspecified: Secondary | ICD-10-CM | POA: Diagnosis not present

## 2019-11-08 DIAGNOSIS — Z9189 Other specified personal risk factors, not elsewhere classified: Secondary | ICD-10-CM | POA: Diagnosis not present

## 2019-11-08 DIAGNOSIS — Z23 Encounter for immunization: Secondary | ICD-10-CM

## 2019-11-08 DIAGNOSIS — F17200 Nicotine dependence, unspecified, uncomplicated: Secondary | ICD-10-CM | POA: Insufficient documentation

## 2019-11-08 NOTE — Assessment & Plan Note (Signed)
67-pack-year smoking history Previous diagnosis of COPD based off the checks last x-ray in 2018 No formal PFTs  Plan: We will order pulmonary function testing Emphasized need to stop smoking, patient declined at this time he reports he will be willing to consider smoking cessation after completing sleep study and PFTs Pneumovax 23 today

## 2019-11-08 NOTE — Progress Notes (Signed)
@Patient  ID: Thomas Cross, male    DOB: [birth year], 65 y.o.   MRN: [id number]  Chief Complaint  Patient presents with  . sleep consult    per Dr. Garen Lah- no prior sleep study. c/o loud snoring, daytime sleepinss and gasping for air during sleep x5y    Referring provider: Margo Common, PA  HPI:  64 year old male current everyday smoker referred to our office in June/2021 for daytime sleepiness as well as suspected obstructive sleep apnea  PMH: Hyperlipidemia Smoker/ Smoking History: Current Smoker. 1.5 ppd. 67.5 pack year smoker  Maintenance: None Pt of: Needs [place] pulmonary provider  11/08/2019  - Visit   64 year old male current everyday smoker referred to our office in June/2021 for evaluation of daytime sleepiness as well as suspected obstructive sleep apnea.  Patient's Epworth score today is 8.  Patient is a current everyday smoker.  He smokes 1/2 packs/day.  67.5-pack-year smoking history.  He has never had pulmonary function testing before.  He does admit that he has had some worsened fatigue.  He is not able to do as much physical activity as he used to.  He also reports a productive cough most days.  He has been tried on inhalers by primary care before but he did not notice much improvement.  See sleep ROS listed below:  SLEEP ROS   Epworth score today: 8  Do you feel you have non-refreshing sleep?: yes Daytime sleepiness?: yes Witnessed apneas?: yes Loud snoring?: yes Choking or gasping episodes that wake pt up from sleep?: no Does patient experienced sleepiness passenger in a car, lying down to rest in the afternoons, sitting and reading, watching TV or other social situations?: sometimes Bedtime is typically around: 10- 11pm Sleep latency?: no  Which position does the patient sleep in: right side Nocturnal awakenings?: no  Nightmares?: no  Sleep talking?: no Restless Legs?: no When this patient out of bed in the morning?: 4-6 am When wake up to  you feel tired, treated any dryness in the mouth, morning headaches?: no    Questionaires / Pulmonary Flowsheets:   ACT:  No flowsheet data found.  MMRC: No flowsheet data found.  Epworth:  Results of the Epworth flowsheet 11/08/2019 09/26/2019  Sitting and reading 2 3  Watching TV 3 3  Sitting, inactive in a public place (e.g. a theatre or a meeting) 1 1  As a passenger in a car for an hour without a break 1 3  Lying down to rest in the afternoon when circumstances permit 1 2  Sitting and talking to someone 0 0  Sitting quietly after a lunch without alcohol 0 3  In a car, while stopped for a few minutes in traffic 0 0  Total score 8 15    Tests:   FENO:  No results found for: NITRICOXIDE  PFT: No flowsheet data found.  WALK:  No flowsheet data found.  Imaging: LONG TERM MONITOR (3-14 DAYS)  Result Date: 11/03/2019 Patient had a min HR of 43 bpm, max HR of 193 bpm, and avg HR of 69 bpm. Predominant underlying rhythm was Sinus Rhythm.  One episode of SVT noted lasting the longest lasting 6 beats.  Not associated with patient triggered events. Patient triggered events were associated with sinus rhythm.  Overall no significant arrhythmias. Isolated SVEs were rare (<1.0%), SVE Couplets were rare (<1.0%),   Lab Results:  CBC    Component Value Date/Time   WBC 6.4 09/27/2019 1002   WBC 7.1 04/08/2017  0816   RBC 4.30 09/27/2019 1002   RBC 4.51 04/08/2017 0816   HGB 14.8 09/27/2019 1002   HCT 42.3 09/27/2019 1002   PLT 224 09/27/2019 1002   MCV 98 (H) 09/27/2019 1002   MCH 34.4 (H) 09/27/2019 1002   MCH 33.5 (H) 04/08/2017 0816   MCHC 35.0 09/27/2019 1002   MCHC 35.0 04/08/2017 0816   RDW 12.2 09/27/2019 1002   LYMPHSABS 1.3 09/27/2019 1002   EOSABS 0.2 09/27/2019 1002   BASOSABS 0.1 09/27/2019 1002    BMET    Component Value Date/Time   NA 142 09/27/2019 1002   K 4.0 09/27/2019 1002   CL 105 09/27/2019 1002   CO2 20 09/27/2019 1002   GLUCOSE 92 09/27/2019  1002   GLUCOSE 87 04/08/2017 0816   BUN 13 09/27/2019 1002   CREATININE 1.04 09/27/2019 1002   CREATININE 0.95 04/08/2017 0816   CALCIUM 8.9 09/27/2019 1002   GFRNONAA 75 09/27/2019 1002   GFRNONAA 85 04/08/2017 0816   GFRAA 87 09/27/2019 1002   GFRAA 98 04/08/2017 0816    BNP No results found for: BNP  ProBNP No results found for: PROBNP  Specialty Problems      Pulmonary Problems   Suspected COPD     Per chest XR 03-2017         No Known Allergies  Immunization History  Administered Date(s) Administered  . Pneumococcal Polysaccharide-23 11/08/2019  . Tdap 03/18/2011    Past Medical History:  Diagnosis Date  . Arthritis   . GERD (gastroesophageal reflux disease)    RARE-TUMS    Tobacco History: Social History   Tobacco Use  Smoking Status Current Every Day Smoker  . Packs/day: 1.50  . Years: 45.00  . Pack years: 67.50  . Types: Cigarettes  Smokeless Tobacco Never Used   Ready to quit: No Counseling given: Yes  Smoking assessment and cessation counseling  Patient currently smoking: 1.5 ppd  I have advised the patient to quit/stop smoking as soon as possible due to high risk for multiple medical problems.  It will also be very difficult for [place] to manage patient's  respiratory symptoms and status if we continue to expose her lungs to a known irritant.  We do not advise e-cigarettes as a form of stopping smoking.  Patient is not willing to quit smoking.  Patient reports he will be willing to talk about smoking cessation after completing sleep study.  He would like to focus on 1 thing at a time.  I have advised the patient that we can assist and have options of nicotine replacement therapy, provided smoking cessation education today, provided smoking cessation counseling, and provided cessation resources.  Follow-up next office visit office visit for assessment of smoking cessation.    Smoking cessation counseling advised for: 8 min    Outpatient  Encounter Medications as of 11/08/2019  Medication Sig  . aspirin EC 81 MG tablet Take 81 mg by mouth every evening.   . calcium carbonate (TUMS - DOSED IN MG ELEMENTAL CALCIUM) 500 MG chewable tablet Chew 1 tablet by mouth as needed for indigestion or heartburn.  . Cholecalciferol 1000 units capsule Take 1,000 Units by mouth every evening.   . Multiple Vitamins-Minerals (MULTIVITAMIN WITH MINERALS) tablet Take 1 tablet by mouth every evening.   . Omega-3 Fatty Acids (FISH OIL) 1000 MG CPDR Take 1 capsule by mouth every evening.   Marland Kitchen VITAMIN D PO Take by mouth.  . Vitamins-Lipotropics (LIPOFLAVONOID) TABS Take 1 tablet by  mouth every evening.    No facility-administered encounter medications on file as of 11/08/2019.     Review of Systems  Review of Systems  Constitutional: Negative for activity change, chills, fatigue, fever and unexpected weight change.  HENT: Positive for congestion. Negative for postnasal drip, rhinorrhea, sinus pressure, sinus pain and sore throat.   Eyes: Negative.   Respiratory: Positive for cough and shortness of breath. Negative for wheezing.   Cardiovascular: Negative for chest pain and palpitations.  Gastrointestinal: Negative for constipation, diarrhea, nausea and vomiting.  Endocrine: Negative.   Genitourinary: Negative.   Musculoskeletal: Negative.   Skin: Negative.   Neurological: Negative for dizziness and headaches.  Psychiatric/Behavioral: Negative.  Negative for dysphoric mood. The patient is not nervous/anxious.   All other systems reviewed and are negative.    Physical Exam  BP 124/62 (BP Location: Left Arm, Cuff Size: Normal)   Pulse 61   Temp 97.7 F (36.5 C) (Temporal)   Ht 5\' 11"  (1.803 m)   Wt 195 lb (88.5 kg)   SpO2 97%   BMI 27.20 kg/m   Wt Readings from Last 5 Encounters:  11/08/19 195 lb (88.5 kg)  11/03/19 196 lb 2 oz (89 kg)  09/29/19 197 lb (89.4 kg)  09/26/19 199 lb (90.3 kg)  08/09/19 200 lb (90.7 kg)    BMI Readings  from Last 5 Encounters:  11/08/19 27.20 kg/m  11/03/19 27.35 kg/m  09/29/19 27.48 kg/m  09/26/19 26.99 kg/m  08/09/19 27.12 kg/m     Physical Exam Vitals and nursing note reviewed.  Constitutional:      General: He is not in acute distress.    Appearance: Normal appearance. He is normal weight.     Comments: Smells of tobacco  HENT:     Head: Normocephalic and atraumatic.     Right Ear: Hearing and external ear normal.     Left Ear: Hearing and external ear normal.     Nose: No mucosal edema.     Right Turbinates: Not enlarged.     Left Turbinates: Not enlarged.     Mouth/Throat:     Mouth: Mucous membranes are dry.     Pharynx: Oropharynx is clear. No oropharyngeal exudate.     Comments: MP 2  Eyes:     Pupils: Pupils are equal, round, and reactive to light.  Cardiovascular:     Rate and Rhythm: Normal rate and regular rhythm.     Pulses: Normal pulses.     Heart sounds: Normal heart sounds. No murmur heard.   Pulmonary:     Effort: Pulmonary effort is normal.     Breath sounds: Normal breath sounds. No decreased breath sounds, wheezing or rales.  Musculoskeletal:     Cervical back: Normal range of motion.     Right lower leg: No edema.     Left lower leg: No edema.  Lymphadenopathy:     Cervical: No cervical adenopathy.  Skin:    General: Skin is warm and dry.     Capillary Refill: Capillary refill takes less than 2 seconds.     Findings: No erythema or rash.  Neurological:     General: No focal deficit present.     Mental Status: He is alert and oriented to person, place, and time.     Motor: No weakness.     Coordination: Coordination normal.     Gait: Gait is intact. Gait normal.  Psychiatric:        Mood and Affect: Mood  normal.        Behavior: Behavior normal. Behavior is cooperative.        Thought Content: Thought content normal.        Judgment: Judgment normal.       Assessment & Plan:   At risk for obstructive sleep apnea Plan: We  will order home sleep study  Suspected COPD  67-pack-year smoking history Previous diagnosis of COPD based off the checks last x-ray in 2018 No formal PFTs  Plan: We will order pulmonary function testing Emphasized need to stop smoking, patient declined at this time he reports he will be willing to consider smoking cessation after completing sleep study and PFTs Pneumovax 23 today  Smoker Plan: Emphasized need to stop smoking Patient reports he would be willing to consider smoking cessation interventions after completing PFTs as well as sleep study Pneumovax 23 today    Return in about 3 months (around 02/08/2020), or if symptoms worsen or fail to improve, for [facility].   Lauraine Rinne, NP 11/08/2019   This appointment required 32 minutes of patient care (this includes precharting, chart review, review of results, face-to-face care, etc.).

## 2019-11-08 NOTE — Assessment & Plan Note (Signed)
Plan: Emphasized need to stop smoking Patient reports he would be willing to consider smoking cessation interventions after completing PFTs as well as sleep study Pneumovax 23 today

## 2019-11-08 NOTE — Assessment & Plan Note (Signed)
Plan: We will order home sleep study 

## 2019-11-08 NOTE — Patient Instructions (Addendum)
You were seen today by Lauraine Rinne, NP  for:   1. At risk for obstructive sleep apnea  - Home sleep test; Future  2. Smoker  We recommend that you stop smoking.  >>>You need to set a quit date >>>If you have friends or family who smoke, let them know you are trying to quit and not to smoke around you or in your living environment  Smoking Cessation Resources:  1 800 QUIT NOW  >>> Patient to call this resource and utilize it to help support her quit smoking >>> Keep up your hard work with stopping smoking  You can also contact the [facility] >>>For smoking cessation classes call [phone]  We do not recommend using e-cigarettes as a form of stopping smoking  You can sign up for smoking cessation support texts and information:  >>>https://smokefree.gov/smokefreetxt    3. Chronic obstructive pulmonary disease, unspecified COPD type ([place])  We will obtain pulmonary function testing to further evaluate your lung functioning  Note your daily symptoms > remember "red flags" for COPD:   >>>Increase in cough >>>increase in sputum production >>>increase in shortness of breath or activity  intolerance.   If you notice these symptoms, please call the office to be seen.     We recommend today:  Orders Placed This Encounter  Procedures  . Home sleep test    Standing Status:   Future    Standing Expiration Date:   11/07/2020    Order Specific Question:   Where should this test be performed:    Answer:   [facility] - Pul[facility]   Orders Placed This Encounter  Procedures  . Home sleep test   No orders of the defined types were placed in this encounter.   Follow Up:    Return in about 3 months (around 02/08/2020), or if symptoms worsen or fail to improve, for [facility].   Please do your part to reduce the spread of COVID-19:      Reduce your risk of any infection  and COVID19 by using the similar precautions used for avoiding the common  cold or flu:  Marland Kitchen Wash your hands often with soap and warm water for at least 20 seconds.  If soap and water are not readily available, use an alcohol-based hand sanitizer with at least 60% alcohol.  . If coughing or sneezing, cover your mouth and nose by coughing or sneezing into the elbow areas of your shirt or coat, into a tissue or into your sleeve (not your hands). Langley Gauss A MASK when in public  . Avoid shaking hands with others and consider head nods or verbal greetings only. . Avoid touching your eyes, nose, or mouth with unwashed hands.  . Avoid close contact with people who are sick. . Avoid places or events with large numbers of people in one location, like concerts or sporting events. . If you have some symptoms but not all symptoms, continue to monitor at home and seek medical attention if your symptoms worsen. . If you are having a medical emergency, call 911.   [place] / e-Visit: [email or web address]         [facility]: [phone]  [facility]: [phone]                   [facility]: [phone]     It is flu season:   >>> Best ways  to protect herself from the flu: Receive the yearly flu vaccine, practice good hand hygiene washing with soap and also using hand sanitizer when available, eat a nutritious meals, get adequate rest, hydrate appropriately   Please contact the office if your symptoms worsen or you have concerns that you are not improving.   Thank you for choosing [facility] for your healthcare, and for allowing [place] to partner with you on your healthcare journey. I am thankful to be able to provide care to you today.   Wyn Quaker FNP-C    Health Risks of Smoking Smoking cigarettes is very bad for your health. Tobacco smoke has over 200 known poisons in it. It contains the poisonous gases nitrogen oxide  and carbon monoxide. There are over 60 chemicals in tobacco smoke that cause cancer. Smoking is difficult to quit because a chemical in tobacco, called nicotine, causes addiction or dependence. When you smoke and inhale, nicotine is absorbed rapidly into the bloodstream through your lungs. Both inhaled and non-inhaled nicotine may be addictive. What are the risks of cigarette smoke? Cigarette smokers have an increased risk of many serious medical problems, including:  Lung cancer.  Lung disease, such as pneumonia, bronchitis, and emphysema.  Chest pain (angina) and heart attack because the heart is not getting enough oxygen.  Heart disease and peripheral blood vessel disease.  High blood pressure (hypertension).  Stroke.  Oral cancer, including cancer of the lip, mouth, or voice box.  Bladder cancer.  Pancreatic cancer.  Cervical cancer.  Pregnancy complications, including premature birth.  Stillbirths and smaller newborn babies, birth defects, and genetic damage to sperm.  Early menopause.  Lower estrogen level for women.  Infertility.  Facial wrinkles.  Blindness.  Increased risk of broken bones (fractures).  Senile dementia.  Stomach ulcers and internal bleeding.  Delayed wound healing and increased risk of complications during surgery.  Even smoking lightly shortens your life expectancy by several years. Because of secondhand smoke exposure, children of smokers have an increased risk of the following:  Sudden infant death syndrome (SIDS).  Respiratory infections.  Lung cancer.  Heart disease.  Ear infections. What are the benefits of quitting? There are many health benefits of quitting smoking. Here are some of them:  Within days of quitting smoking, your risk of having a heart attack decreases, your blood flow improves, and your lung capacity improves. Blood pressure, pulse rate, and breathing patterns start returning to normal soon after  quitting.  Within months, your lungs may clear up completely.  Quitting for 10 years reduces your risk of developing lung cancer and heart disease to almost that of a nonsmoker.  People who quit may see an improvement in their overall quality of life. How do I quit smoking?     Smoking is an addiction with both physical and psychological effects, and longtime habits can be hard to change. Your health care provider can recommend:  Programs and community resources, which may include group support, education, or talk therapy.  Prescription medicines to help reduce cravings.  Nicotine replacement products, such as patches, gum, and nasal sprays. Use these products only as directed. Do not replace cigarette smoking with electronic cigarettes, which are commonly called e-cigarettes. The safety of e-cigarettes is not known, and some may contain harmful chemicals.  A combination of two or more of these methods. Where to find more information  American Lung Association: www.lung.org  American Cancer Society: www.cancer.org Summary  Smoking cigarettes is very bad for your health.  Cigarette smokers have an increased risk of many serious medical problems, including several cancers, heart disease, and stroke.  Smoking is an addiction with both physical and psychological effects, and longtime habits can be hard to change.  By stopping right away, you can greatly reduce the risk of medical problems for you and your family.  To help you quit smoking, your health care provider can recommend programs, community resources, prescription medicines, and nicotine replacement products such as patches, gum, and nasal sprays. This information is not intended to replace advice given to you by your health care provider. Make sure you discuss any questions you have with your health care provider. Document Revised: 08/06/2017 Document Reviewed: 05/09/2016 Elsevier Patient Education  [place].   Sleep Apnea Sleep apnea affects breathing during sleep. It causes breathing to stop for a short time or to become shallow. It can also increase the risk of:  Heart attack.  Stroke.  Being very overweight (obese).  Diabetes.  Heart failure.  Irregular heartbeat. The goal of treatment is to help you breathe normally again. What are the causes? There are three kinds of sleep apnea:  Obstructive sleep apnea. This is caused by a blocked or collapsed airway.  Central sleep apnea. This happens when the brain does not send the right signals to the muscles that control breathing.  Mixed sleep apnea. This is a combination of obstructive and central sleep apnea. The most common cause of this condition is a collapsed or blocked airway. This can happen if:  Your throat muscles are too relaxed.  Your tongue and tonsils are too large.  You are overweight.  Your airway is too small. What increases the risk?  Being overweight.  Smoking.  Having a small airway.  Being older.  Being male.  Drinking alcohol.  Taking medicines to calm yourself (sedatives or tranquilizers).  Having family members with the condition. What are the signs or symptoms?  Trouble staying asleep.  Being sleepy or tired during the day.  Getting angry a lot.  Loud snoring.  Headaches in the morning.  Not being able to focus your mind (concentrate).  Forgetting things.  Less interest in sex.  Mood swings.  Personality changes.  Feelings of sadness (depression).  Waking up a lot during the night to pee (urinate).  Dry mouth.  Sore throat. How is this diagnosed?  Your medical history.  A physical exam.  A test that is done when you are sleeping (sleep study). The test is most often done in a sleep lab but may also be done at home. How is this treated?   Sleeping on your side.  Using a medicine to get rid of mucus in your nose (decongestant).  Avoiding the use of  alcohol, medicines to help you relax, or certain pain medicines (narcotics).  Losing weight, if needed.  Changing your diet.  Not smoking.  Using a machine to open your airway while you sleep, such as: ? An oral appliance. This is a mouthpiece that shifts your lower jaw forward. ? A CPAP device. This device blows air through a mask when you breathe out (exhale). ? An EPAP device. This has valves that you put in each nostril. ? A BPAP device. This device blows air through a mask when you breathe in (inhale) and breathe out.  Having surgery if other treatments do not work. It is important to get treatment for sleep apnea. Without treatment, it can lead to:  High blood pressure.  Coronary  artery disease.  In men, not being able to have an erection (impotence).  Reduced thinking ability. Follow these instructions at home: Lifestyle  Make changes that your doctor recommends.  Eat a healthy diet.  Lose weight if needed.  Avoid alcohol, medicines to help you relax, and some pain medicines.  Do not use any products that contain nicotine or tobacco, such as cigarettes, e-cigarettes, and chewing tobacco. If you need help quitting, ask your doctor. General instructions  Take over-the-counter and prescription medicines only as told by your doctor.  If you were given a machine to use while you sleep, use it only as told by your doctor.  If you are having surgery, make sure to tell your doctor you have sleep apnea. You may need to bring your device with you.  Keep all follow-up visits as told by your doctor. This is important. Contact a doctor if:  The machine that you were given to use during sleep bothers you or does not seem to be working.  You do not get better.  You get worse. Get help right away if:  Your chest hurts.  You have trouble breathing in enough air.  You have an uncomfortable feeling in your back, arms, or stomach.  You have trouble talking.  One side  of your body feels weak.  A part of your face is hanging down. These symptoms may be an emergency. Do not wait to see if the symptoms will go away. Get medical help right away. Call your local emergency services (911 in the [place] Do not drive yourself to the hospital. Summary  This condition affects breathing during sleep.  The most common cause is a collapsed or blocked airway.  The goal of treatment is to help you breathe normally while you sleep. This information is not intended to replace advice given to you by your health care provider. Make sure you discuss any questions you have with your health care provider. Document Revised: 02/19/2018 Document Reviewed: 12/29/2017 Elsevier Patient Education  [place].

## 2019-11-14 NOTE — Progress Notes (Signed)
Reviewed and agree with assessment/plan.   Marja Adderley, MD [facility] 11/14/2019, 7:07 AM Pager:  [phone]  

## 2019-12-06 ENCOUNTER — Other Ambulatory Visit: Payer: Self-pay

## 2019-12-06 ENCOUNTER — Ambulatory Visit: Payer: Medicare HMO

## 2019-12-06 DIAGNOSIS — Z9189 Other specified personal risk factors, not elsewhere classified: Secondary | ICD-10-CM

## 2019-12-06 DIAGNOSIS — G4733 Obstructive sleep apnea (adult) (pediatric): Secondary | ICD-10-CM | POA: Diagnosis not present

## 2019-12-07 DIAGNOSIS — G4733 Obstructive sleep apnea (adult) (pediatric): Secondary | ICD-10-CM | POA: Diagnosis not present

## 2019-12-08 ENCOUNTER — Telehealth: Payer: Self-pay | Admitting: Pulmonary Disease

## 2019-12-08 NOTE — Telephone Encounter (Signed)
-----   Message from Chesley Mires, MD sent at 12/07/2019  2:59 PM EDT ----- Home sleep study from 12/06/19 showed moderate obstructive sleep apnea with an AHI of 17.2 and SpO2 low of 86%.  Thanks.  Thomas Cross

## 2019-12-08 NOTE — Telephone Encounter (Signed)
Called and spoke to pt and relayed below message/recommendations.  Pt would like to discuss results with Aaron Edelman further before starting therapy. Pt has been scheduled for phone visit on 12/15/2019.  Nothing further is needed.  Will route to [place] as an [place].

## 2019-12-08 NOTE — Telephone Encounter (Signed)
12/08/2019  Please see the message below from Dr. Michela Pitcher.  Patient with moderate obstructive sleep apnea on home sleep study.  Would recommend getting the patient scheduled for a virtual visit with an APP to further review.  I recommendations today would be the patient start CPAP therapy.  If the patient is agreeable to going ahead and starting CPAP therapy okay to go ahead and place order:  New CPAP start DME of choice Mask of choice Supplies APAP settings 5-15  Patient will need to have a 28-month follow-up to show 30 days compliance of using CPAP therapy  Wyn Quaker, FNP

## 2019-12-08 NOTE — Telephone Encounter (Signed)
Sounds great. Thank you.  Aaron Edelman

## 2019-12-15 ENCOUNTER — Ambulatory Visit (INDEPENDENT_AMBULATORY_CARE_PROVIDER_SITE_OTHER): Payer: Medicare HMO | Admitting: Pulmonary Disease

## 2019-12-15 ENCOUNTER — Other Ambulatory Visit: Payer: Self-pay

## 2019-12-15 ENCOUNTER — Telehealth: Payer: Self-pay

## 2019-12-15 ENCOUNTER — Encounter: Payer: Self-pay | Admitting: Pulmonary Disease

## 2019-12-15 DIAGNOSIS — G4733 Obstructive sleep apnea (adult) (pediatric): Secondary | ICD-10-CM

## 2019-12-15 DIAGNOSIS — F17200 Nicotine dependence, unspecified, uncomplicated: Secondary | ICD-10-CM

## 2019-12-15 DIAGNOSIS — J449 Chronic obstructive pulmonary disease, unspecified: Secondary | ICD-10-CM

## 2019-12-15 DIAGNOSIS — R69 Illness, unspecified: Secondary | ICD-10-CM | POA: Diagnosis not present

## 2019-12-15 NOTE — Assessment & Plan Note (Signed)
Plan: Recommend that you stop smoking

## 2019-12-15 NOTE — Telephone Encounter (Signed)
Pt is aware of date/time of covid test prior to PFT.  

## 2019-12-15 NOTE — Assessment & Plan Note (Signed)
Current smoker 67-pack-year smoking history No formal PFTs  Plan: We will order pulmonary function testing to further evaluate

## 2019-12-15 NOTE — Progress Notes (Signed)
Virtual Visit via Telephone Note  I connected with@ on 12/15/19 at  3:00 PM EDT by telephone and verified that I am speaking with the correct person using two identifiers.  Location: Patient: Home Provider: [facility] - [address], [place], [place], [place] [address]   I discussed the limitations, risks, security and privacy concerns of performing an evaluation and management service by telephone and the availability of in person appointments. I also discussed with the patient that there may be a patient responsible charge related to this service. The patient expressed understanding and agreed to proceed.  Patient consented to consult via telephone: Yes People present and their role in pt care: Pt     History of Present Illness:  64 year old male current everyday smoker referred to our office in June/2021 for daytime sleepiness as well as suspected obstructive sleep apnea  PMH: Hyperlipidemia Smoker/ Smoking History: Current Smoker. 1.5 ppd. 67.5 pack year smoker  Maintenance: None Pt of: Needs [place] pulmonary provider  Chief complaint: Review HST   64 year old male current everyday smoker followed in our office for COPD.  He also recently completed a home sleep study that showed moderate obstructive sleep apnea.  Patient has poor dentition.  He is agreeable to proceeding forward with CPAP therapy 1 at but wanted to discuss treatment therapies with our office.  Patient has not had formal pulmonary function testing.  We will discuss this today.  Observations/Objective:  12/06/19 - HST - AHI of 17.2 and SpO2 low of 86%.  Social History   Tobacco Use  Smoking Status Current Every Day Smoker  . Packs/day: 1.50  . Years: 45.00  . Pack years: 67.50  . Types: Cigarettes  Smokeless Tobacco Never Used   Immunization History  Administered Date(s) Administered  . Pneumococcal Polysaccharide-23 11/08/2019  . Tdap 03/18/2011   Results of the Epworth  flowsheet 11/08/2019 09/26/2019  Sitting and reading 2 3  Watching TV 3 3  Sitting, inactive in a public place (e.g. a theatre or a meeting) 1 1  As a passenger in a car for an hour without a break 1 3  Lying down to rest in the afternoon when circumstances permit 1 2  Sitting and talking to someone 0 0  Sitting quietly after a lunch without alcohol 0 3  In a car, while stopped for a few minutes in traffic 0 0  Total score 8 15     Assessment and Plan:  OSA (obstructive sleep apnea) Plan: Start new CPAP APAP setting 5-15 [employer] [facility] Mask of choice 18-month follow-up with our office in the [place]  Suspected COPD  Current smoker 67-pack-year smoking history No formal PFTs  Plan: We will order pulmonary function testing to further evaluate  Smoker Plan: Recommend that you stop smoking   Follow Up Instructions:  Return in about 2 months (around 02/15/2020), or if symptoms worsen or fail to improve, for [facility] [place] [facility] - Dr. Mortimer Fries, [facility] - Dr. Patsey Berthold.   I discussed the assessment and treatment plan with the patient. The patient was provided an opportunity to ask questions and all were answered. The patient agreed with the plan and demonstrated an understanding of the instructions.   The patient was advised to call back or seek an in-person evaluation if the symptoms worsen or if the condition fails to improve as anticipated.  I provided 16 minutes of non-face-to-face time during this encounter.   Lauraine Rinne, NP

## 2019-12-15 NOTE — Assessment & Plan Note (Signed)
Plan: Start new CPAP APAP setting 5-15 [employer] [facility] Mask of choice 75-month follow-up with our office in the [facility]

## 2019-12-15 NOTE — Patient Instructions (Addendum)
You were seen today by Lauraine Rinne, NP  for:   1. OSA (obstructive sleep apnea)  New CPAP start Mask of choice APAP setting 5-15 Supplies  We recommend that you start using your CPAP daily >>>Keep up the hard work using your device >>> Goal should be wearing this for the entire night that you are sleeping, at least 4 to 6 hours  Remember:  . Do not drive or operate heavy machinery if tired or drowsy.  . Please notify the supply company and office if you are unable to use your device regularly due to missing supplies or machine being broken.  . Work on maintaining a healthy weight and following your recommended nutrition plan  . Maintain proper daily exercise and movement  . Maintaining proper use of your device can also help improve management of other chronic illnesses such as: Blood pressure, blood sugars, and weight management.   BiPAP/ CPAP Cleaning:  >>>Clean weekly, with Dawn soap, and bottle brush.  Set up to air dry. >>> Wipe mask out daily with wet wipe or towelette   2. Smoker  - Pulmonary function test; Future  We recommend that you stop smoking.  >>>You need to set a quit date >>>If you have friends or family who smoke, let them know you are trying to quit and not to smoke around you or in your living environment  Smoking Cessation Resources:  1 800 QUIT NOW  >>> Patient to call this resource and utilize it to help support her quit smoking >>> Keep up your hard work with stopping smoking  You can also contact the [facility] >>>For smoking cessation classes call [phone]  We do not recommend using e-cigarettes as a form of stopping smoking  You can sign up for smoking cessation support texts and information:  >>>https://smokefree.gov/smokefreetxt   3. Chronic obstructive pulmonary disease, unspecified COPD type ([place])  Suspected COPD will order pulmonary function testing to further evaluate  - Pulmonary function test; Future   We  recommend today:  Orders Placed This Encounter  Procedures  . AMB REFERRAL FOR DME    Referral Priority:   Routine    Referral Type:   Durable Medical Equipment Purchase    Number of Visits Requested:   1  . Pulmonary function test    Standing Status:   Future    Standing Expiration Date:   12/14/2020    Order Specific Question:   Where should this test be performed?    Answer:   [facility]    Order Specific Question:   Full PFT: includes the following: basic spirometry, spirometry pre & post bronchodilator, diffusion capacity (DLCO), lung volumes    Answer:   Full PFT   Orders Placed This Encounter  Procedures  . AMB REFERRAL FOR DME  . Pulmonary function test   No orders of the defined types were placed in this encounter.   Follow Up:    Return in about 2 months (around 02/15/2020), or if symptoms worsen or fail to improve, for [facility], [facility] - Dr. Mortimer Fries, [facility] - Dr. Patsey Berthold.   Please do your part to reduce the spread of COVID-19:      Reduce your risk of any infection  and COVID19 by using the similar precautions used for avoiding the common cold or flu:  Marland Kitchen Wash your hands often with soap and warm water for at least 20 seconds.  If soap and water are not readily  available, use an alcohol-based hand sanitizer with at least 60% alcohol.  . If coughing or sneezing, cover your mouth and nose by coughing or sneezing into the elbow areas of your shirt or coat, into a tissue or into your sleeve (not your hands). Langley Gauss A MASK when in public  . Avoid shaking hands with others and consider head nods or verbal greetings only. . Avoid touching your eyes, nose, or mouth with unwashed hands.  . Avoid close contact with people who are sick. . Avoid places or events with large numbers of people in one location, like concerts or sporting events. . If you have some symptoms but not all symptoms, continue to monitor at home and seek  medical attention if your symptoms worsen. . If you are having a medical emergency, call 911.   [place] / e-Visit: [email or web address]         [facility]: [facility]: [phone]                   [facility]: [phone]     It is flu season:   >>> Best ways to protect herself from the flu: Receive the yearly flu vaccine, practice good hand hygiene washing with soap and also using hand sanitizer when available, eat a nutritious meals, get adequate rest, hydrate appropriately   Please contact the office if your symptoms worsen or you have concerns that you are not improving.   Thank you for choosing [facility] for your healthcare, and for allowing [place] to partner with you on your healthcare journey. I am thankful to be able to provide care to you today.   Wyn Quaker FNP-C    Living With Sleep Apnea Sleep apnea is a condition in which breathing pauses or becomes shallow during sleep. Sleep apnea is most commonly caused by a collapsed or blocked airway. People with sleep apnea snore loudly and have times when they gasp and stop breathing for 10 seconds or more during sleep. This happens over and over during the night. This disrupts your sleep and keeps your body from getting the rest that it needs, which can cause tiredness and lack of energy (fatigue) during the day. The breaks in breathing also interrupt the deep sleep that you need to feel rested. Even if you do not completely wake up from the gaps in breathing, your sleep may not be restful. You may also have a headache in the morning and low energy during the day, and you may feel anxious or depressed. How can sleep apnea affect me? Sleep apnea increases your chances of extreme tiredness during the day (daytime fatigue). It can also increase your risk for health  conditions, such as:  Heart attack.  Stroke.  Diabetes.  Heart failure.  Irregular heartbeat.  High blood pressure. If you have daytime fatigue as a result of sleep apnea, you may be more likely to:  Perform poorly at school or work.  Fall asleep while driving.  Have difficulty with attention.  Develop depression or anxiety.  Become severely overweight (obese).  Have sexual dysfunction. What actions can I take to manage sleep apnea? Sleep apnea treatment   If you were given a device to open your airway while you sleep, use it only as told by your health care provider. You may be given: ? An oral appliance. This is a custom-made mouthpiece that shifts your lower jaw  forward. ? A continuous positive airway pressure (CPAP) device. This device blows air through a mask when you breathe out (exhale). ? A nasal expiratory positive airway pressure (EPAP) device. This device has valves that you put into each nostril. ? A bi-level positive airway pressure (BPAP) device. This device blows air through a mask when you breathe in (inhale) and breathe out (exhale).  You may need surgery if other treatments do not work for you. Sleep habits  Go to sleep and wake up at the same time every day. This helps set your internal clock (circadian rhythm) for sleeping. ? If you stay up later than usual, such as on weekends, try to get up in the morning within 2 hours of your normal wake time.  Try to get at least 7-9 hours of sleep each night.  Stop computer, tablet, and mobile phone use a few hours before bedtime.  Do not take long naps during the day. If you nap, limit it to 30 minutes.  Have a relaxing bedtime routine. Reading or listening to music may relax you and help you sleep.  Use your bedroom only for sleep. ? Keep your television and computer out of your bedroom. ? Keep your bedroom cool, dark, and quiet. ? Use a supportive mattress and pillows.  Follow your health care  provider's instructions for other changes to sleep habits. Nutrition  Do not eat heavy meals in the evening.  Do not have caffeine in the later part of the day. The effects of caffeine can last for more than 5 hours.  Follow your health care provider's or dietitian's instructions for any diet changes. Lifestyle      Do not drink alcohol before bedtime. Alcohol can cause you to fall asleep at first, but then it can cause you to wake up in the middle of the night and have trouble getting back to sleep.  Do not use any products that contain nicotine or tobacco, such as cigarettes and e-cigarettes. If you need help quitting, ask your health care provider. Medicines  Take over-the-counter and prescription medicines only as told by your health care provider.  Do not use over-the-counter sleep medicine. You can become dependent on this medicine, and it can make sleep apnea worse.  Do not use medicines, such as sedatives and narcotics, unless told by your health care provider. Activity  Exercise on most days, but avoid exercising in the evening. Exercising near bedtime can interfere with sleeping.  If possible, spend time outside every day. Natural light helps regulate your circadian rhythm. General information  Lose weight if you need to, and maintain a healthy weight.  Keep all follow-up visits as told by your health care provider. This is important.  If you are having surgery, make sure to tell your health care provider that you have sleep apnea. You may need to bring your device with you. Where to find more information Learn more about sleep apnea and daytime fatigue from:  American Sleep Association: sleepassociation.[place]: sleepfoundation.org  National Heart, Lung, and Blood Institute: https://www.hartman-hill.biz/ Summary  Sleep apnea can cause daytime fatigue and other serious health conditions.  Both sleep apnea and daytime fatigue can be bad for your health  and well-being.  You may need to wear a device while sleeping to help keep your airway open.  If you are having surgery, make sure to tell your health care provider that you have sleep apnea. You may need to bring your device with you.  Making changes to sleep habits, diet, lifestyle, and activity can help you manage sleep apnea. This information is not intended to replace advice given to you by your health care provider. Make sure you discuss any questions you have with your health care provider. Document Revised: 08/27/2018 Document Reviewed: 07/30/2017 Elsevier Patient Education  [place].

## 2019-12-16 ENCOUNTER — Other Ambulatory Visit
Admission: RE | Admit: 2019-12-16 | Discharge: 2019-12-16 | Disposition: A | Discharge status: Home / Self Care | Payer: Medicare HMO | Source: Ambulatory Visit | Attending: Pulmonary Disease | Admitting: Pulmonary Disease

## 2019-12-16 ENCOUNTER — Other Ambulatory Visit: Payer: Self-pay

## 2019-12-16 DIAGNOSIS — Z20822 Contact with and (suspected) exposure to covid-19: Secondary | ICD-10-CM | POA: Diagnosis not present

## 2019-12-16 DIAGNOSIS — Z01812 Encounter for preprocedural laboratory examination: Secondary | ICD-10-CM | POA: Insufficient documentation

## 2019-12-16 LAB — SARS CORONAVIRUS 2 (TAT 6-24 HRS): SARS Coronavirus 2: NEGATIVE

## 2019-12-19 ENCOUNTER — Other Ambulatory Visit: Payer: Self-pay

## 2019-12-19 ENCOUNTER — Ambulatory Visit: Payer: Medicare HMO | Attending: Pulmonary Disease

## 2019-12-19 DIAGNOSIS — R69 Illness, unspecified: Secondary | ICD-10-CM | POA: Diagnosis not present

## 2019-12-19 DIAGNOSIS — F17210 Nicotine dependence, cigarettes, uncomplicated: Secondary | ICD-10-CM | POA: Diagnosis not present

## 2019-12-19 DIAGNOSIS — J449 Chronic obstructive pulmonary disease, unspecified: Secondary | ICD-10-CM

## 2019-12-19 LAB — PULMONARY FUNCTION TEST ARMC ONLY
DL/VA % pred: 65 %
DL/VA: 2.7 ml/min/mmHg/L
DLCO unc % pred: 68 %
DLCO unc: 18.68 ml/min/mmHg
FEF 25-75 Post: 1.42 L/sec
FEF 25-75 Pre: 1.3 L/sec
FEF2575-%Change-Post: 9 %
FEF2575-%Pred-Post: 51 %
FEF2575-%Pred-Pre: 46 %
FEV1-%Change-Post: 0 %
FEV1-%Pred-Post: 69 %
FEV1-%Pred-Pre: 69 %
FEV1-Post: 2.45 L
FEV1-Pre: 2.46 L
FEV1FVC-%Change-Post: 0 %
FEV1FVC-%Pred-Pre: 85 %
FEV6-%Change-Post: -2 %
FEV6-%Pred-Post: 82 %
FEV6-%Pred-Pre: 84 %
FEV6-Post: 3.71 L
FEV6-Pre: 3.79 L
FEV6FVC-%Change-Post: -1 %
FEV6FVC-%Pred-Post: 101 %
FEV6FVC-%Pred-Pre: 102 %
FVC-%Change-Post: 0 %
FVC-%Pred-Post: 81 %
FVC-%Pred-Pre: 81 %
FVC-Post: 3.84 L
Post FEV1/FVC ratio: 64 %
Post FEV6/FVC ratio: 97 %
Pre FEV1/FVC ratio: 63 %
Pre FEV6/FVC Ratio: 98 %
RV % pred: 138 %
RV: 3.35 L
TLC % pred: 103 %
TLC: 7.48 L

## 2019-12-19 MED ORDER — ALBUTEROL SULFATE (2.5 MG/3ML) 0.083% IN NEBU
2.5000 mg | INHALATION_SOLUTION | Freq: Once | RESPIRATORY_TRACT | Status: AC
  Administered 2019-12-19: 2.5 mg via RESPIRATORY_TRACT
  Filled 2019-12-19: qty 3

## 2020-01-03 DIAGNOSIS — Z825 Family history of asthma and other chronic lower respiratory diseases: Secondary | ICD-10-CM | POA: Diagnosis not present

## 2020-01-03 DIAGNOSIS — N529 Male erectile dysfunction, unspecified: Secondary | ICD-10-CM | POA: Diagnosis not present

## 2020-01-03 DIAGNOSIS — Z8249 Family history of ischemic heart disease and other diseases of the circulatory system: Secondary | ICD-10-CM | POA: Diagnosis not present

## 2020-01-03 DIAGNOSIS — R030 Elevated blood-pressure reading, without diagnosis of hypertension: Secondary | ICD-10-CM | POA: Diagnosis not present

## 2020-01-03 DIAGNOSIS — R69 Illness, unspecified: Secondary | ICD-10-CM | POA: Diagnosis not present

## 2020-01-18 DIAGNOSIS — G4733 Obstructive sleep apnea (adult) (pediatric): Secondary | ICD-10-CM | POA: Diagnosis not present

## 2020-01-31 ENCOUNTER — Ambulatory Visit: Payer: Medicare HMO | Admitting: Pulmonary Disease

## 2020-02-17 DIAGNOSIS — G4733 Obstructive sleep apnea (adult) (pediatric): Secondary | ICD-10-CM | POA: Diagnosis not present

## 2020-03-19 DIAGNOSIS — G4733 Obstructive sleep apnea (adult) (pediatric): Secondary | ICD-10-CM | POA: Diagnosis not present

## 2020-04-05 ENCOUNTER — Ambulatory Visit (INDEPENDENT_AMBULATORY_CARE_PROVIDER_SITE_OTHER): Payer: Medicare HMO | Admitting: Adult Health

## 2020-04-05 ENCOUNTER — Encounter: Payer: Self-pay | Admitting: Adult Health

## 2020-04-05 ENCOUNTER — Other Ambulatory Visit: Payer: Self-pay

## 2020-04-05 DIAGNOSIS — J069 Acute upper respiratory infection, unspecified: Secondary | ICD-10-CM | POA: Diagnosis not present

## 2020-04-05 DIAGNOSIS — R52 Pain, unspecified: Secondary | ICD-10-CM | POA: Insufficient documentation

## 2020-04-05 NOTE — Patient Instructions (Signed)

## 2020-04-05 NOTE — Progress Notes (Addendum)
Virtual telephone visit    Virtual Visit via Telephone Note   This visit type was conducted due to national recommendations for restrictions regarding the COVID-19 Pandemic (e.g. social distancing) in an effort to limit this patient's exposure and mitigate transmission in our community. Due to his co-morbid illnesses, this patient is at least at moderate risk for complications without adequate follow up. This format is felt to be most appropriate for this patient at this time. The patient did not have access to video technology or had technical difficulties with video requiring transitioning to audio format only (telephone). Physical exam was limited to content and character of the telephone converstion.   Parties involved in visit as below:   Patient location: at home  Provider location: Provider: Provider's office at  [facility], [place].     I discussed the limitations of evaluation and management by telemedicine and the availability of in person appointments. The patient expressed understanding and agreed to proceed.   Visit Date: 04/05/2020  Today's healthcare provider: Marcille Buffy, FNP   Chief Complaint  Patient presents with  . Cough   Subjective    Cough This is a new problem. Episode onset: 2 days ago. The problem has been gradually improving. The cough is productive of sputum (clear sputum). Associated symptoms include headaches, myalgias and a sore throat. Pertinent negatives include no chest pain, chills, ear congestion, ear pain, fever, nasal congestion, postnasal drip, rhinorrhea, shortness of breath or wheezing. Treatments tried: Advil and Alka Seltzer cold plus. The treatment provided moderate relief.    He reports he is feeling better today. He is not using inhalers.   He has had sore muscles and aching, with headache the past two days.. Muscle aches he had/ He smokes still he reports. He has his " normal cough" now. Had sore  throat yesterday.   He reports he feels well today, no symptoms. Denies any loss of taste or smell.   Denies any distress or difficulty with normal activities.   Denies any new symptoms or any new medication. Denies any strep exposure, mono or covid.   Patient  denies any fever, chills, rash, chest pain, shortness of breath, nausea, vomiting, or diarrhea.  Denies dizziness, lightheadedness, pre syncopal or syncopal episodes.     Patient Active Problem List   Diagnosis Date Noted  . Viral upper respiratory tract infection 04/05/2020  . Generalized body aches 04/05/2020  . OSA (obstructive sleep apnea) 12/15/2019  . Smoker 11/08/2019  . Suspected COPD  07/08/2017  . Family history of malignant neoplasm of prostate 06/11/2015  . Smoking greater than 30 pack years 06/11/2015  . Avitaminosis D 06/11/2015  . Hypercholesterolemia without hypertriglyceridemia 12/04/2005   Past Medical History:  Diagnosis Date  . Arthritis   . GERD (gastroesophageal reflux disease)    RARE-TUMS   Past Surgical History:  Procedure Laterality Date  . COLONOSCOPY  2009  . HERNIA REPAIR  30 years ago   right inguinal  . INGUINAL HERNIA REPAIR Right 05/20/2016   Procedure: LAPAROSCOPIC INGUINAL HERNIA;  Surgeon: Christene Lye, MD;  Location: [facility] ORS;  Service: General;  Laterality: Right;  . TONSILLECTOMY AND ADENOIDECTOMY     Social History   Tobacco Use  . Smoking status: Current Every Day Smoker    Packs/day: 1.50    Years: 45.00    Pack years: 67.50    Types: Cigarettes  . Smokeless tobacco: Never Used  Vaping Use  . Vaping Use: Never  used  Substance Use Topics  . Alcohol use: Yes    Alcohol/week: 0.0 standard drinks    Comment: MIXED DRINKS EVERY DAY  . Drug use: No   Social History   Socioeconomic History  . Marital status: Married    Spouse name: Not on file  . Number of children: Not on file  . Years of education: Not on file  . Highest education level: Not on file    Occupational History  . Not on file  Tobacco Use  . Smoking status: Current Every Day Smoker    Packs/day: 1.50    Years: 45.00    Pack years: 67.50    Types: Cigarettes  . Smokeless tobacco: Never Used  Vaping Use  . Vaping Use: Never used  Substance and Sexual Activity  . Alcohol use: Yes    Alcohol/week: 0.0 standard drinks    Comment: MIXED DRINKS EVERY DAY  . Drug use: No  . Sexual activity: Not on file  Other Topics Concern  . Not on file  Social History Narrative  . Not on file   Social Determinants of Health   Financial Resource Strain:   . Difficulty of Paying Living Expenses: Not on file  Food Insecurity:   . Worried About Charity fundraiser in the Last Year: Not on file  . Ran Out of Food in the Last Year: Not on file  Transportation Needs:   . Lack of Transportation (Medical): Not on file  . Lack of Transportation (Non-Medical): Not on file  Physical Activity:   . Days of Exercise per Week: Not on file  . Minutes of Exercise per Session: Not on file  Stress:   . Feeling of Stress : Not on file  Social Connections:   . Frequency of Communication with Friends and Family: Not on file  . Frequency of Social Gatherings with Friends and Family: Not on file  . Attends Religious Services: Not on file  . Active Member of Clubs or Organizations: Not on file  . Attends Archivist Meetings: Not on file  . Marital Status: Not on file  Intimate Partner Violence:   . Fear of Current or Ex-Partner: Not on file  . Emotionally Abused: Not on file  . Physically Abused: Not on file  . Sexually Abused: Not on file   Family Status  Relation Name Status  . Mother  Deceased  . Father  Deceased  . Brother 1 Deceased  . MGF  Deceased  . PGM  Deceased  . PGF  Deceased  . Sister  (Not Specified)  . Brother 2 (Not Specified)  . Brother 3 (Not Specified)  . Brother 4 (Not Specified)   Family History  Problem Relation Age of Onset  . Heart disease Mother    . Kidney cancer Mother   . Bladder Cancer Mother   . Arthritis Mother   . CVA Father   . Prostate cancer Father   . Dementia Father   . Hypertension Father   . Heart attack Father   . Heart attack Brother   . Hyperlipidemia Brother   . Heart disease Maternal Grandfather   . Stroke Paternal Grandmother   . Colon cancer Paternal Grandfather   . Hyperlipidemia Sister   . Hyperlipidemia Brother   . Hyperlipidemia Brother   . Hyperlipidemia Brother    No Known Allergies    Medications: Outpatient Medications Prior to Visit  Medication Sig  . aspirin EC 81 MG tablet Take 81  mg by mouth every evening.   . calcium carbonate (TUMS - DOSED IN MG ELEMENTAL CALCIUM) 500 MG chewable tablet Chew 1 tablet by mouth as needed for indigestion or heartburn.  . Cholecalciferol 1000 units capsule Take 1,000 Units by mouth every evening.   . Multiple Vitamins-Minerals (MULTIVITAMIN WITH MINERALS) tablet Take 1 tablet by mouth every evening.   . Omega-3 Fatty Acids (FISH OIL) 1000 MG CPDR Take 1 capsule by mouth every evening.   Marland Kitchen VITAMIN D PO Take by mouth.  . Vitamins-Lipotropics (LIPOFLAVONOID) TABS Take 1 tablet by mouth every evening.    No facility-administered medications prior to visit.    Review of Systems  Constitutional: Positive for fatigue. Negative for activity change, appetite change, chills, diaphoresis, fever and unexpected weight change.  HENT: Positive for sinus pressure and sore throat. Negative for congestion, ear pain, postnasal drip, rhinorrhea and sinus pain.   Respiratory: Positive for cough. Negative for apnea, choking, chest tightness, shortness of breath, wheezing and stridor.   Cardiovascular: Negative for chest pain and palpitations.  Gastrointestinal: Negative for abdominal pain, nausea and vomiting.  Genitourinary: Negative.   Musculoskeletal: Positive for myalgias. Negative for arthralgias, back pain, gait problem and joint swelling.  Neurological: Positive for  headaches. Negative for dizziness, seizures, speech difficulty, weakness and light-headedness.  Psychiatric/Behavioral: Negative.     Last CBC Lab Results  Component Value Date   WBC 6.4 09/27/2019   HGB 14.8 09/27/2019   HCT 42.3 09/27/2019   MCV 98 (H) 09/27/2019   MCH 34.4 (H) 09/27/2019   RDW 12.2 09/27/2019   PLT 224 03/50/0938   Last metabolic panel Lab Results  Component Value Date   GLUCOSE 92 09/27/2019   NA 142 09/27/2019   K 4.0 09/27/2019   CL 105 09/27/2019   CO2 20 09/27/2019   BUN 13 09/27/2019   CREATININE 1.04 09/27/2019   GFRNONAA 75 09/27/2019   GFRAA 87 09/27/2019   CALCIUM 8.9 09/27/2019   PROT 6.2 09/27/2019   ALBUMIN 4.1 09/27/2019   LABGLOB 2.1 09/27/2019   AGRATIO 2.0 09/27/2019   BILITOT 0.5 09/27/2019   ALKPHOS 130 (H) 09/27/2019   AST 17 09/27/2019   ALT 16 09/27/2019   Last lipids Lab Results  Component Value Date   CHOL 188 06/23/2019   HDL 37 (L) 06/23/2019   LDLCALC 131 (H) 06/23/2019   TRIG 112 06/23/2019   CHOLHDL 5.1 (H) 04/08/2017   Last hemoglobin A1c No results found for: HGBA1C Last thyroid functions Lab Results  Component Value Date   TSH 2.210 06/23/2019   Last vitamin D Lab Results  Component Value Date   VD25OH 24.5 (L) 06/23/2019   Last vitamin B12 and Folate No results found for: VITAMINB12, FOLATE    Objective    There were no vitals taken for this visit. BP Readings from Last 3 Encounters:  11/08/19 124/62  11/03/19 112/70  09/29/19 138/72   Wt Readings from Last 3 Encounters:  11/08/19 195 lb (88.5 kg)  11/03/19 196 lb 2 oz (89 kg)  09/29/19 197 lb (89.4 kg)        Assessment & Plan     Viral upper respiratory tract infection  Generalized body aches   He reports he is feeling much better today after two days of symptoms,  than he was yesterday when he called to make the appointment and has no symptoms at this time, he is advised he can come for Covid/ Flu testing on 04/06/2020 in  parking lot  at 430 if he would like.   He reports he will call back if he feels worse.   He denies any other concerns.     Return in about 1 week (around 04/12/2020), or if symptoms worsen or fail to improve, for at any time for any worsening symptoms, Go to Emergency room/ urgent care if worse.   Red Flags discussed. The patient was given clear instructions to go to ER or return to medical center if any red flags develop, symptoms do not improve, worsen or new problems develop. They verbalized understanding.  I discussed the assessment and treatment plan with the patient. The patient was provided an opportunity to ask questions and all were answered. The patient agreed with the plan and demonstrated an understanding of the instructions.   The patient was advised to call back or seek an in-person evaluation if the symptoms worsen or if the condition fails to improve as anticipated.  I provided 20 minutes of non-face-to-face time during this encounter.    Marcille Buffy, [place] [phone] (phone) [phone] (fax)  [place]

## 2020-04-06 ENCOUNTER — Encounter: Payer: Self-pay | Admitting: Adult Health

## 2020-04-18 DIAGNOSIS — G4733 Obstructive sleep apnea (adult) (pediatric): Secondary | ICD-10-CM | POA: Diagnosis not present

## 2020-05-19 DIAGNOSIS — G4733 Obstructive sleep apnea (adult) (pediatric): Secondary | ICD-10-CM | POA: Diagnosis not present

## 2020-05-24 ENCOUNTER — Other Ambulatory Visit: Payer: Self-pay

## 2020-05-24 ENCOUNTER — Telehealth: Payer: Self-pay

## 2020-05-24 DIAGNOSIS — Z1211 Encounter for screening for malignant neoplasm of colon: Secondary | ICD-10-CM

## 2020-05-24 NOTE — Telephone Encounter (Signed)
Gastroenterology Pre-Procedure Review  Request Date: Friday 07/27/20 Requesting Physician: Dr. Tobi Bastos  PATIENT REVIEW QUESTIONS: The patient responded to the following health history questions as indicated:    1. Are you having any GI issues? no 2. Do you have a personal history of Polyps? no 3. Do you have a family history of Colon Cancer or Polyps? no 4. Diabetes Mellitus? no 5. Joint replacements in the past 12 months?no 6. Major health problems in the past 3 months?no 7. Any artificial heart valves, MVP, or defibrillator?no    MEDICATIONS & ALLERGIES:    Patient reports the following regarding taking any anticoagulation/antiplatelet therapy:   Plavix, Coumadin, Eliquis, Xarelto, Lovenox, Pradaxa, Brilinta, or Effient? no Aspirin? no  Patient confirms/reports the following medications:  Current Outpatient Medications  Medication Sig Dispense Refill  . aspirin EC 81 MG tablet Take 81 mg by mouth every evening.     . calcium carbonate (TUMS - DOSED IN MG ELEMENTAL CALCIUM) 500 MG chewable tablet Chew 1 tablet by mouth as needed for indigestion or heartburn.    . Cholecalciferol 1000 units capsule Take 1,000 Units by mouth every evening.     . Multiple Vitamins-Minerals (MULTIVITAMIN WITH MINERALS) tablet Take 1 tablet by mouth every evening.     . Omega-3 Fatty Acids (FISH OIL) 1000 MG CPDR Take 1 capsule by mouth every evening.     Marland Kitchen VITAMIN D PO Take by mouth.    . Vitamins-Lipotropics (LIPOFLAVONOID) TABS Take 1 tablet by mouth every evening.      No current facility-administered medications for this visit.    Patient confirms/reports the following allergies:  No Known Allergies  No orders of the defined types were placed in this encounter.   AUTHORIZATION INFORMATION Primary Insurance: 1D#: Group #:  Secondary Insurance: 1D#: Group #:  SCHEDULE INFORMATION: Date: 07/27/20 Time: Location:[facility]

## 2020-06-19 DIAGNOSIS — G4733 Obstructive sleep apnea (adult) (pediatric): Secondary | ICD-10-CM | POA: Diagnosis not present

## 2020-06-21 NOTE — Progress Notes (Addendum)
Subjective:   Thomas Cross is a 65 y.o. male who presents for Medicare Annual/Subsequent preventive examination.  I connected with Silvestre Moment today by telephone and verified that I am speaking with the correct person using two identifiers. Location patient: home Location provider: work Persons participating in the virtual visit: patient, provider.   I discussed the limitations, risks, security and privacy concerns of performing an evaluation and management service by telephone and the availability of in person appointments. I also discussed with the patient that there may be a patient responsible charge related to this service. The patient expressed understanding and verbally consented to this telephonic visit.    Interactive audio and video telecommunications were attempted between this provider and patient, however failed, due to patient having technical difficulties OR patient did not have access to video capability.  We continued and completed visit with audio only.   Review of Systems    N/A  Cardiac Risk Factors include: advanced age (>50men, >35 women);sedentary lifestyle;smoking/ tobacco exposure;male gender     Objective:    There were no vitals filed for this visit. There is no height or weight on file to calculate BMI.  Advanced Directives 06/25/2020 05/09/2016  Does Patient Have a Medical Advance Directive? No No  Would patient like information on creating a medical advance directive? No - Patient declined -    Current Medications (verified) Outpatient Encounter Medications as of 06/25/2020  Medication Sig   aspirin EC 81 MG tablet Take 81 mg by mouth every evening.    calcium carbonate (TUMS - DOSED IN MG ELEMENTAL CALCIUM) 500 MG chewable tablet Chew 1 tablet by mouth as needed for indigestion or heartburn.   Cholecalciferol 1000 units capsule Take 1,000 Units by mouth every evening.    Multiple Vitamins-Minerals (MULTIVITAMIN WITH MINERALS) tablet Take 1 tablet  by mouth every evening.    Omega-3 Fatty Acids (FISH OIL) 1000 MG CPDR Take 1 capsule by mouth every evening.    Vitamins-Lipotropics (LIPOFLAVONOID) TABS Take 1 tablet by mouth every evening.    VITAMIN D PO Take by mouth. (Patient not taking: Reported on 06/25/2020)   No facility-administered encounter medications on file as of 06/25/2020.    Allergies (verified) Patient has no known allergies.   History: Past Medical History:  Diagnosis Date   Arthritis    GERD (gastroesophageal reflux disease)    RARE-TUMS   Past Surgical History:  Procedure Laterality Date   COLONOSCOPY  2009   HERNIA REPAIR  30 years ago   right inguinal   INGUINAL HERNIA REPAIR Right 05/20/2016   Procedure: LAPAROSCOPIC INGUINAL HERNIA;  Surgeon: Christene Lye, MD;  Location: [facility] ORS;  Service: General;  Laterality: Right;   TONSILLECTOMY AND ADENOIDECTOMY     Family History  Problem Relation Age of Onset   Heart disease Mother    Kidney cancer Mother    Bladder Cancer Mother    Arthritis Mother    CVA Father    Prostate cancer Father    Dementia Father    Hypertension Father    Heart attack Father    Heart attack Brother    Hyperlipidemia Brother    Heart disease Maternal Grandfather    Stroke Paternal Grandmother    Colon cancer Paternal Grandfather    Hyperlipidemia Sister    Hyperlipidemia Brother    Hyperlipidemia Brother    Hyperlipidemia Brother    Social History   Socioeconomic History   Marital status: Married    Spouse name:  Not on file   Number of children: 2   Years of education: Not on file   Highest education level: High school graduate  Occupational History   Occupation: full time / owner  Tobacco Use   Smoking status: Current Every Day Smoker    Packs/day: 1.50    Years: 45.00    Pack years: 67.50    Types: Cigarettes   Smokeless tobacco: Never Used  Vaping Use   Vaping Use: Never used  Substance and Sexual Activity   Alcohol use: Yes    Alcohol/week: 14.0  standard drinks    Types: 14 Shots of liquor per week    Comment: 2 MIXED DRINKS EVERY DAY   Drug use: No   Sexual activity: Not on file  Other Topics Concern   Not on file  Social History Narrative   Not on file   Social Determinants of Health   Financial Resource Strain: Low Risk    Difficulty of Paying Living Expenses: Not hard at all  Food Insecurity: No Food Insecurity   Worried About Charity fundraiser in the Last Year: Never true   Ran Out of Food in the Last Year: Never true  Transportation Needs: No Transportation Needs   Lack of Transportation (Medical): No   Lack of Transportation (Non-Medical): No  Physical Activity: Inactive   Days of Exercise per Week: 0 days   Minutes of Exercise per Session: 0 min  Stress: Stress Concern Present   Feeling of Stress : To some extent  Social Connections: Moderately Integrated   Frequency of Communication with Friends and Family: More than three times a week   Frequency of Social Gatherings with Friends and Family: More than three times a week   Attends Religious Services: Never   Marine scientist or Organizations: Yes   Attends Music therapist: More than 4 times per year   Marital Status: Married    Tobacco Counseling Ready to quit: Yes Counseling given: No   Clinical Intake:  Pre-visit preparation completed: Yes  Pain : No/denies pain     Nutritional Risks: None Diabetes: No  How often do you need to have someone help you when you read instructions, pamphlets, or other written materials from your doctor or pharmacy?: 1 - Never  Diabetic? No  Interpreter Needed?: No  Information entered by :: [facility], LPN   Activities of Daily Living In your present state of health, do you have any difficulty performing the following activities: 06/25/2020  Hearing? N  Vision? N  Difficulty concentrating or making decisions? N  Walking or climbing stairs? Y  Comment Due to knee pains.  Dressing or  bathing? N  Doing errands, shopping? N  Preparing Food and eating ? N  Using the Toilet? N  In the past six months, have you accidently leaked urine? N  Do you have problems with loss of bowel control? N  Managing your Medications? N  Managing your Finances? N  Housekeeping or managing your Housekeeping? N  Some recent data might be hidden    Patient Care Team: Chrismon, Vickki Muff, PA-C as PCP - General (Family Medicine) Kate Sable, MD as PCP - Cardiology (Cardiology) Thelma Comp, OD (Optometry)  Indicate any recent Medical Services you may have received from other than [facility] providers in the past year (date may be approximate).     Assessment:   This is a routine wellness examination for Malek.  Hearing/Vision screen No exam data present  Dietary  issues and exercise activities discussed: Current Exercise Habits: The patient has a physically strenuous job, but has no regular exercise apart from work., Exercise limited by: None identified  Goals   None    Depression Screen PHQ 2/9 Scores 06/25/2020 06/23/2019 03/22/2018 06/30/2016 03/11/2016  PHQ - 2 Score 0 0 0 0 0    Fall Risk Fall Risk  06/25/2020 09/26/2019 06/23/2019 03/22/2018 03/11/2016  Falls in the past year? 0 0 0 0 No  Number falls in past yr: 0 0 - - -  Injury with Fall? 0 0 - - -  Follow up - Falls evaluation completed - - -    FALL RISK PREVENTION PERTAINING TO THE HOME:  Any stairs in or around the home? Yes  If so, are there any without handrails? No  Home free of loose throw rugs in walkways, pet beds, electrical cords, etc? Yes  Adequate lighting in your home to reduce risk of falls? Yes   ASSISTIVE DEVICES UTILIZED TO PREVENT FALLS:  Life alert? No  Use of a cane, walker or w/c? No  Grab bars in the bathroom? No  Shower chair or bench in shower? Yes  Elevated toilet seat or a handicapped toilet? Yes    Cognitive Function: Normal cognitive status assessed by observation by this Nurse  Health Advisor. No abnormalities found.       6CIT Screen 03/11/2016  What Year? 0 points  What month? 0 points  What time? 0 points  Count back from 20 0 points  Months in reverse 2 points  Repeat phrase 2 points  Total Score 4    Immunizations Immunization History  Administered Date(s) Administered   Pneumococcal Polysaccharide-23 11/08/2019   Tdap 03/18/2011    TDAP status: Up to date  Flu Vaccine status: Declined, Education has been provided regarding the importance of this vaccine but patient still declined. Advised may receive this vaccine at local pharmacy or Health Dept. Aware to provide a copy of the vaccination record if obtained from local pharmacy or Health Dept. Verbalized acceptance and understanding.  Pneumococcal vaccine status: Up to date  Covid-19 vaccine status: Completed vaccines  Qualifies for Shingles Vaccine? Yes   Zostavax completed No   Shingrix Completed?: No.    Education has been provided regarding the importance of this vaccine. Patient has been advised to call insurance company to determine out of pocket expense if they have not yet received this vaccine. Advised may also receive vaccine at local pharmacy or Health Dept. Verbalized acceptance and understanding.  Screening Tests Health Maintenance  Topic Date Due   COVID-19 Vaccine (1) Never done   COLONOSCOPY (Pts 45-30yrs Insurance coverage will need to be confirmed)  11/01/2017   INFLUENZA VACCINE  08/16/2020 (Originally 12/18/2019)   PNA vac Low Risk Adult (2 of 2 - PCV13) 11/07/2020   TETANUS/TDAP  03/17/2021   Hepatitis C Screening  Completed    Health Maintenance  Health Maintenance Due  Topic Date Due   COVID-19 Vaccine (1) Never done   COLONOSCOPY (Pts 45-60yrs Insurance coverage will need to be confirmed)  11/01/2017    Colorectal cancer screening: Currently due, colonoscopy scheduled for 07/2020.  Lung Cancer Screening: (Low Dose CT Chest recommended if Age 35-80 years, 30  pack-year currently smoking OR have quit w/in 15years.) does qualify however completed 08/09/19. Repeat yearly.   Additional Screening:  Hepatitis C Screening: Up to date  Vision Screening: Recommended annual ophthalmology exams for early detection of glaucoma and other disorders of the  eye. Is the patient up to date with their annual eye exam?  Yes  Who is the provider or what is the name of the office in which the patient attends annual eye exams? Dr Rick Duff  If pt is not established with a provider, would they like to be referred to a provider to establish care? No .   Dental Screening: Recommended annual dental exams for proper oral hygiene  Community Resource Referral / Chronic Care Management: CRR required this visit?  No   CCM required this visit?  No      Plan:     I have personally reviewed and noted the following in the patient's chart:   Medical and social history Use of alcohol, tobacco or illicit drugs  Current medications and supplements Functional ability and status Nutritional status Physical activity Advanced directives List of other physicians Hospitalizations, surgeries, and ER visits in previous 12 months Vitals Screenings to include cognitive, depression, and falls Referrals and appointments  In addition, I have reviewed and discussed with patient certain preventive protocols, quality metrics, and best practice recommendations. A written personalized care plan for preventive services as well as general preventive health recommendations were provided to patient.     Siarah Deleo [place], [place]   07/23/1060   Nurse Notes: Colonoscopy scheduled for 07/2020. Pt plans to receive a Covid booster when able to.   Reviewed screening note and recommendations of Nurse Health Advisor. Agree with documentation and plan.

## 2020-06-25 ENCOUNTER — Ambulatory Visit (INDEPENDENT_AMBULATORY_CARE_PROVIDER_SITE_OTHER): Payer: Medicare HMO

## 2020-06-25 ENCOUNTER — Other Ambulatory Visit: Payer: Self-pay

## 2020-06-25 DIAGNOSIS — Z0000 Encounter for general adult medical examination without abnormal findings: Secondary | ICD-10-CM | POA: Diagnosis not present

## 2020-06-25 NOTE — Patient Instructions (Signed)
[name] , Thank you for taking time to come for your Medicare Wellness Visit. I appreciate your ongoing commitment to your health goals. Please review the following plan we discussed and let me know if I can assist you in the future.   Screening recommendations/referrals: Colonoscopy: Currently due, scheduled for 07/2020 Recommended yearly ophthalmology/optometry visit for glaucoma screening and checkup Recommended yearly dental visit for hygiene and checkup  Vaccinations: Influenza vaccine: Currently due, declined receiving.  Pneumococcal vaccine: Up to date, Prevnar 13 due 11/07/20 Tdap vaccine: Up to date, due 02/2021 Shingles vaccine: Shingrix discussed. Please contact your pharmacy for coverage information.     Advanced directives: Advance directive discussed with you today. Even though you declined this today please call our office should you change your mind and we can give you the proper paperwork for you to fill out.  Conditions/risks identified: Smoking cessation discussed today.   Next appointment: 08/23/20 @ 10:40 AM with [name].   Preventive Care 47 Years and Older, Male Preventive care refers to lifestyle choices and visits with your health care provider that can promote health and wellness. What does preventive care include?  A yearly physical exam. This is also called an annual well check.  Dental exams once or twice a year.  Routine eye exams. Ask your health care provider how often you should have your eyes checked.  Personal lifestyle choices, including:  Daily care of your teeth and gums.  Regular physical activity.  Eating a healthy diet.  Avoiding tobacco and drug use.  Limiting alcohol use.  Practicing safe sex.  Taking low doses of aspirin every day.  Taking vitamin and mineral supplements as recommended by your health care provider. What happens during an annual well check? The services and screenings done by your health care provider  during your annual well check will depend on your age, overall health, lifestyle risk factors, and family history of disease. Counseling  Your health care provider may ask you questions about your:  Alcohol use.  Tobacco use.  Drug use.  Emotional well-being.  Home and relationship well-being.  Sexual activity.  Eating habits.  History of falls.  Memory and ability to understand (cognition).  Work and work Statistician. Screening  You may have the following tests or measurements:  Height, weight, and BMI.  Blood pressure.  Lipid and cholesterol levels. These may be checked every 5 years, or more frequently if you are over 93 years old.  Skin check.  Lung cancer screening. You may have this screening every year starting at age 43 if you have a 30-pack-year history of smoking and currently smoke or have quit within the past 15 years.  Fecal occult blood test (FOBT) of the stool. You may have this test every year starting at age 71.  Flexible sigmoidoscopy or colonoscopy. You may have a sigmoidoscopy every 5 years or a colonoscopy every 10 years starting at age 106.  Prostate cancer screening. Recommendations will vary depending on your family history and other risks.  Hepatitis C blood test.  Hepatitis B blood test.  Sexually transmitted disease (STD) testing.  Diabetes screening. This is done by checking your blood sugar (glucose) after you have not eaten for a while (fasting). You may have this done every 1-3 years.  Abdominal aortic aneurysm (AAA) screening. You may need this if you are a current or former smoker.  Osteoporosis. You may be screened starting at age 58 if you are at high risk. Talk with your health care  provider about your test results, treatment options, and if necessary, the need for more tests. Vaccines  Your health care provider may recommend certain vaccines, such as:  Influenza vaccine. This is recommended every year.  Tetanus,  diphtheria, and acellular pertussis (Tdap, Td) vaccine. You may need a Td booster every 10 years.  Zoster vaccine. You may need this after age 22.  Pneumococcal 13-valent conjugate (PCV13) vaccine. One dose is recommended after age 18.  Pneumococcal polysaccharide (PPSV23) vaccine. One dose is recommended after age 72. Talk to your health care provider about which screenings and vaccines you need and how often you need them. This information is not intended to replace advice given to you by your health care provider. Make sure you discuss any questions you have with your health care provider. Document Released: 06/01/2015 Document Revised: 01/23/2016 Document Reviewed: 03/06/2015 Elsevier Interactive Patient Education  2017 [place] Prevention in the Home Falls can cause injuries. They can happen to people of all ages. There are many things you can do to make your home safe and to help prevent falls. What can I do on the outside of my home?  Regularly fix the edges of walkways and driveways and fix any cracks.  Remove anything that might make you trip as you walk through a door, such as a raised step or threshold.  Trim any bushes or trees on the path to your home.  Use bright outdoor lighting.  Clear any walking paths of anything that might make someone trip, such as rocks or tools.  Regularly check to see if handrails are loose or broken. Make sure that both sides of any steps have handrails.  Any raised decks and porches should have guardrails on the edges.  Have any leaves, snow, or ice cleared regularly.  Use sand or salt on walking paths during winter.  Clean up any spills in your garage right away. This includes oil or grease spills. What can I do in the bathroom?  Use night lights.  Install grab bars by the toilet and in the tub and shower. Do not use towel bars as grab bars.  Use non-skid mats or decals in the tub or shower.  If you need to sit down in  the shower, use a plastic, non-slip stool.  Keep the floor dry. Clean up any water that spills on the floor as soon as it happens.  Remove soap buildup in the tub or shower regularly.  Attach bath mats securely with double-sided non-slip rug tape.  Do not have throw rugs and other things on the floor that can make you trip. What can I do in the bedroom?  Use night lights.  Make sure that you have a light by your bed that is easy to reach.  Do not use any sheets or blankets that are too big for your bed. They should not hang down onto the floor.  Have a firm chair that has side arms. You can use this for support while you get dressed.  Do not have throw rugs and other things on the floor that can make you trip. What can I do in the kitchen?  Clean up any spills right away.  Avoid walking on wet floors.  Keep items that you use a lot in easy-to-reach places.  If you need to reach something above you, use a strong step stool that has a grab bar.  Keep electrical cords out of the way.  Do not use floor polish  or wax that makes floors slippery. If you must use wax, use non-skid floor wax.  Do not have throw rugs and other things on the floor that can make you trip. What can I do with my stairs?  Do not leave any items on the stairs.  Make sure that there are handrails on both sides of the stairs and use them. Fix handrails that are broken or loose. Make sure that handrails are as long as the stairways.  Check any carpeting to make sure that it is firmly attached to the stairs. Fix any carpet that is loose or worn.  Avoid having throw rugs at the top or bottom of the stairs. If you do have throw rugs, attach them to the floor with carpet tape.  Make sure that you have a light switch at the top of the stairs and the bottom of the stairs. If you do not have them, ask someone to add them for you. What else can I do to help prevent falls?  Wear shoes that:  Do not have high  heels.  Have rubber bottoms.  Are comfortable and fit you well.  Are closed at the toe. Do not wear sandals.  If you use a stepladder:  Make sure that it is fully opened. Do not climb a closed stepladder.  Make sure that both sides of the stepladder are locked into place.  Ask someone to hold it for you, if possible.  Clearly mark and make sure that you can see:  Any grab bars or handrails.  First and last steps.  Where the edge of each step is.  Use tools that help you move around (mobility aids) if they are needed. These include:  Canes.  Walkers.  Scooters.  Crutches.  Turn on the lights when you go into a dark area. Replace any light bulbs as soon as they burn out.  Set up your furniture so you have a clear path. Avoid moving your furniture around.  If any of your floors are uneven, fix them.  If there are any pets around you, be aware of where they are.  Review your medicines with your doctor. Some medicines can make you feel dizzy. This can increase your chance of falling. Ask your doctor what other things that you can do to help prevent falls. This information is not intended to replace advice given to you by your health care provider. Make sure you discuss any questions you have with your health care provider. Document Released: 03/01/2009 Document Revised: 10/11/2015 Document Reviewed: 06/09/2014 Elsevier Interactive Patient Education  2017 [employer].

## 2020-07-05 DIAGNOSIS — H5203 Hypermetropia, bilateral: Secondary | ICD-10-CM | POA: Diagnosis not present

## 2020-07-05 DIAGNOSIS — H524 Presbyopia: Secondary | ICD-10-CM | POA: Diagnosis not present

## 2020-07-05 DIAGNOSIS — H52223 Regular astigmatism, bilateral: Secondary | ICD-10-CM | POA: Diagnosis not present

## 2020-07-05 DIAGNOSIS — H47323 Drusen of optic disc, bilateral: Secondary | ICD-10-CM | POA: Diagnosis not present

## 2020-07-17 DIAGNOSIS — G4733 Obstructive sleep apnea (adult) (pediatric): Secondary | ICD-10-CM | POA: Diagnosis not present

## 2020-07-25 ENCOUNTER — Other Ambulatory Visit
Admission: RE | Admit: 2020-07-25 | Discharge: 2020-07-25 | Disposition: A | Discharge status: Home / Self Care | Payer: Medicare HMO | Source: Ambulatory Visit | Attending: Gastroenterology | Admitting: Gastroenterology

## 2020-07-25 ENCOUNTER — Telehealth: Payer: Self-pay

## 2020-07-25 ENCOUNTER — Other Ambulatory Visit: Payer: Self-pay

## 2020-07-25 DIAGNOSIS — Z01812 Encounter for preprocedural laboratory examination: Secondary | ICD-10-CM | POA: Insufficient documentation

## 2020-07-25 DIAGNOSIS — Z20822 Contact with and (suspected) exposure to covid-19: Secondary | ICD-10-CM | POA: Insufficient documentation

## 2020-07-25 LAB — SARS CORONAVIRUS 2 (TAT 6-24 HRS): SARS Coronavirus 2: NEGATIVE

## 2020-07-26 ENCOUNTER — Encounter: Payer: Self-pay | Admitting: *Deleted

## 2020-07-26 ENCOUNTER — Other Ambulatory Visit: Payer: Self-pay | Admitting: *Deleted

## 2020-07-26 ENCOUNTER — Encounter: Payer: Self-pay | Admitting: Gastroenterology

## 2020-07-26 DIAGNOSIS — Z122 Encounter for screening for malignant neoplasm of respiratory organs: Secondary | ICD-10-CM

## 2020-07-26 DIAGNOSIS — Z87891 Personal history of nicotine dependence: Secondary | ICD-10-CM

## 2020-07-26 DIAGNOSIS — F17200 Nicotine dependence, unspecified, uncomplicated: Secondary | ICD-10-CM

## 2020-07-26 NOTE — Progress Notes (Signed)
Contacted and scheduled for annual lung screening scan. Patient is a current smoker with a 70.5 pack year history.

## 2020-07-27 ENCOUNTER — Encounter
Admission: RE | Disposition: A | Discharge status: Home / Self Care | Payer: Self-pay | Source: Home / Self Care | Attending: Gastroenterology

## 2020-07-27 ENCOUNTER — Ambulatory Visit: Payer: Medicare HMO | Admitting: Certified Registered Nurse Anesthetist

## 2020-07-27 ENCOUNTER — Ambulatory Visit
Admission: RE | Admit: 2020-07-27 | Discharge: 2020-07-27 | Disposition: A | Discharge status: Home / Self Care | Payer: Medicare HMO | Attending: Gastroenterology | Admitting: Gastroenterology

## 2020-07-27 ENCOUNTER — Encounter: Payer: Self-pay | Admitting: Gastroenterology

## 2020-07-27 ENCOUNTER — Other Ambulatory Visit: Payer: Self-pay

## 2020-07-27 DIAGNOSIS — K635 Polyp of colon: Secondary | ICD-10-CM

## 2020-07-27 DIAGNOSIS — Z1211 Encounter for screening for malignant neoplasm of colon: Secondary | ICD-10-CM | POA: Insufficient documentation

## 2020-07-27 DIAGNOSIS — Z7982 Long term (current) use of aspirin: Secondary | ICD-10-CM | POA: Diagnosis not present

## 2020-07-27 DIAGNOSIS — R69 Illness, unspecified: Secondary | ICD-10-CM | POA: Diagnosis not present

## 2020-07-27 DIAGNOSIS — F17210 Nicotine dependence, cigarettes, uncomplicated: Secondary | ICD-10-CM | POA: Diagnosis not present

## 2020-07-27 HISTORY — PX: COLONOSCOPY WITH PROPOFOL: SHX5780

## 2020-07-27 HISTORY — DX: Sleep apnea, unspecified: G47.30

## 2020-07-27 SURGERY — COLONOSCOPY WITH PROPOFOL
Anesthesia: General

## 2020-07-27 MED ORDER — PROPOFOL 10 MG/ML IV BOLUS
INTRAVENOUS | Status: DC | PRN
  Administered 2020-07-27: 80 mg via INTRAVENOUS

## 2020-07-27 MED ORDER — PROPOFOL 500 MG/50ML IV EMUL
INTRAVENOUS | Status: AC
  Filled 2020-07-27: qty 100

## 2020-07-27 MED ORDER — SODIUM CHLORIDE 0.9 % IV SOLN: INTRAVENOUS | Status: DC

## 2020-07-27 MED ORDER — PROPOFOL 500 MG/50ML IV EMUL
INTRAVENOUS | Status: DC | PRN
  Administered 2020-07-27: 170 ug/kg/min via INTRAVENOUS

## 2020-07-27 NOTE — Transfer of Care (Signed)
Immediate Anesthesia Transfer of Care Note  Patient: Thomas Cross  Procedure(s) Performed: COLONOSCOPY WITH PROPOFOL (N/A )  Patient Location: PACU  Anesthesia Type:General  Level of Consciousness: awake and alert   Airway & Oxygen Therapy: Patient Spontanous Breathing  Post-op Assessment: Report given to RN and Post -op Vital signs reviewed and stable  Post vital signs: Reviewed and stable  Last Vitals:  Vitals Value Taken Time  BP 98/53 07/27/20 0832  Temp 36.8 C 07/27/20 0832  Pulse 58 07/27/20 0833  Resp 21 07/27/20 0833  SpO2 99 % 07/27/20 0833  Vitals shown include unvalidated device data.  Last Pain:  Vitals:   07/27/20 0832  TempSrc: Temporal  PainSc: Asleep         Complications: No complications documented.

## 2020-07-27 NOTE — Op Note (Signed)
[facility] Gastroenterology Patient Name: Thomas Cross Procedure Date: 07/27/2020 8:02 AM MRN: [id number] Account #: [id number] Date of Birth: 1955-07-11 Admit Type: Outpatient Age: 65 Room: [facility] ENDO ROOM 2 Gender: Male Note Status: Finalized Procedure:             Colonoscopy Indications:           Screening for colorectal malignant neoplasm Providers:             Jonathon Bellows MD, MD Referring MD:          Vickki Muff. Chrismon, MD (Referring MD) Medicines:             Monitored Anesthesia Care Complications:         No immediate complications. Procedure:             Pre-Anesthesia Assessment:                        - Prior to the procedure, a History and Physical was                         performed, and patient medications, allergies and                         sensitivities were reviewed. The patient's tolerance                         of previous anesthesia was reviewed.                        - The risks and benefits of the procedure and the                         sedation options and risks were discussed with the                         patient. All questions were answered and informed                         consent was obtained.                        - ASA Grade Assessment: II - A patient with mild                         systemic disease.                        After obtaining informed consent, the colonoscope was                         passed under direct vision. Throughout the procedure,                         the patient's blood pressure, pulse, and oxygen                         saturations were monitored continuously. The                         Colonoscope was introduced through the anus  and                         advanced to the the cecum, identified by the                         appendiceal orifice. The colonoscopy was performed                         with ease. The patient tolerated the procedure well.                         The quality  of the bowel preparation was excellent. Findings:      The perianal and digital rectal examinations were normal.      Two sessile polyps were found in the sigmoid colon and descending colon.       The polyps were 4 to 5 mm in size. These polyps were removed with a cold       snare. Resection and retrieval were complete.      The exam was otherwise without abnormality on direct and retroflexion       views. Impression:            - Two 4 to 5 mm polyps in the sigmoid colon and in the                         descending colon, removed with a cold snare. Resected                         and retrieved.                        - The examination was otherwise normal on direct and                         retroflexion views. Recommendation:        - Discharge patient to home (with escort).                        - Resume previous diet.                        - Continue present medications.                        - Await pathology results.                        - Repeat colonoscopy for surveillance based on                         pathology results. Procedure Code(s):     --- Professional ---                        [phone], Colonoscopy, flexible; with removal of                         tumor(s), polyp(s), or other lesion(s) by snare  technique Diagnosis Code(s):     --- Professional ---                        Z12.11, Encounter for screening for malignant neoplasm                         of colon                        K63.5, Polyp of colon CPT copyright 2019 American Medical Association. All rights reserved. The codes documented in this report are preliminary and upon coder review may  be revised to meet current compliance requirements. Jonathon Bellows, MD Jonathon Bellows MD, MD 07/27/2020 8:30:07 AM This report has been signed electronically. Number of Addenda: 0 Note Initiated On: 07/27/2020 8:02 AM Scope Withdrawal Time: 0 hours 13 minutes 52 seconds  Total Procedure Duration:  0 hours 16 minutes 14 seconds  Estimated Blood Loss:  Estimated blood loss: none.      [facility]

## 2020-07-27 NOTE — H&P (Signed)
Jonathon Bellows, MD [address] [place], [place], [place], [address] [address], [place], [place], [place], [address] Phone: [phone]  Fax: [phone]  Primary Care Physician:  Margo Common, PA-C   Pre-Procedure History & Physical: HPI:  Thomas Cross is a 65 y.o. male is here for an colonoscopy.   Past Medical History:  Diagnosis Date  . Arthritis   . GERD (gastroesophageal reflux disease)    RARE-TUMS  . Sleep apnea     Past Surgical History:  Procedure Laterality Date  . COLONOSCOPY  2009  . HERNIA REPAIR  30 years ago   right inguinal  . INGUINAL HERNIA REPAIR Right 05/20/2016   Procedure: LAPAROSCOPIC INGUINAL HERNIA;  Surgeon: Christene Lye, MD;  Location: [facility] ORS;  Service: General;  Laterality: Right;  . TONSILLECTOMY AND ADENOIDECTOMY      Prior to Admission medications   Medication Sig Start Date End Date Taking? Authorizing Provider  aspirin EC 81 MG tablet Take 81 mg by mouth every evening.     [provider]  calcium carbonate (TUMS - DOSED IN MG ELEMENTAL CALCIUM) 500 MG chewable tablet Chew 1 tablet by mouth as needed for indigestion or heartburn.    [provider]  Cholecalciferol 1000 units capsule Take 1,000 Units by mouth every evening.     [provider]  Multiple Vitamins-Minerals (MULTIVITAMIN WITH MINERALS) tablet Take 1 tablet by mouth every evening.     [provider]  Omega-3 Fatty Acids (FISH OIL) 1000 MG CPDR Take 1 capsule by mouth every evening.     [provider]  VITAMIN D PO Take by mouth. Patient not taking: Reported on 06/25/2020    [provider]  Vitamins-Lipotropics (LIPOFLAVONOID) TABS Take 1 tablet by mouth every evening.     [provider]    Allergies as of 05/24/2020  . (No Known Allergies)    Family History  Problem Relation Age of Onset  . Heart disease Mother   . Kidney cancer Mother   . Bladder Cancer Mother   . Arthritis  Mother   . CVA Father   . Prostate cancer Father   . Dementia Father   . Hypertension Father   . Heart attack Father   . Heart attack Brother   . Hyperlipidemia Brother   . Heart disease Maternal Grandfather   . Stroke Paternal Grandmother   . Colon cancer Paternal Grandfather   . Hyperlipidemia Sister   . Hyperlipidemia Brother   . Hyperlipidemia Brother   . Hyperlipidemia Brother     Social History   Socioeconomic History  . Marital status: Married    Spouse name: Not on file  . Number of children: 2  . Years of education: Not on file  . Highest education level: High school graduate  Occupational History  . Occupation: full time / owner  Tobacco Use  . Smoking status: Current Every Day Smoker    Packs/day: 1.50    Years: 45.00    Pack years: 67.50    Types: Cigarettes  . Smokeless tobacco: Never Used  Vaping Use  . Vaping Use: Never used  Substance and Sexual Activity  . Alcohol use: Yes    Alcohol/week: 14.0 standard drinks    Types: 14 Shots of liquor per week    Comment: 2 MIXED DRINKS EVERY DAY  . Drug use: No  . Sexual activity: Not on file  Other Topics Concern  . Not on file  Social History Narrative  . Not on file   Social Determinants of Health   Financial Resource Strain: Low Risk   . Difficulty of Paying Living Expenses: Not hard at all  Food Insecurity: No Food Insecurity  . Worried About Charity fundraiser in the Last Year: Never true  . Ran Out of Food in the Last Year: Never true  Transportation Needs: No Transportation Needs  . Lack of Transportation (Medical): No  . Lack of Transportation (Non-Medical): No  Physical Activity: Inactive  . Days of Exercise per Week: 0 days  . Minutes of Exercise per Session: 0 min  Stress: Stress Concern Present  . Feeling of Stress : To some extent  Social Connections: Moderately Integrated  . Frequency of Communication with Friends and Family: More than three times a week  . Frequency of Social  Gatherings with Friends and Family: More than three times a week  . Attends Religious Services: Never  . Active Member of Clubs or Organizations: Yes  . Attends Archivist Meetings: More than 4 times per year  . Marital Status: Married  Human resources officer Violence: Not At Risk  . Fear of Current or Ex-Partner: No  . Emotionally Abused: No  . Physically Abused: No  . Sexually Abused: No    Review of Systems: See HPI, otherwise negative ROS  Physical Exam: BP (!) 142/73   Pulse 68   Temp 98 F (36.7 C) (Temporal)   Resp 20   Ht 6' (1.829 m)   Wt 90.7 kg   SpO2 99%   BMI 27.12 kg/m  General:   Alert,  pleasant and cooperative in NAD Head:  Normocephalic and atraumatic. Neck:  Supple; no masses or thyromegaly. Lungs:  Clear throughout to auscultation, normal respiratory effort.    Heart:  +S1, +S2, Regular rate and rhythm, No edema. Abdomen:  Soft, nontender and nondistended. Normal bowel sounds, without guarding, and without rebound.   Neurologic:  Alert and  oriented x4;  grossly normal neurologically.  Impression/Plan: Thomas Cross is here for an colonoscopy to be performed for Screening colonoscopy average risk   Risks, benefits, limitations, and alternatives regarding  colonoscopy have been reviewed with the patient.  Questions have been answered.  All parties agreeable.   Jonathon Bellows, MD  07/27/2020, 8:01 AM

## 2020-07-27 NOTE — Anesthesia Preprocedure Evaluation (Signed)
Anesthesia Evaluation  Patient identified by MRN, date of birth, ID band Patient awake    Reviewed: Allergy & Precautions, NPO status , Patient's Chart, lab work & pertinent test results  History of Anesthesia Complications Negative for: history of anesthetic complications  Airway Mallampati: II  TM Distance: >3 FB Neck ROM: Full    Dental  (+) Edentulous Upper, Edentulous Lower   Pulmonary sleep apnea , neg COPD, Current Smoker and Patient abstained from smoking.,    breath sounds clear to auscultation- rhonchi (-) wheezing      Cardiovascular Exercise Tolerance: Good (-) hypertension(-) CAD and (-) Past MI negative cardio ROS   Rhythm:Regular Rate:Normal - Systolic murmurs and - Diastolic murmurs    Neuro/Psych negative neurological ROS  negative psych ROS   GI/Hepatic Neg liver ROS, GERD  ,  Endo/Other  negative endocrine ROSneg diabetes  Renal/GU negative Renal ROS  negative genitourinary   Musculoskeletal  (+) Arthritis ,   Abdominal (+) - obese,   Peds negative pediatric ROS (+)  Hematology negative hematology ROS (+)   Anesthesia Other Findings Past Medical History: No date: Arthritis No date: GERD (gastroesophageal reflux disease)     Comment: RARE-TUMS   Reproductive/Obstetrics                             Anesthesia Physical  Anesthesia Plan  ASA: II  Anesthesia Plan: General   Post-op Pain Management:    Induction: Intravenous  PONV Risk Score and Plan: Propofol infusion  Airway Management Planned: Nasal Cannula  Additional Equipment:   Intra-op Plan:   Post-operative Plan:   Informed Consent: I have reviewed the patients History and Physical, chart, labs and discussed the procedure including the risks, benefits and alternatives for the proposed anesthesia with the patient or authorized representative who has indicated his/her understanding and acceptance.      Dental advisory given  Plan Discussed with: CRNA and Anesthesiologist  Anesthesia Plan Comments:         Anesthesia Quick Evaluation

## 2020-07-30 ENCOUNTER — Encounter: Payer: Self-pay | Admitting: Gastroenterology

## 2020-07-30 LAB — SURGICAL PATHOLOGY

## 2020-07-31 NOTE — Anesthesia Postprocedure Evaluation (Signed)
Anesthesia Post Note  Patient: Thomas Cross  Procedure(s) Performed: COLONOSCOPY WITH PROPOFOL (N/A )  Patient location during evaluation: Endoscopy Anesthesia Type: General Level of consciousness: awake and alert and oriented Pain management: pain level controlled Vital Signs Assessment: post-procedure vital signs reviewed and stable Respiratory status: spontaneous breathing Cardiovascular status: blood pressure returned to baseline Anesthetic complications: no   No complications documented.   Last Vitals:  Vitals:   07/27/20 0832 07/27/20 0842  BP: (!) 98/53 118/67  Pulse:    Resp:    Temp: 36.8 C   SpO2:      Last Pain:  Vitals:   07/28/20 0959  TempSrc:   PainSc: 0-No pain                 CARROLL,PAUL

## 2020-08-06 DIAGNOSIS — G4733 Obstructive sleep apnea (adult) (pediatric): Secondary | ICD-10-CM | POA: Diagnosis not present

## 2020-08-08 ENCOUNTER — Other Ambulatory Visit: Payer: Self-pay

## 2020-08-08 ENCOUNTER — Ambulatory Visit
Admission: RE | Admit: 2020-08-08 | Discharge: 2020-08-08 | Disposition: A | Discharge status: Home / Self Care | Payer: Medicare HMO | Source: Ambulatory Visit | Attending: Oncology | Admitting: Oncology

## 2020-08-08 DIAGNOSIS — Z122 Encounter for screening for malignant neoplasm of respiratory organs: Secondary | ICD-10-CM | POA: Diagnosis not present

## 2020-08-08 DIAGNOSIS — R69 Illness, unspecified: Secondary | ICD-10-CM | POA: Diagnosis not present

## 2020-08-08 DIAGNOSIS — Z87891 Personal history of nicotine dependence: Secondary | ICD-10-CM

## 2020-08-08 DIAGNOSIS — F17200 Nicotine dependence, unspecified, uncomplicated: Secondary | ICD-10-CM | POA: Diagnosis present

## 2020-08-14 ENCOUNTER — Encounter: Payer: Self-pay | Admitting: *Deleted

## 2020-08-17 DIAGNOSIS — G4733 Obstructive sleep apnea (adult) (pediatric): Secondary | ICD-10-CM | POA: Diagnosis not present

## 2020-08-21 DIAGNOSIS — L570 Actinic keratosis: Secondary | ICD-10-CM | POA: Diagnosis not present

## 2020-08-21 DIAGNOSIS — D2272 Melanocytic nevi of left lower limb, including hip: Secondary | ICD-10-CM | POA: Diagnosis not present

## 2020-08-21 DIAGNOSIS — D2261 Melanocytic nevi of right upper limb, including shoulder: Secondary | ICD-10-CM | POA: Diagnosis not present

## 2020-08-21 DIAGNOSIS — D2262 Melanocytic nevi of left upper limb, including shoulder: Secondary | ICD-10-CM | POA: Diagnosis not present

## 2020-08-21 DIAGNOSIS — X32XXXA Exposure to sunlight, initial encounter: Secondary | ICD-10-CM | POA: Diagnosis not present

## 2020-08-21 DIAGNOSIS — L821 Other seborrheic keratosis: Secondary | ICD-10-CM | POA: Diagnosis not present

## 2020-08-21 DIAGNOSIS — D225 Melanocytic nevi of trunk: Secondary | ICD-10-CM | POA: Diagnosis not present

## 2020-08-21 DIAGNOSIS — D2271 Melanocytic nevi of right lower limb, including hip: Secondary | ICD-10-CM | POA: Diagnosis not present

## 2020-08-23 ENCOUNTER — Encounter: Payer: Medicare HMO | Admitting: Family Medicine

## 2020-09-06 DIAGNOSIS — G4733 Obstructive sleep apnea (adult) (pediatric): Secondary | ICD-10-CM | POA: Diagnosis not present

## 2020-09-09 DIAGNOSIS — M1990 Unspecified osteoarthritis, unspecified site: Secondary | ICD-10-CM | POA: Diagnosis not present

## 2020-09-09 DIAGNOSIS — E663 Overweight: Secondary | ICD-10-CM | POA: Diagnosis not present

## 2020-09-09 DIAGNOSIS — N529 Male erectile dysfunction, unspecified: Secondary | ICD-10-CM | POA: Diagnosis not present

## 2020-09-09 DIAGNOSIS — R030 Elevated blood-pressure reading, without diagnosis of hypertension: Secondary | ICD-10-CM | POA: Diagnosis not present

## 2020-09-09 DIAGNOSIS — Z6827 Body mass index (BMI) 27.0-27.9, adult: Secondary | ICD-10-CM | POA: Diagnosis not present

## 2020-09-09 DIAGNOSIS — Z8249 Family history of ischemic heart disease and other diseases of the circulatory system: Secondary | ICD-10-CM | POA: Diagnosis not present

## 2020-09-09 DIAGNOSIS — R69 Illness, unspecified: Secondary | ICD-10-CM | POA: Diagnosis not present

## 2020-09-16 DIAGNOSIS — G4733 Obstructive sleep apnea (adult) (pediatric): Secondary | ICD-10-CM | POA: Diagnosis not present

## 2020-09-25 ENCOUNTER — Encounter: Payer: Self-pay | Admitting: Family Medicine

## 2020-09-25 ENCOUNTER — Other Ambulatory Visit: Payer: Self-pay

## 2020-09-25 ENCOUNTER — Ambulatory Visit (INDEPENDENT_AMBULATORY_CARE_PROVIDER_SITE_OTHER): Payer: Medicare HMO | Admitting: Family Medicine

## 2020-09-25 VITALS — BP 148/85 | HR 57 | Temp 98.1°F | Wt 202.0 lb

## 2020-09-25 DIAGNOSIS — E7800 Pure hypercholesterolemia, unspecified: Secondary | ICD-10-CM | POA: Diagnosis not present

## 2020-09-25 DIAGNOSIS — Z0000 Encounter for general adult medical examination without abnormal findings: Secondary | ICD-10-CM | POA: Diagnosis not present

## 2020-09-25 DIAGNOSIS — J449 Chronic obstructive pulmonary disease, unspecified: Secondary | ICD-10-CM | POA: Diagnosis not present

## 2020-09-25 DIAGNOSIS — E559 Vitamin D deficiency, unspecified: Secondary | ICD-10-CM

## 2020-09-25 DIAGNOSIS — Z23 Encounter for immunization: Secondary | ICD-10-CM | POA: Diagnosis not present

## 2020-09-25 DIAGNOSIS — N529 Male erectile dysfunction, unspecified: Secondary | ICD-10-CM

## 2020-09-25 MED ORDER — SILDENAFIL CITRATE 20 MG PO TABS: ORAL_TABLET | ORAL | 0 refills | Status: DC

## 2020-09-25 NOTE — Progress Notes (Signed)
Complete physical exam   Patient: Thomas Cross   DOB: July 13, 1955   65 y.o. Male  MRN: [id number] Visit Date: 09/25/2020  Today's healthcare provider: [name], PA-C   No chief complaint on file.  Subjective    Thomas Cross is a 65 y.o. male who presents today for a complete physical exam.  He reports consuming a general diet. The patient does not participate in regular exercise at present. He generally feels fairly well. He reports sleeping poorly. He does have additional problems to discuss today.     Past Medical History:  Diagnosis Date   Arthritis    GERD (gastroesophageal reflux disease)    RARE-TUMS   Sleep apnea    Past Surgical History:  Procedure Laterality Date   COLONOSCOPY  2009   COLONOSCOPY WITH PROPOFOL N/A 07/27/2020   Procedure: COLONOSCOPY WITH PROPOFOL;  Surgeon: Jonathon Bellows, MD;  Location: [facility] ENDOSCOPY;  Service: Gastroenterology;  Laterality: N/A;   HERNIA REPAIR  30 years ago   right inguinal   INGUINAL HERNIA REPAIR Right 05/20/2016   Procedure: LAPAROSCOPIC INGUINAL HERNIA;  Surgeon: Christene Lye, MD;  Location: [facility] ORS;  Service: General;  Laterality: Right;   TONSILLECTOMY AND ADENOIDECTOMY     Social History   Socioeconomic History   Marital status: Married    Spouse name: Not on file   Number of children: 2   Years of education: Not on file   Highest education level: High school graduate  Occupational History   Occupation: full time / owner  Tobacco Use   Smoking status: Current Every Day Smoker    Packs/day: 1.50    Years: 45.00    Pack years: 67.50    Types: Cigarettes   Smokeless tobacco: Never Used  Scientific laboratory technician Use: Never used  Substance and Sexual Activity   Alcohol use: Yes    Alcohol/week: 14.0 standard drinks    Types: 14 Shots of liquor per week    Comment: 2 MIXED DRINKS EVERY DAY   Drug use: No   Sexual activity: Not on file  Other Topics Concern   Not on file  Social History  Narrative   Not on file   Social Determinants of Health   Financial Resource Strain: Low Risk    Difficulty of Paying Living Expenses: Not hard at all  Food Insecurity: No Food Insecurity   Worried About Charity fundraiser in the Last Year: Never true   Ran Out of Food in the Last Year: Never true  Transportation Needs: No Transportation Needs   Lack of Transportation (Medical): No   Lack of Transportation (Non-Medical): No  Physical Activity: Inactive   Days of Exercise per Week: 0 days   Minutes of Exercise per Session: 0 min  Stress: Stress Concern Present   Feeling of Stress : To some extent  Social Connections: Moderately Integrated   Frequency of Communication with Friends and Family: More than three times a week   Frequency of Social Gatherings with Friends and Family: More than three times a week   Attends Religious Services: Never   Marine scientist or Organizations: Yes   Attends Music therapist: More than 4 times per year   Marital Status: Married  Human resources officer Violence: Not At Risk   Fear of Current or Ex-Partner: No   Emotionally Abused: No   Physically Abused: No   Sexually Abused: No   Family Status  Relation  Name Status   Mother  Deceased   Father  Deceased   Brother 1 Deceased   MGF  Deceased   PGM  Deceased   PGF  Deceased   Sister  (Not Specified)   Brother 2 (Not Specified)   Brother 49 (Not Specified)   Brother 91 (Not Specified)   Family History  Problem Relation Age of Onset   Heart disease Mother    Kidney cancer Mother    Bladder Cancer Mother    Arthritis Mother    CVA Father    Prostate cancer Father    Dementia Father    Hypertension Father    Heart attack Father    Heart attack Brother    Hyperlipidemia Brother    Heart disease Maternal Grandfather    Stroke Paternal Grandmother    Colon cancer Paternal Grandfather    Hyperlipidemia Sister    Hyperlipidemia Brother    Hyperlipidemia Brother     Hyperlipidemia Brother    No Known Allergies  Patient Care Team: Taneia Mealor, Vickki Muff, PA-C as PCP - General (Family Medicine) Kate Sable, MD as PCP - Cardiology (Cardiology) Thelma Comp, OD (Optometry)   Medications: Outpatient Medications Prior to Visit  Medication Sig   aspirin EC 81 MG tablet Take 81 mg by mouth every evening.    calcium carbonate (TUMS - DOSED IN MG ELEMENTAL CALCIUM) 500 MG chewable tablet Chew 1 tablet by mouth as needed for indigestion or heartburn.   Cholecalciferol 1000 units capsule Take 1,000 Units by mouth every evening.    Multiple Vitamins-Minerals (MULTIVITAMIN WITH MINERALS) tablet Take 1 tablet by mouth every evening.    Omega-3 Fatty Acids (FISH OIL) 1000 MG CPDR Take 1 capsule by mouth every evening.    VITAMIN D PO Take by mouth. (Patient not taking: Reported on 06/25/2020)   Vitamins-Lipotropics (LIPOFLAVONOID) TABS Take 1 tablet by mouth every evening.    No facility-administered medications prior to visit.    Review of Systems  Constitutional: Positive for diaphoresis and fatigue.  HENT: Positive for sore throat and tinnitus.   Eyes: Negative.   Respiratory: Positive for cough, shortness of breath and wheezing.   Cardiovascular: Positive for chest pain.  Gastrointestinal: Negative.   Endocrine: Negative.   Genitourinary: Negative.   Musculoskeletal: Positive for arthralgias, back pain and neck pain.  Skin: Negative.   Allergic/Immunologic: Negative.   Neurological: Negative.   Hematological: Bruises/bleeds easily.  Psychiatric/Behavioral: Negative.       Objective    BP (!) 148/85 (BP Location: Right Arm, Patient Position: Sitting, Cuff Size: Normal)   Pulse (!) 57   Temp 98.1 F (36.7 C) (Oral)   Wt 202 lb (91.6 kg)   SpO2 99%   BMI 27.40 kg/m    Physical Exam Constitutional:      Appearance: Normal appearance. He is normal weight.  HENT:     Head: Normocephalic and atraumatic.     Right Ear: Tympanic  membrane, ear canal and external ear normal.     Left Ear: Tympanic membrane, ear canal and external ear normal.     Nose: Nose normal.     Mouth/Throat:     Mouth: Mucous membranes are moist.     Pharynx: Oropharynx is clear.  Eyes:     Extraocular Movements: Extraocular movements intact.     Conjunctiva/sclera: Conjunctivae normal.     Pupils: Pupils are equal, round, and reactive to light.     Comments: Vision coning in in the left eye -  followed by ophthalmologist.  Cardiovascular:     Rate and Rhythm: Normal rate and regular rhythm.     Pulses: Normal pulses.     Heart sounds: Normal heart sounds.  Pulmonary:     Effort: Pulmonary effort is normal.     Breath sounds: Normal breath sounds.  Abdominal:     General: Abdomen is flat. Bowel sounds are normal.     Palpations: Abdomen is soft.  Genitourinary:    Penis: Normal.      Testes: Normal.     Prostate: Normal.     Rectum: Normal. Guaiac result positive.  Musculoskeletal:        General: Normal range of motion.     Cervical back: Normal range of motion and neck supple.  Skin:    General: Skin is warm and dry.  Neurological:     General: No focal deficit present.     Mental Status: He is alert and oriented to person, place, and time. Mental status is at baseline.  Psychiatric:        Mood and Affect: Mood normal.        Behavior: Behavior normal.        Thought Content: Thought content normal.        Judgment: Judgment normal.     Last depression screening scores PHQ 2/9 Scores 06/25/2020 06/23/2019 03/22/2018  PHQ - 2 Score 0 0 0   Last fall risk screening Fall Risk  06/25/2020  Falls in the past year? 0  Number falls in past yr: 0  Injury with Fall? 0  Follow up -   Last Audit-C alcohol use screening Alcohol Use Disorder Test (AUDIT) 06/25/2020  1. How often do you have a drink containing alcohol? 4  2. How many drinks containing alcohol do you have on a typical day when you are drinking? 0  3. How often do you  have six or more drinks on one occasion? 0  AUDIT-C Score 4  4. How often during the last year have you found that you were not able to stop drinking once you had started? 0  5. How often during the last year have you failed to do what was normally expected from you because of drinking? 0  6. How often during the last year have you needed a first drink in the morning to get yourself going after a heavy drinking session? 0  7. How often during the last year have you had a feeling of guilt of remorse after drinking? 0  8. How often during the last year have you been unable to remember what happened the night before because you had been drinking? 0  9. Have you or someone else been injured as a result of your drinking? 0  10. Has a relative or friend or a doctor or another health worker been concerned about your drinking or suggested you cut down? 0  Alcohol Use Disorder Identification Test Final Score (AUDIT) 4   A score of 3 or more in women, and 4 or more in men indicates increased risk for alcohol abuse, EXCEPT if all of the points are from question 1   No results found for any visits on 09/25/20.  Assessment & Plan    Routine Health Maintenance and Physical Exam  Exercise Activities and Dietary recommendations Goals   Recommend low fat diet and exercise 30-40 minutes 3-4 days a week.     Immunization History  Administered Date(s) Administered   Pneumococcal Polysaccharide-23 11/08/2019  Tdap 03/18/2011    Health Maintenance  Topic Date Due   COVID-19 Vaccine (1) Never done   PNA vac Low Risk Adult (2 of 2 - PCV13) 11/07/2020   INFLUENZA VACCINE  12/17/2020   TETANUS/TDAP  03/17/2021   COLONOSCOPY (Pts 45-78yrs Insurance coverage will need to be confirmed)  07/28/2027   Hepatitis C Screening  Completed   HPV VACCINES  Aged Out    Discussed health benefits of physical activity, and encouraged him to engage in regular exercise appropriate for his age and condition.  1.  Encounter for Medicare annual wellness exam General health stable. Counseled regarding health maintenance and need to stop smoking. Will check with his pharmacy regarding Prevnar vaccination.  2. Hypercholesterolemia without hypertriglyceridemia Trying to follow low fat diet. Job is very physical and no time for exercise program. Taking Omega-3 Fish oil 1000 mg qd. Recheck labs. HDL 37 and LDL 131 a year ago. Did not start the pravastatin prescribed last year. - Comprehensive metabolic panel - Lipid panel - TSH  3. Chronic obstructive pulmonary disease, unspecified COPD type ([place]) Denies dyspnea, chronic cough or sputum production. Low dose CT scan showed centrilobular and paraseptal emphysema. Still smoking 1.5 ppd for the past 45 years. Encouraged to work on smoking cessation.  4. Erectile dysfunction, unspecified erectile dysfunction type Will recheck labs and refill Sildenafil. Feels it has helped. - CBC with Differential/Platelet - Comprehensive metabolic panel - PSA - sildenafil (REVATIO) 20 MG tablet; Take 1-2 tablets by mouth 1-4 hours prior to intercourse.  Dispense: 30 tablet; Refill: 0  5. Avitaminosis D Vitamin D level low at 24.5 a year ago. Been taking 1000 IU occasionally. Recheck level. - CBC with Differential/Platelet - VITAMIN D 25 Hydroxy (Vit-D Deficiency, Fractures)  6. Need for Streptococcus pneumoniae vaccination Wants to check with insurance and get vaccination at his pharmacy.   No follow-ups on file.     I, Herschell Virani, PA-C, have reviewed all documentation for this visit. The documentation on 09/25/20 for the exam, diagnosis, procedures, and orders are all accurate and complete.    [name], PA-C  [employer] [phone] (phone) [phone] (fax)  [place]

## 2020-09-28 DIAGNOSIS — N529 Male erectile dysfunction, unspecified: Secondary | ICD-10-CM | POA: Diagnosis not present

## 2020-09-28 DIAGNOSIS — E7800 Pure hypercholesterolemia, unspecified: Secondary | ICD-10-CM | POA: Diagnosis not present

## 2020-09-28 DIAGNOSIS — E559 Vitamin D deficiency, unspecified: Secondary | ICD-10-CM | POA: Diagnosis not present

## 2020-09-29 LAB — CBC WITH DIFFERENTIAL/PLATELET
Basophils Absolute: 0.1 10*3/uL (ref 0.0–0.2)
Basos: 1 %
EOS (ABSOLUTE): 0.2 10*3/uL (ref 0.0–0.4)
Eos: 2 %
Hematocrit: 45.9 % (ref 37.5–51.0)
Hemoglobin: 16.3 g/dL (ref 13.0–17.7)
Immature Grans (Abs): 0 10*3/uL (ref 0.0–0.1)
Immature Granulocytes: 0 %
Lymphocytes Absolute: 1.5 10*3/uL (ref 0.7–3.1)
Lymphs: 19 %
MCH: 35.1 pg — ABNORMAL HIGH (ref 26.6–33.0)
MCHC: 35.5 g/dL (ref 31.5–35.7)
MCV: 99 fL — ABNORMAL HIGH (ref 79–97)
Monocytes Absolute: 0.8 10*3/uL (ref 0.1–0.9)
Monocytes: 11 %
Neutrophils Absolute: 5.1 10*3/uL (ref 1.4–7.0)
Neutrophils: 67 %
Platelets: 230 10*3/uL (ref 150–450)
RBC: 4.64 x10E6/uL (ref 4.14–5.80)
RDW: 12.3 % (ref 11.6–15.4)
WBC: 7.6 10*3/uL (ref 3.4–10.8)

## 2020-09-29 LAB — COMPREHENSIVE METABOLIC PANEL
ALT: 13 IU/L (ref 0–44)
AST: 13 IU/L (ref 0–40)
Albumin/Globulin Ratio: 1.8 (ref 1.2–2.2)
Albumin: 4.1 g/dL (ref 3.8–4.8)
Alkaline Phosphatase: 124 IU/L — ABNORMAL HIGH (ref 44–121)
BUN/Creatinine Ratio: 15 (ref 10–24)
BUN: 16 mg/dL (ref 8–27)
Bilirubin Total: 0.5 mg/dL (ref 0.0–1.2)
CO2: 21 mmol/L (ref 20–29)
Calcium: 9 mg/dL (ref 8.6–10.2)
Chloride: 103 mmol/L (ref 96–106)
Creatinine, Ser: 1.1 mg/dL (ref 0.76–1.27)
Globulin, Total: 2.3 g/dL (ref 1.5–4.5)
Glucose: 88 mg/dL (ref 65–99)
Potassium: 4.9 mmol/L (ref 3.5–5.2)
Sodium: 137 mmol/L (ref 134–144)
Total Protein: 6.4 g/dL (ref 6.0–8.5)
eGFR: 74 mL/min/{1.73_m2} (ref >59)

## 2020-09-29 LAB — LIPID PANEL
Chol/HDL Ratio: 5.4 ratio — ABNORMAL HIGH (ref 0.0–5.0)
Cholesterol, Total: 185 mg/dL (ref 100–199)
HDL: 34 mg/dL — ABNORMAL LOW (ref >39)
LDL Chol Calc (NIH): 127 mg/dL — ABNORMAL HIGH (ref 0–99)
Triglycerides: 132 mg/dL (ref 0–149)
VLDL Cholesterol Cal: 24 mg/dL (ref 5–40)

## 2020-09-29 LAB — TSH: TSH: 2.18 u[IU]/mL (ref 0.450–4.500)

## 2020-09-29 LAB — VITAMIN D 25 HYDROXY (VIT D DEFICIENCY, FRACTURES): Vit D, 25-Hydroxy: 32.2 ng/mL (ref 30.0–100.0)

## 2020-09-29 LAB — PSA: Prostate Specific Ag, Serum: 0.3 ng/mL (ref 0.0–4.0)

## 2020-10-06 DIAGNOSIS — G4733 Obstructive sleep apnea (adult) (pediatric): Secondary | ICD-10-CM | POA: Diagnosis not present

## 2020-10-17 DIAGNOSIS — G4733 Obstructive sleep apnea (adult) (pediatric): Secondary | ICD-10-CM | POA: Diagnosis not present

## 2020-10-29 ENCOUNTER — Telehealth: Payer: Self-pay

## 2020-10-29 NOTE — Telephone Encounter (Signed)
Copy printed and placed at front desk   Copied from [place] [phone]. Topic: General - Other >> Oct 26, 2020  1:15 PM Thomas Cross wrote: Pt wife would like to come pick up a copy of her husband last blood work results today

## 2020-11-26 ENCOUNTER — Ambulatory Visit: Payer: Medicare HMO | Admitting: Cardiology

## 2020-11-26 ENCOUNTER — Other Ambulatory Visit: Payer: Self-pay

## 2020-11-26 ENCOUNTER — Encounter: Payer: Self-pay | Admitting: Cardiology

## 2020-11-26 VITALS — BP 142/70 | HR 65 | Ht 72.0 in | Wt 200.0 lb

## 2020-11-26 DIAGNOSIS — R0600 Dyspnea, unspecified: Secondary | ICD-10-CM

## 2020-11-26 DIAGNOSIS — R69 Illness, unspecified: Secondary | ICD-10-CM | POA: Diagnosis not present

## 2020-11-26 DIAGNOSIS — G4733 Obstructive sleep apnea (adult) (pediatric): Secondary | ICD-10-CM | POA: Diagnosis not present

## 2020-11-26 DIAGNOSIS — F17200 Nicotine dependence, unspecified, uncomplicated: Secondary | ICD-10-CM

## 2020-11-26 NOTE — Patient Instructions (Signed)

## 2020-11-26 NOTE — Progress Notes (Signed)
Cardiology Office Note:    Date:  11/26/2020   ID:  JEFFRE ENRIQUES, DOB [birth year], MRN [id number]  PCP:  Margo Common, PA-C  Cardiologist:  Kate Sable, MD  Electrophysiologist:  None   Referring MD: Margo Common, PA-C   Chief Complaint  Patient presents with   Other    12 month follow up -- Meds reviewed verbally with patient.     History of Present Illness:    Thomas Cross is a 65 y.o. male with a hx of GERD, arthritis, current smoker x40+ years, OSA, COPD who presents for follow-up.    Previously seen due to shortness of breath, snoring.  Was referred to pulmonary medicine due to concerns for OSA and also history of smoking.  Work-up did reveal OSA and COPD.  Management being planned as per pulmonary medicine.  Denies chest pain.  Was initially compliant with CPAP mask which causes energy levels to go up, started having throat pain and not as compliant anymore.  He still smokes, had PFTs obtained, has not followed up for results or recommendations.  Prior notes Echo 09/6810 normal systolic and diastolic function, EF 55 to 60%    Past Medical History:  Diagnosis Date   Arthritis    GERD (gastroesophageal reflux disease)    RARE-TUMS   Sleep apnea     Past Surgical History:  Procedure Laterality Date   COLONOSCOPY  2009   COLONOSCOPY WITH PROPOFOL N/A 07/27/2020   Procedure: COLONOSCOPY WITH PROPOFOL;  Surgeon: Jonathon Bellows, MD;  Location: [facility] ENDOSCOPY;  Service: Gastroenterology;  Laterality: N/A;   HERNIA REPAIR  30 years ago   right inguinal   INGUINAL HERNIA REPAIR Right 05/20/2016   Procedure: LAPAROSCOPIC INGUINAL HERNIA;  Surgeon: Christene Lye, MD;  Location: [facility] ORS;  Service: General;  Laterality: Right;   TONSILLECTOMY AND ADENOIDECTOMY      Current Medications: Current Meds  Medication Sig   aspirin EC 81 MG tablet Take 81 mg by mouth every evening.    calcium carbonate (TUMS - DOSED IN MG ELEMENTAL CALCIUM) 500 MG chewable  tablet Chew 1 tablet by mouth as needed for indigestion or heartburn.   Cholecalciferol 1000 units capsule Take 1,000 Units by mouth every evening.    Multiple Vitamins-Minerals (MULTIVITAMIN WITH MINERALS) tablet Take 1 tablet by mouth every evening.    Omega-3 Fatty Acids (FISH OIL) 1000 MG CPDR Take 1 capsule by mouth every evening.    sildenafil (REVATIO) 20 MG tablet Take 1-2 tablets by mouth 1-4 hours prior to intercourse.   Vitamins-Lipotropics (LIPOFLAVONOID) TABS Take 1 tablet by mouth every evening.      Allergies:   Patient has no known allergies.   Social History   Socioeconomic History   Marital status: Married    Spouse name: Not on file   Number of children: 2   Years of education: Not on file   Highest education level: High school graduate  Occupational History   Occupation: full time / owner  Tobacco Use   Smoking status: Every Day    Packs/day: 1.50    Years: 45.00    Pack years: 67.50    Types: Cigarettes   Smokeless tobacco: Never  Vaping Use   Vaping Use: Never used  Substance and Sexual Activity   Alcohol use: Yes    Alcohol/week: 14.0 standard drinks    Types: 14 Shots of liquor per week    Comment: 2 MIXED DRINKS EVERY DAY   Drug use:  No   Sexual activity: Not on file  Other Topics Concern   Not on file  Social History Narrative   Not on file   Social Determinants of Health   Financial Resource Strain: Low Risk    Difficulty of Paying Living Expenses: Not hard at all  Food Insecurity: No Food Insecurity   Worried About Running Out of Food in the Last Year: Never true   [place] in the Last Year: Never true  Transportation Needs: No Transportation Needs   Lack of Transportation (Medical): No   Lack of Transportation (Non-Medical): No  Physical Activity: Inactive   Days of Exercise per Week: 0 days   Minutes of Exercise per Session: 0 min  Stress: Stress Concern Present   Feeling of Stress : To some extent  Social Connections:  Moderately Integrated   Frequency of Communication with Friends and Family: More than three times a week   Frequency of Social Gatherings with Friends and Family: More than three times a week   Attends Religious Services: Never   Marine scientist or Organizations: Yes   Attends Music therapist: More than 4 times per year   Marital Status: Married     Family History: The patient's family history includes Arthritis in his mother; Bladder Cancer in his mother; CVA in his father; Colon cancer in his paternal grandfather; Dementia in his father; Heart attack in his brother and father; Heart disease in his maternal grandfather and mother; Hyperlipidemia in his brother, brother, brother, brother, and sister; Hypertension in his father; Kidney cancer in his mother; Prostate cancer in his father; Stroke in his paternal grandmother.  ROS:   Please see the history of present illness.     All other systems reviewed and are negative.  EKGs/Labs/Other Studies Reviewed:    The following studies were reviewed today:   EKG:  EKG is  ordered today.  The ekg ordered today demonstrates normal sinus rhythm with sinus arrhythmia otherwise normal ECG.  Recent Labs: 09/28/2020: ALT 13; BUN 16; Creatinine, Ser 1.10; Hemoglobin 16.3; Platelets 230; Potassium 4.9; Sodium 137; TSH 2.180  Recent Lipid Panel    Component Value Date/Time   CHOL 185 09/28/2020 0826   TRIG 132 09/28/2020 0826   HDL 34 (L) 09/28/2020 0826   CHOLHDL 5.4 (H) 09/28/2020 0826   CHOLHDL 5.1 (H) 04/08/2017 0816   LDLCALC 127 (H) 09/28/2020 0826   LDLCALC 140 (H) 04/08/2017 0816    Physical Exam:    VS:  BP (!) 142/70 (BP Location: Left Arm, Patient Position: Sitting, Cuff Size: Normal)   Pulse 65   Ht 6' (1.829 m)   Wt 200 lb (90.7 kg)   SpO2 98%   BMI 27.12 kg/m     Wt Readings from Last 3 Encounters:  11/26/20 200 lb (90.7 kg)  09/25/20 202 lb (91.6 kg)  08/08/20 195 lb (88.5 kg)     GEN:  Well  nourished, well developed in no acute distress HEENT: Normal NECK: No JVD; No carotid bruits LYMPHATICS: No lymphadenopathy CARDIAC: RRR, no murmurs, rubs, gallops RESPIRATORY:  Clear to auscultation without rales, wheezing or rhonchi  ABDOMEN: Soft, non-tender, non-distended MUSCULOSKELETAL:  No edema; No deformity  SKIN: Warm and dry NEUROLOGIC:  Alert and oriented x 3 PSYCHIATRIC:  Normal affect   ASSESSMENT:    1. Dyspnea, unspecified type   2. Smoking   3. OSA (obstructive sleep apnea)     PLAN:     In  order of problems listed above:  Patient with history of shortness of breath.  Echocardiogram showed normal systolic and diastolic function, EF 55 to 60%.    Dyspnea and somnolence likely due to OSA, COPD, current smoking.  I do not think this is an anginal equivalent. Current smoker x40+ years.  Smoking cessation again advised. OSA diagnosed by pulmonary medicine.  CPAP compliance advised, keep appointment with pulmonary medicine regarding PFT results, CPAP mask fitting/titrations.  Follow-up as needed  This note was generated in part or whole with voice recognition software. Voice recognition is usually quite accurate but there are transcription errors that can and very often do occur. I apologize for any typographical errors that were not detected and corrected.  Medication Adjustments/Labs and Tests Ordered: Current medicines are reviewed at length with the patient today.  Concerns regarding medicines are outlined above.  Orders Placed This Encounter  Procedures   EKG 12-Lead    No orders of the defined types were placed in this encounter.   Patient Instructions  Medication Instructions:   Your physician recommends that you continue on your current medications as directed. Please refer to the Current Medication list given to you today.  *If you need a refill on your cardiac medications before your next appointment, please call your pharmacy*   Lab Work: None  ordered If you have labs (blood work) drawn today and your tests are completely normal, you will receive your results only by: [place] (if you have MyChart) OR A paper copy in the mail If you have any lab test that is abnormal or we need to change your treatment, we will call you to review the results.   Testing/Procedures:  None ordered   Follow-Up: At [facility], you and your health needs are our priority.  As part of our continuing mission to provide you with exceptional heart care, we have created designated Provider Care Teams.  These Care Teams include your primary Cardiologist (physician) and Advanced Practice Providers (APPs -  Physician Assistants and Nurse Practitioners) who all work together to provide you with the care you need, when you need it.  We recommend signing up for the patient portal called "MyChart".  Sign up information is provided on this After Visit Summary.  MyChart is used to connect with patients for Virtual Visits (Telemedicine).  Patients are able to view lab/test results, encounter notes, upcoming appointments, etc.  Non-urgent messages can be sent to your provider as well.   To learn more about what you can do with MyChart, go to [email or web address].    Your next appointment:   Follow up as needed   The format for your next appointment:   In Person  Provider:   Kate Sable, MD   Other Instructions     Signed, Kate Sable, MD  11/26/2020 12:27 PM    [place]

## 2021-01-04 DIAGNOSIS — H47323 Drusen of optic disc, bilateral: Secondary | ICD-10-CM | POA: Diagnosis not present

## 2021-06-10 ENCOUNTER — Ambulatory Visit: Payer: Self-pay

## 2021-06-10 NOTE — Telephone Encounter (Signed)
Patient called, left VM to return the call to the office to discuss symptoms with a nurse.   Summary: heart race only at night when he's in the bed   Pt called in to schedule an appt. Pt says that he has noticed that some nights when he's resting laying in bed his heart starts to race. Scheduled pt for office next available. Pt says that this doesn't happen all the time but some time and would like to discuss this further.

## 2021-06-10 NOTE — Telephone Encounter (Signed)
°  Summary: heart race only at night when he's in the bed    Pt called in to schedule an appt. Pt says that he has noticed that some nights when he's resting laying in bed his heart starts to race. Scheduled pt for office next available. Pt says that this doesn't happen all the time but some time and would like to discuss this further.           Called pt. LMOM to return call.

## 2021-06-11 NOTE — Telephone Encounter (Signed)
Moved appt to 06/13/21 for evaluation. KW

## 2021-06-13 ENCOUNTER — Encounter: Payer: Self-pay | Admitting: Physician Assistant

## 2021-06-13 ENCOUNTER — Ambulatory Visit (INDEPENDENT_AMBULATORY_CARE_PROVIDER_SITE_OTHER): Payer: Medicare HMO | Admitting: Physician Assistant

## 2021-06-13 ENCOUNTER — Other Ambulatory Visit: Payer: Self-pay

## 2021-06-13 ENCOUNTER — Inpatient Hospital Stay (INDEPENDENT_AMBULATORY_CARE_PROVIDER_SITE_OTHER): Payer: Medicare HMO

## 2021-06-13 VITALS — BP 185/93 | HR 58 | Temp 98.4°F | Ht 72.0 in | Wt 204.2 lb

## 2021-06-13 DIAGNOSIS — R002 Palpitations: Secondary | ICD-10-CM | POA: Insufficient documentation

## 2021-06-13 DIAGNOSIS — F17210 Nicotine dependence, cigarettes, uncomplicated: Secondary | ICD-10-CM | POA: Diagnosis not present

## 2021-06-13 DIAGNOSIS — R030 Elevated blood-pressure reading, without diagnosis of hypertension: Secondary | ICD-10-CM | POA: Diagnosis not present

## 2021-06-13 DIAGNOSIS — R69 Illness, unspecified: Secondary | ICD-10-CM | POA: Diagnosis not present

## 2021-06-13 HISTORY — DX: Elevated blood-pressure reading, without diagnosis of hypertension: R03.0

## 2021-06-13 NOTE — Assessment & Plan Note (Signed)
Acute, new problem  BP elevated today in office Recommend he take BP readings at home and bring records to next apt to discuss and determine potential HTN Follow up in 4-6 weeks

## 2021-06-13 NOTE — Assessment & Plan Note (Signed)
Chronic, historic problem  Patient states he is currently smoking about 1.5 ppd but is trying to gradually decrease cigarette consumption Recommend he continue with these efforts and provided goal with motivational interviewing to be down to 1 pack or less by next apt Discussed general smoking cessation options and resources that are available. - Patient is amenable to resources Recommend AAA screening as he is 60 and has extensive smoking hx Recommend low dose CT for lung cancer screening  Follow up in 4-6 weeks

## 2021-06-13 NOTE — Patient Instructions (Addendum)
I would like you to take your Blood pressure once per day for the next month - use an upper arm cuff as these are more accurate Please record your readings and bring them with you to your next apt Keep decreasing your cigarettes, I would like you to get down to 1 pack per day if possible before your next follow up I am ordering a Zio heart monitor for you to wear for 14 days- this will come to your house with instructions and after you send it in we will get the results and can discuss next steps for your palpitations  It was nice to meet you and I appreciate the opportunity to be involved in your care

## 2021-06-13 NOTE — Progress Notes (Signed)
Established patient visit   Patient: Thomas Cross   DOB: [birth year]   66 y.o. Male  MRN: [id number] Visit Date: 06/13/2021  Today's healthcare provider: Dani Gobble Branda Chaudhary, PA-C  Introduced myself to the patient as a Journalist, newspaper and provided education on APPs in clinical practice.    CC: palpitations   No chief complaint on file.  Subjective    HPI  States he is concerned for his heart racing at night States it is happening once or twice per week Feels that he wakes up and his heart is pounding, doesn't think it is going too fast but feels like beats are very prominent Denies diaphoresis, chills, nausea, back pain, vision changes, tinnitus,   Reports increased stress lately- has property under contract and states this has been stressful for him.   Reports that it wakes him up  Reports back pain around belt line for the past week  Patient is a current everyday smoker 1-1.5 ppd, has smoked since he was 66yo   Medications: Outpatient Medications Prior to Visit  Medication Sig   aspirin EC 81 MG tablet Take 81 mg by mouth every evening.    calcium carbonate (TUMS - DOSED IN MG ELEMENTAL CALCIUM) 500 MG chewable tablet Chew 1 tablet by mouth as needed for indigestion or heartburn.   Cholecalciferol 1000 units capsule Take 1,000 Units by mouth every evening.    Multiple Vitamins-Minerals (MULTIVITAMIN WITH MINERALS) tablet Take 1 tablet by mouth every evening.    Omega-3 Fatty Acids (FISH OIL) 1000 MG CPDR Take 1 capsule by mouth every evening.    sildenafil (REVATIO) 20 MG tablet Take 1-2 tablets by mouth 1-4 hours prior to intercourse.   Vitamins-Lipotropics (LIPOFLAVONOID) TABS Take 1 tablet by mouth every evening.    No facility-administered medications prior to visit.    Review of Systems  Constitutional:  Negative for appetite change, chills and fever.  Respiratory:  Negative for chest tightness, shortness of breath and wheezing.   Cardiovascular:  Positive for palpitations.  Negative for chest pain.  Gastrointestinal:  Negative for abdominal pain, nausea and vomiting.  Musculoskeletal:  Positive for back pain.      Objective    There were no vitals taken for this visit. {Show previous vital signs (optional):23777}  Physical Exam Vitals reviewed.  Constitutional:      General: He is awake.     Appearance: Normal appearance. He is well-developed, well-groomed and overweight.  HENT:     Head: Normocephalic and atraumatic.  Cardiovascular:     Rate and Rhythm: Normal rate and regular rhythm.     Pulses: Normal pulses.     Heart sounds: Normal heart sounds.  Pulmonary:     Effort: Pulmonary effort is normal.     Breath sounds: Normal breath sounds. No wheezing, rhonchi or rales.  Neurological:     General: No focal deficit present.     Mental Status: He is alert.  Psychiatric:        Mood and Affect: Mood normal.        Behavior: Behavior normal. Behavior is cooperative.        Thought Content: Thought content normal.        Judgment: Judgment normal.      No results found for any visits on 06/13/21.  Assessment & Plan     Problem List Items Addressed This Visit       Other   Smoking greater than 30 pack years  Chronic, historic problem  Patient states he is currently smoking about 1.5 ppd but is trying to gradually decrease cigarette consumption Recommend he continue with these efforts and provided goal with motivational interviewing to be down to 1 pack or less by next apt Discussed general smoking cessation options and resources that are available. - Patient is amenable to resources Recommend AAA screening as he is 60 and has extensive smoking hx Recommend low dose CT for lung cancer screening  Follow up in 4-6 weeks       Elevated BP without diagnosis of hypertension    Acute, new problem  BP elevated today in office Recommend he take BP readings at home and bring records to next apt to discuss and determine potential HTN Follow  up in 4-6 weeks        Palpitations - Primary    Acute, new problem Normal EKG in office today Recommend extended monitoring with Zio device  Will assess these results after monitoring is complete Cannot rule out stress and potential anxiety as contributing to pounding sensation but need to ensure underlying cardiac issues are excluded first Follow up in 4-6 weeks to review results and determine next steps       Relevant Orders   EKG 12-Lead (Completed)   VAS [place] AAA DUPLEX   LONG TERM MONITOR (3-14 DAYS)     Return in about 4 weeks (around 07/11/2021) for Palpitations, smoking, establish with Ria Comment.   I, Carolynne Schuchard E Chelsei Mcchesney, PA-C, have reviewed all documentation for this visit. The documentation on 06/13/21 for the exam, diagnosis, procedures, and orders are all accurate and complete.   Zidane Renner, Glennie Isle MPH [facility]    No follow-ups on file.        Almon Register, PA-C  [employer] [phone] (phone) [phone] (fax)  [place]

## 2021-06-13 NOTE — Assessment & Plan Note (Signed)
Acute, new problem Normal EKG in office today Recommend extended monitoring with Zio device  Will assess these results after monitoring is complete Cannot rule out stress and potential anxiety as contributing to pounding sensation but need to ensure underlying cardiac issues are excluded first Follow up in 4-6 weeks to review results and determine next steps

## 2021-06-20 DIAGNOSIS — R002 Palpitations: Secondary | ICD-10-CM | POA: Diagnosis not present

## 2021-06-24 ENCOUNTER — Ambulatory Visit: Payer: Medicare HMO | Admitting: Family Medicine

## 2021-07-01 ENCOUNTER — Ambulatory Visit (INDEPENDENT_AMBULATORY_CARE_PROVIDER_SITE_OTHER): Payer: Medicare HMO

## 2021-07-01 DIAGNOSIS — Z0000 Encounter for general adult medical examination without abnormal findings: Secondary | ICD-10-CM

## 2021-07-01 DIAGNOSIS — Z122 Encounter for screening for malignant neoplasm of respiratory organs: Secondary | ICD-10-CM | POA: Diagnosis not present

## 2021-07-01 NOTE — Progress Notes (Signed)
Virtual Visit via Telephone Note  I connected with  [name] on 07/01/21 at  1:40 PM EST by telephone and verified that I am speaking with the correct person using two identifiers.  Location: Patient: home  Provider: [facility] Persons participating in the virtual visit: [place]   I discussed the limitations, risks, security and privacy concerns of performing an evaluation and management service by telephone and the availability of in person appointments. The patient expressed understanding and agreed to proceed.  Interactive audio and video telecommunications were attempted between this nurse and patient, however failed, due to patient having technical difficulties OR patient did not have access to video capability.  We continued and completed visit with audio only.  Some vital signs may be absent or patient reported.   Thomas David, LPN  Subjective:   Thomas Cross is a 66 y.o. male who presents for Medicare Annual/Subsequent preventive examination.  Review of Systems           Objective:    There were no vitals filed for this visit. There is no height or weight on file to calculate BMI.  Advanced Directives 07/27/2020 06/25/2020 05/09/2016  Does Patient Have a Medical Advance Directive? No No No  Would patient like information on creating a medical advance directive? - No - Patient declined -    Current Medications (verified) Outpatient Encounter Medications as of 07/01/2021  Medication Sig   aspirin EC 81 MG tablet Take 81 mg by mouth every evening.  (Patient not taking: Reported on 06/13/2021)   calcium carbonate (TUMS - DOSED IN MG ELEMENTAL CALCIUM) 500 MG chewable tablet Chew 1 tablet by mouth as needed for indigestion or heartburn.   Cholecalciferol 1000 units capsule Take 1,000 Units by mouth every evening.    Multiple Vitamins-Minerals (MULTIVITAMIN WITH MINERALS) tablet Take 1 tablet by mouth every evening.  (Patient not taking: Reported on  06/13/2021)   Omega-3 Fatty Acids (FISH OIL) 1000 MG CPDR Take 1 capsule by mouth every evening.    sildenafil (REVATIO) 20 MG tablet Take 1-2 tablets by mouth 1-4 hours prior to intercourse.   Vitamins-Lipotropics (LIPOFLAVONOID) TABS Take 1 tablet by mouth every evening.    No facility-administered encounter medications on file as of 07/01/2021.    Allergies (verified) Patient has no known allergies.   History: Past Medical History:  Diagnosis Date   Arthritis    GERD (gastroesophageal reflux disease)    RARE-TUMS   Sleep apnea    Past Surgical History:  Procedure Laterality Date   COLONOSCOPY  2009   COLONOSCOPY WITH PROPOFOL N/A 07/27/2020   Procedure: COLONOSCOPY WITH PROPOFOL;  Surgeon: Jonathon Bellows, MD;  Location: [facility] ENDOSCOPY;  Service: Gastroenterology;  Laterality: N/A;   HERNIA REPAIR  30 years ago   right inguinal   INGUINAL HERNIA REPAIR Right 05/20/2016   Procedure: LAPAROSCOPIC INGUINAL HERNIA;  Surgeon: Christene Lye, MD;  Location: [facility] ORS;  Service: General;  Laterality: Right;   TONSILLECTOMY AND ADENOIDECTOMY     Family History  Problem Relation Age of Onset   Heart disease Mother    Kidney cancer Mother    Bladder Cancer Mother    Arthritis Mother    CVA Father    Prostate cancer Father    Dementia Father    Hypertension Father    Heart attack Father    Heart attack Brother    Hyperlipidemia Brother    Heart disease Maternal Grandfather    Stroke Paternal Grandmother  Colon cancer Paternal Grandfather    Hyperlipidemia Sister    Hyperlipidemia Brother    Hyperlipidemia Brother    Hyperlipidemia Brother    Social History   Socioeconomic History   Marital status: Married    Spouse name: Not on file   Number of children: 2   Years of education: Not on file   Highest education level: High school graduate  Occupational History   Occupation: full time / owner  Tobacco Use   Smoking status: Every Day    Packs/day: 1.50    Years:  45.00    Pack years: 67.50    Types: Cigarettes   Smokeless tobacco: Never  Vaping Use   Vaping Use: Never used  Substance and Sexual Activity   Alcohol use: Yes    Alcohol/week: 14.0 standard drinks    Types: 14 Shots of liquor per week    Comment: 2 MIXED DRINKS EVERY DAY   Drug use: No   Sexual activity: Not on file  Other Topics Concern   Not on file  Social History Narrative   Not on file   Social Determinants of Health   Financial Resource Strain: Low Risk    Difficulty of Paying Living Expenses: Not hard at all  Food Insecurity: No Food Insecurity   Worried About Charity fundraiser in the Last Year: Never true   Ran Out of Food in the Last Year: Never true  Transportation Needs: No Transportation Needs   Lack of Transportation (Medical): No   Lack of Transportation (Non-Medical): No  Physical Activity: Inactive   Days of Exercise per Week: 0 days   Minutes of Exercise per Session: 0 min  Stress: Stress Concern Present   Feeling of Stress : To some extent  Social Connections: Moderately Integrated   Frequency of Communication with Friends and Family: More than three times a week   Frequency of Social Gatherings with Friends and Family: More than three times a week   Attends Religious Services: Never   Marine scientist or Organizations: Yes   Attends Music therapist: More than 4 times per year   Marital Status: Married    Tobacco Counseling Ready to quit: Not Answered Counseling given: Not Answered   Clinical Intake:  Pre-visit preparation completed: Yes  Pain : No/denies pain     Nutritional Risks: None Diabetes: No  How often do you need to have someone help you when you read instructions, pamphlets, or other written materials from your doctor or pharmacy?: 1 - Never  Diabetic?no  Interpreter Needed?: No  Information entered by :: Kirke Shaggy, LPN   Activities of Daily Living In your present state of health, do you have  any difficulty performing the following activities: 06/13/2021 09/25/2020  Hearing? Thomas Cross  Vision? N N  Difficulty concentrating or making decisions? N N  Walking or climbing stairs? Y N  Dressing or bathing? N N  Doing errands, shopping? N N  Some recent data might be hidden    Patient Care Team: [name], PA-C as PCP - General (Physician Assistant) Kate Sable, MD as PCP - Cardiology (Cardiology) Thelma Comp, [place] (Optometry)  Indicate any recent Medical Services you may have received from other than [facility] providers in the past year (date may be approximate).     Assessment:   This is a routine wellness examination for Thomas Cross.  Hearing/Vision screen No results found.  Dietary issues and exercise activities discussed:     Goals Addressed  None    Depression Screen PHQ 2/9 Scores 06/13/2021 09/25/2020 06/25/2020 06/23/2019 03/22/2018 06/30/2016 03/11/2016  PHQ - 2 Score 0 0 0 0 0 0 0  PHQ- 9 Score 5 3 - - - - -    Fall Risk Fall Risk  06/13/2021 09/25/2020 06/25/2020 09/26/2019 06/23/2019  Falls in the past year? 0 0 0 0 0  Number falls in past yr: 0 0 0 0 -  Injury with Fall? 0 0 0 0 -  Risk for fall due to : No Fall Risks - - - -  Follow up - - - Falls evaluation completed -    FALL RISK PREVENTION PERTAINING TO THE HOME:  Any stairs in or around the home? Yes  If so, are there any without handrails? No  Home free of loose throw rugs in walkways, pet beds, electrical cords, etc? Yes  Adequate lighting in your home to reduce risk of falls? Yes   ASSISTIVE DEVICES UTILIZED TO PREVENT FALLS:  Life alert? No  Use of a cane, walker or w/c? No  Grab bars in the bathroom? No  Shower chair or bench in shower? Yes  Elevated toilet seat or a handicapped toilet? Yes   Cognitive Function:    6CIT Screen 03/11/2016  What Year? 0 points  What month? 0 points  What time? 0 points  Count back from 20 0 points  Months in reverse 2 points  Repeat phrase 2 points   Total Score 4    Immunizations Immunization History  Administered Date(s) Administered   Pneumococcal Polysaccharide-23 11/08/2019   Tdap 03/18/2011    TDAP status: Due, Education has been provided regarding the importance of this vaccine. Advised may receive this vaccine at local pharmacy or Health Dept. Aware to provide a copy of the vaccination record if obtained from local pharmacy or Health Dept. Verbalized acceptance and understanding.  Flu Vaccine status: Declined, Education has been provided regarding the importance of this vaccine but patient still declined. Advised may receive this vaccine at local pharmacy or Health Dept. Aware to provide a copy of the vaccination record if obtained from local pharmacy or Health Dept. Verbalized acceptance and understanding.  Pneumococcal vaccine status: Declined,  Education has been provided regarding the importance of this vaccine but patient still declined. Advised may receive this vaccine at local pharmacy or Health Dept. Aware to provide a copy of the vaccination record if obtained from local pharmacy or Health Dept. Verbalized acceptance and understanding.   Covid-19 vaccine status: Completed vaccines  Qualifies for Shingles Vaccine? Yes   Zostavax completed No   Shingrix Completed?: No.    Education has been provided regarding the importance of this vaccine. Patient has been advised to call insurance company to determine out of pocket expense if they have not yet received this vaccine. Advised may also receive vaccine at local pharmacy or Health Dept. Verbalized acceptance and understanding.  Screening Tests Health Maintenance  Topic Date Due   COVID-19 Vaccine (1) Never done   Zoster Vaccines- Shingrix (1 of 2) Never done   Pneumonia Vaccine 89+ Years old (2 - PCV) 11/07/2020   INFLUENZA VACCINE  Never done   TETANUS/TDAP  03/17/2021   COLONOSCOPY (Pts 45-65yrs Insurance coverage will need to be confirmed)  07/28/2027   Hepatitis C  Screening  Completed   HPV VACCINES  Aged Out    Health Maintenance  Health Maintenance Due  Topic Date Due   COVID-19 Vaccine (1) Never done   Zoster Vaccines-  Shingrix (1 of 2) Never done   Pneumonia Vaccine 79+ Years old (2 - PCV) 11/07/2020   INFLUENZA VACCINE  Never done   TETANUS/TDAP  03/17/2021     Colorectal cancer screening: Type of screening: Colonoscopy. Completed 07/27/20. Repeat every 5 years  Lung Cancer Screening: (Low Dose CT Chest recommended if Age 85-80 years, 30 pack-year currently smoking OR have quit w/in 15years.) does qualify.   Lung Cancer Screening Referral: sent today  Additional Screening:  Hepatitis C Screening: does qualify; Completed 04/29/13  Vision Screening: Recommended annual ophthalmology exams for early detection of glaucoma and other disorders of the eye. Is the patient up to date with their annual eye exam?  Yes  Who is the provider or what is the name of the office in which the patient attends annual eye exams? [facility] in [place] If pt is not established with a provider, would they like to be referred to a provider to establish care? No .   Dental Screening: Recommended annual dental exams for proper oral hygiene  Community Resource Referral / Chronic Care Management: CRR required this visit?  No   CCM required this visit?  No      Plan:     I have personally reviewed and noted the following in the patients chart:   Medical and social history Use of alcohol, tobacco or illicit drugs  Current medications and supplements including opioid prescriptions. Patient is not currently taking opioid prescriptions. Functional ability and status Nutritional status Physical activity Advanced directives List of other physicians Hospitalizations, surgeries, and ER visits in previous 12 months Vitals Screenings to include cognitive, depression, and falls Referrals and appointments  In addition, I have reviewed and  discussed with patient certain preventive protocols, quality metrics, and best practice recommendations. A written personalized care plan for preventive services as well as general preventive health recommendations were provided to patient.     Thomas David, LPN   11/05/3557   Nurse Notes: none

## 2021-07-01 NOTE — Patient Instructions (Addendum)
[name] , Thank you for taking time to come for your Medicare Wellness Visit. I appreciate your ongoing commitment to your health goals. Please review the following plan we discussed and let me know if I can assist you in the future.   Screening recommendations/referrals: Colonoscopy: 07/27/20 Recommended yearly ophthalmology/optometry visit for glaucoma screening and checkup Recommended yearly dental visit for hygiene and checkup  Vaccinations: Influenza vaccine: n/d Pneumococcal vaccine: n/d Tdap vaccine: 03/18/11 Shingles vaccine:n /d   Covid-19: had first 2 shots  Advanced directives: no  Conditions/risks identified: none  Next appointment: Follow up in one year for your annual wellness visit. - 07/02/22 @ 1:40pm by phone  Preventive Care 68 Years and Older, Male Preventive care refers to lifestyle choices and visits with your health care provider that can promote health and wellness. What does preventive care include? A yearly physical exam. This is also called an annual well check. Dental exams once or twice a year. Routine eye exams. Ask your health care provider how often you should have your eyes checked. Personal lifestyle choices, including: Daily care of your teeth and gums. Regular physical activity. Eating a healthy diet. Avoiding tobacco and drug use. Limiting alcohol use. Practicing safe sex. Taking low doses of aspirin every day. Taking vitamin and mineral supplements as recommended by your health care provider. What happens during an annual well check? The services and screenings done by your health care provider during your annual well check will depend on your age, overall health, lifestyle risk factors, and family history of disease. Counseling  Your health care provider may ask you questions about your: Alcohol use. Tobacco use. Drug use. Emotional well-being. Home and relationship well-being. Sexual activity. Eating habits. History of  falls. Memory and ability to understand (cognition). Work and work Statistician. Screening  You may have the following tests or measurements: Height, weight, and BMI. Blood pressure. Lipid and cholesterol levels. These may be checked every 5 years, or more frequently if you are over 54 years old. Skin check. Lung cancer screening. You may have this screening every year starting at age 8 if you have a 30-pack-year history of smoking and currently smoke or have quit within the past 15 years. Fecal occult blood test (FOBT) of the stool. You may have this test every year starting at age 45. Flexible sigmoidoscopy or colonoscopy. You may have a sigmoidoscopy every 5 years or a colonoscopy every 10 years starting at age 62. Prostate cancer screening. Recommendations will vary depending on your family history and other risks. Hepatitis C blood test. Hepatitis B blood test. Sexually transmitted disease (STD) testing. Diabetes screening. This is done by checking your blood sugar (glucose) after you have not eaten for a while (fasting). You may have this done every 1-3 years. Abdominal aortic aneurysm (AAA) screening. You may need this if you are a current or former smoker. Osteoporosis. You may be screened starting at age 19 if you are at high risk. Talk with your health care provider about your test results, treatment options, and if necessary, the need for more tests. Vaccines  Your health care provider may recommend certain vaccines, such as: Influenza vaccine. This is recommended every year. Tetanus, diphtheria, and acellular pertussis (Tdap, Td) vaccine. You may need a Td booster every 10 years. Zoster vaccine. You may need this after age 81. Pneumococcal 13-valent conjugate (PCV13) vaccine. One dose is recommended after age 5. Pneumococcal polysaccharide (PPSV23) vaccine. One dose is recommended after age 29. Talk to your health  care provider about which screenings and vaccines you need and  how often you need them. This information is not intended to replace advice given to you by your health care provider. Make sure you discuss any questions you have with your health care provider. Document Released: 06/01/2015 Document Revised: 01/23/2016 Document Reviewed: 03/06/2015 Elsevier Interactive Patient Education  2017 [place] Prevention in the Home Falls can cause injuries. They can happen to people of all ages. There are many things you can do to make your home safe and to help prevent falls. What can I do on the outside of my home? Regularly fix the edges of walkways and driveways and fix any cracks. Remove anything that might make you trip as you walk through a door, such as a raised step or threshold. Trim any bushes or trees on the path to your home. Use bright outdoor lighting. Clear any walking paths of anything that might make someone trip, such as rocks or tools. Regularly check to see if handrails are loose or broken. Make sure that both sides of any steps have handrails. Any raised decks and porches should have guardrails on the edges. Have any leaves, snow, or ice cleared regularly. Use sand or salt on walking paths during winter. Clean up any spills in your garage right away. This includes oil or grease spills. What can I do in the bathroom? Use night lights. Install grab bars by the toilet and in the tub and shower. Do not use towel bars as grab bars. Use non-skid mats or decals in the tub or shower. If you need to sit down in the shower, use a plastic, non-slip stool. Keep the floor dry. Clean up any water that spills on the floor as soon as it happens. Remove soap buildup in the tub or shower regularly. Attach bath mats securely with double-sided non-slip rug tape. Do not have throw rugs and other things on the floor that can make you trip. What can I do in the bedroom? Use night lights. Make sure that you have a light by your bed that is easy to  reach. Do not use any sheets or blankets that are too big for your bed. They should not hang down onto the floor. Have a firm chair that has side arms. You can use this for support while you get dressed. Do not have throw rugs and other things on the floor that can make you trip. What can I do in the kitchen? Clean up any spills right away. Avoid walking on wet floors. Keep items that you use a lot in easy-to-reach places. If you need to reach something above you, use a strong step stool that has a grab bar. Keep electrical cords out of the way. Do not use floor polish or wax that makes floors slippery. If you must use wax, use non-skid floor wax. Do not have throw rugs and other things on the floor that can make you trip. What can I do with my stairs? Do not leave any items on the stairs. Make sure that there are handrails on both sides of the stairs and use them. Fix handrails that are broken or loose. Make sure that handrails are as long as the stairways. Check any carpeting to make sure that it is firmly attached to the stairs. Fix any carpet that is loose or worn. Avoid having throw rugs at the top or bottom of the stairs. If you do have throw rugs, attach them to  the floor with carpet tape. Make sure that you have a light switch at the top of the stairs and the bottom of the stairs. If you do not have them, ask someone to add them for you. What else can I do to help prevent falls? Wear shoes that: Do not have high heels. Have rubber bottoms. Are comfortable and fit you well. Are closed at the toe. Do not wear sandals. If you use a stepladder: Make sure that it is fully opened. Do not climb a closed stepladder. Make sure that both sides of the stepladder are locked into place. Ask someone to hold it for you, if possible. Clearly mark and make sure that you can see: Any grab bars or handrails. First and last steps. Where the edge of each step is. Use tools that help you move  around (mobility aids) if they are needed. These include: Canes. Walkers. Scooters. Crutches. Turn on the lights when you go into a dark area. Replace any light bulbs as soon as they burn out. Set up your furniture so you have a clear path. Avoid moving your furniture around. If any of your floors are uneven, fix them. If there are any pets around you, be aware of where they are. Review your medicines with your doctor. Some medicines can make you feel dizzy. This can increase your chance of falling. Ask your doctor what other things that you can do to help prevent falls. This information is not intended to replace advice given to you by your health care provider. Make sure you discuss any questions you have with your health care provider. Document Released: 03/01/2009 Document Revised: 10/11/2015 Document Reviewed: 06/09/2014 Elsevier Interactive Patient Education  2017 [employer].

## 2021-07-12 NOTE — Telephone Encounter (Signed)
Signing encounter see previous note on 07/25/20 °

## 2021-07-15 DIAGNOSIS — R002 Palpitations: Secondary | ICD-10-CM | POA: Diagnosis not present

## 2021-07-24 NOTE — Progress Notes (Signed)
I,[name],acting as a Education administrator for [employer], PA-C.,have documented all relevant documentation on the behalf of [name], PA-C,as directed by  [name], PA-C while in the presence of [name], PA-C.  Established Patient Office Visit  Subjective:  Patient ID: Thomas Cross, male    DOB: August 20, 1955  Age: 66 y.o. MRN: [id number]  CC: palpitation follow up  HPI Thomas Cross presents for 6 week palpation follow-up. Patient reports wore monitor for 2 weeks and here to follow-up. Reports daughter checks bp at home manually and it is in normal range at home. Describes palpitations as his heart racing, he can 'feel it through his whole body'. Usually at night. He will feel it, it wakes him up, and he can't go back to sleep. Occasionally feels throughout the day. Denies associated dizziness, sob, changes in vision, headaches, weakness.   Has significant stress at home with wife's health condition, issues with farm, potentially needing to move. Smokes, occasionally drinks.   Past Medical History:  Diagnosis Date   Arthritis    GERD (gastroesophageal reflux disease)    RARE-TUMS   Sleep apnea     Past Surgical History:  Procedure Laterality Date   COLONOSCOPY  2009   COLONOSCOPY WITH PROPOFOL N/A 07/27/2020   Procedure: COLONOSCOPY WITH PROPOFOL;  Surgeon: Jonathon Bellows, MD;  Location: [facility] ENDOSCOPY;  Service: Gastroenterology;  Laterality: N/A;   HERNIA REPAIR  30 years ago   right inguinal   INGUINAL HERNIA REPAIR Right 05/20/2016   Procedure: LAPAROSCOPIC INGUINAL HERNIA;  Surgeon: Christene Lye, MD;  Location: [facility] ORS;  Service: General;  Laterality: Right;   TONSILLECTOMY AND ADENOIDECTOMY      Family History  Problem Relation Age of Onset   Heart disease Mother    Kidney cancer Mother    Bladder Cancer Mother    Arthritis Mother    CVA Father    Prostate cancer Father    Dementia Father    Hypertension Father    Heart attack Father     Heart attack Brother    Hyperlipidemia Brother    Heart disease Maternal Grandfather    Stroke Paternal Grandmother    Colon cancer Paternal Grandfather    Hyperlipidemia Sister    Hyperlipidemia Brother    Hyperlipidemia Brother    Hyperlipidemia Brother     Social History   Socioeconomic History   Marital status: Married    Spouse name: Not on file   Number of children: 2   Years of education: Not on file   Highest education level: High school graduate  Occupational History   Occupation: full time / Financial controller  Tobacco Use   Smoking status: Every Day    Packs/day: 1.50    Years: 45.00    Pack years: 67.50    Types: Cigarettes   Smokeless tobacco: Never  Vaping Use   Vaping Use: Never used  Substance and Sexual Activity   Alcohol use: Yes    Alcohol/week: 14.0 standard drinks    Types: 14 Shots of liquor per week    Comment: 2 MIXED DRINKS EVERY DAY   Drug use: No   Sexual activity: Not on file  Other Topics Concern   Not on file  Social History Narrative   Not on file   Social Determinants of Health   Financial Resource Strain: Low Risk    Difficulty of Paying Living Expenses: Not hard at all  Food Insecurity: No Food Insecurity   Worried About Running  Out of Food in the Last Year: Never true   Ran Out of Food in the Last Year: Never true  Transportation Needs: No Transportation Needs   Lack of Transportation (Medical): No   Lack of Transportation (Non-Medical): No  Physical Activity: Insufficiently Active   Days of Exercise per Week: 3 days   Minutes of Exercise per Session: 30 min  Stress: No Stress Concern Present   Feeling of Stress : Only a little  Social Connections: Engineer, building services of Communication with Friends and Family: More than three times a week   Frequency of Social Gatherings with Friends and Family: More than three times a week   Attends Religious Services: More than 4 times per year   Active Member of [employer] or Organizations:  Yes   Attends Music therapist: More than 4 times per year   Marital Status: Married  Human resources officer Violence: Not At Risk   Fear of Current or Ex-Partner: No   Emotionally Abused: No   Physically Abused: No   Sexually Abused: No    Outpatient Medications Prior to Visit  Medication Sig Dispense Refill   aspirin EC 81 MG tablet Take 81 mg by mouth every evening.     calcium carbonate (TUMS - DOSED IN MG ELEMENTAL CALCIUM) 500 MG chewable tablet Chew 1 tablet by mouth as needed for indigestion or heartburn.     Cholecalciferol 1000 units capsule Take 1,000 Units by mouth every evening.      Multiple Vitamins-Minerals (MULTIVITAMIN WITH MINERALS) tablet Take 1 tablet by mouth every evening.     Omega-3 Fatty Acids (FISH OIL) 1000 MG CPDR Take 1 capsule by mouth every evening.      sildenafil (REVATIO) 20 MG tablet Take 1-2 tablets by mouth 1-4 hours prior to intercourse. 30 tablet 0   Vitamins-Lipotropics (LIPOFLAVONOID) TABS Take 1 tablet by mouth every evening.      No facility-administered medications prior to visit.    No Known Allergies  ROS Review of Systems  Constitutional:  Negative for fatigue and fever.  Respiratory:  Negative for cough and shortness of breath.   Cardiovascular:  Positive for palpitations. Negative for chest pain and leg swelling.  Neurological:  Negative for dizziness and headaches.     Objective:    Physical Exam Constitutional:      Appearance: Normal appearance. He is not ill-appearing.  HENT:     Head: Normocephalic.  Eyes:     Conjunctiva/sclera: Conjunctivae normal.  Cardiovascular:     Rate and Rhythm: Normal rate and regular rhythm.     Pulses: Normal pulses.  Pulmonary:     Effort: Pulmonary effort is normal.     Breath sounds: Normal breath sounds.  Neurological:     Mental Status: He is oriented to person, place, and time.  Psychiatric:        Mood and Affect: Mood normal.        Behavior: Behavior normal.    BP  (!) 172/82 (BP Location: Right Arm, Patient Position: Sitting, Cuff Size: Normal)    Pulse (!) 56    Ht 6' (1.829 m)    Wt 203 lb 11.2 oz (92.4 kg)    SpO2 100%    BMI 27.63 kg/m  Wt Readings from Last 3 Encounters:  07/25/21 203 lb 11.2 oz (92.4 kg)  06/13/21 204 lb 3.2 oz (92.6 kg)  11/26/20 200 lb (90.7 kg)     Health Maintenance Due  Topic Date Due  COVID-19 Vaccine (1) Never done   Zoster Vaccines- Shingrix (1 of 2) Never done   Pneumonia Vaccine 5+ Years old (2 - PCV) 11/07/2020   TETANUS/TDAP  03/17/2021    There are no preventive care reminders to display for this patient.  Lab Results  Component Value Date   TSH 2.180 09/28/2020   Lab Results  Component Value Date   WBC 7.6 09/28/2020   HGB 16.3 09/28/2020   HCT 45.9 09/28/2020   MCV 99 (H) 09/28/2020   PLT 230 09/28/2020   Lab Results  Component Value Date   NA 137 09/28/2020   K 4.9 09/28/2020   CO2 21 09/28/2020   GLUCOSE 88 09/28/2020   BUN 16 09/28/2020   CREATININE 1.10 09/28/2020   BILITOT 0.5 09/28/2020   ALKPHOS 124 (H) 09/28/2020   AST 13 09/28/2020   ALT 13 09/28/2020   PROT 6.4 09/28/2020   ALBUMIN 4.1 09/28/2020   CALCIUM 9.0 09/28/2020   EGFR 74 09/28/2020   Lab Results  Component Value Date   CHOL 185 09/28/2020   Lab Results  Component Value Date   HDL 34 (L) 09/28/2020   Lab Results  Component Value Date   LDLCALC 127 (H) 09/28/2020   Lab Results  Component Value Date   TRIG 132 09/28/2020   Lab Results  Component Value Date   CHOLHDL 5.4 (H) 09/28/2020   No results found for: HGBA1C    Assessment & Plan:   Problem List Items Addressed This Visit       Other   Elevated BP without diagnosis of hypertension    Pt reports normal values at home when checked by daughter-- 130s140s/70s-80s. Will continue to monitor, pt not amenable to medication regardless.       Palpitations - Primary    Zio revealed runs of SVT. None sustained. Explained to patient what this  is, advised of triggers-- smoking ,drinking, anxiety,stress, caffeine.  Long discussion on home life. As he feels unable to avoid triggers, discussed starting metoprolol 25 mg ER daily, but at this time he would like to avoid medication. We can revisit if he feels the palpitations occur more frequently.         Follow-up: Return in about 3 months (around 10/25/2021) for CPE.    I, [name], PA-C have reviewed all documentation for this visit. The documentation on  07/25/2021 for the exam, diagnosis, procedures, and orders are all accurate and complete.  [name], PA-C [facility] [address] [place], [place], [address] Office: [phone] Fax: [phone]

## 2021-07-25 ENCOUNTER — Ambulatory Visit (INDEPENDENT_AMBULATORY_CARE_PROVIDER_SITE_OTHER): Payer: Medicare HMO | Admitting: Physician Assistant

## 2021-07-25 ENCOUNTER — Other Ambulatory Visit: Payer: Self-pay

## 2021-07-25 ENCOUNTER — Encounter: Payer: Self-pay | Admitting: Physician Assistant

## 2021-07-25 VITALS — BP 172/82 | HR 56 | Ht 72.0 in | Wt 203.7 lb

## 2021-07-25 DIAGNOSIS — R002 Palpitations: Secondary | ICD-10-CM

## 2021-07-25 DIAGNOSIS — R030 Elevated blood-pressure reading, without diagnosis of hypertension: Secondary | ICD-10-CM | POA: Diagnosis not present

## 2021-07-25 NOTE — Assessment & Plan Note (Signed)
Pt reports normal values at home when checked by daughter-- 130s140s/70s-80s. ?Will continue to monitor, pt not amenable to medication regardless.  ?

## 2021-07-25 NOTE — Assessment & Plan Note (Signed)
Zio revealed runs of SVT. None sustained. ?Explained to patient what this is, advised of triggers-- smoking ,drinking, anxiety,stress, caffeine.  ?Long discussion on home life. ?As he feels unable to avoid triggers, discussed starting metoprolol 25 mg ER daily, but at this time he would like to avoid medication. ?We can revisit if he feels the palpitations occur more frequently.  ?

## 2021-07-26 ENCOUNTER — Other Ambulatory Visit: Payer: Self-pay

## 2021-07-26 ENCOUNTER — Ambulatory Visit (INDEPENDENT_AMBULATORY_CARE_PROVIDER_SITE_OTHER): Payer: Medicare HMO | Admitting: Family Medicine

## 2021-07-26 ENCOUNTER — Encounter: Payer: Self-pay | Admitting: Family Medicine

## 2021-07-26 VITALS — BP 165/81 | HR 54 | Temp 97.3°F | Ht 72.0 in | Wt 208.3 lb

## 2021-07-26 DIAGNOSIS — E782 Mixed hyperlipidemia: Secondary | ICD-10-CM | POA: Insufficient documentation

## 2021-07-26 DIAGNOSIS — Z125 Encounter for screening for malignant neoplasm of prostate: Secondary | ICD-10-CM | POA: Diagnosis not present

## 2021-07-26 DIAGNOSIS — E559 Vitamin D deficiency, unspecified: Secondary | ICD-10-CM | POA: Diagnosis not present

## 2021-07-26 DIAGNOSIS — R69 Illness, unspecified: Secondary | ICD-10-CM | POA: Diagnosis not present

## 2021-07-26 DIAGNOSIS — I10 Essential (primary) hypertension: Secondary | ICD-10-CM | POA: Diagnosis not present

## 2021-07-26 DIAGNOSIS — R002 Palpitations: Secondary | ICD-10-CM

## 2021-07-26 DIAGNOSIS — L0211 Cutaneous abscess of neck: Secondary | ICD-10-CM | POA: Insufficient documentation

## 2021-07-26 DIAGNOSIS — Z716 Tobacco abuse counseling: Secondary | ICD-10-CM

## 2021-07-26 DIAGNOSIS — R030 Elevated blood-pressure reading, without diagnosis of hypertension: Secondary | ICD-10-CM | POA: Diagnosis not present

## 2021-07-26 DIAGNOSIS — E079 Disorder of thyroid, unspecified: Secondary | ICD-10-CM | POA: Insufficient documentation

## 2021-07-26 DIAGNOSIS — R001 Bradycardia, unspecified: Secondary | ICD-10-CM | POA: Diagnosis not present

## 2021-07-26 DIAGNOSIS — Z131 Encounter for screening for diabetes mellitus: Secondary | ICD-10-CM | POA: Diagnosis not present

## 2021-07-26 DIAGNOSIS — F17210 Nicotine dependence, cigarettes, uncomplicated: Secondary | ICD-10-CM

## 2021-07-26 MED ORDER — CEPHALEXIN 500 MG PO CAPS: 500.0000 mg | ORAL_CAPSULE | Freq: Two times a day (BID) | ORAL | 0 refills | Status: DC

## 2021-07-26 NOTE — Assessment & Plan Note (Signed)
Seen on exam; Continue to monitor ?

## 2021-07-26 NOTE — Assessment & Plan Note (Signed)
A1c ?OW with HTN and mixed HLD, high RFs with coupled tobacco ?Continue to recommend balanced, lower carb meals. Smaller meal size, adding snacks. Choosing water as drink of choice and increasing purposeful exercise. ? ?

## 2021-07-26 NOTE — Assessment & Plan Note (Signed)
Chronic tobacco ?Does not wish to reduce/quit ? ?

## 2021-07-26 NOTE — Assessment & Plan Note (Signed)
Hx of low vit D; on supplement ?Discussed fat soluble vitamin ?Will repeat labs ?

## 2021-07-26 NOTE — Assessment & Plan Note (Signed)
Will repeat NFLP today ?We recommend diet low in saturated fat and regular exercise - 30 min at least 5 times per week ? ?

## 2021-07-26 NOTE — Assessment & Plan Note (Signed)
Chronic, stable on home logs x2 week readings done by daughter ?Does not wish to be on medication ?Discussed risks of chronic elevated blood pressure- patient does not believe he has HTN ?Will check NFLP and CMP ?

## 2021-07-26 NOTE — Progress Notes (Signed)
?  ? ?I,[name],acting as a scribe for Gwyneth Sprout, FNP.,have documented all relevant documentation on the behalf of Gwyneth Sprout, FNP,as directed by  Gwyneth Sprout, FNP while in the presence of Gwyneth Sprout, FNP. ? ? ?Established patient visit ? ? ?Patient: Thomas Cross   DOB: 12-Mar-1956   66 y.o. Male  MRN: [id number] ?Visit Date: 07/26/2021 ? ?Today's healthcare provider: Gwyneth Sprout, FNP  ? ?Introduced to Designer, jewellery role and practice setting.  All questions answered.  Discussed provider/patient relationship and expectations. ? ? ?Chief Complaint  ?Patient presents with  ? Mass  ? ?Subjective  ?  ?HPI  ? ?Patient is being seen today for lump on his neck that he noticed about the size of a pinto bean. States his neck has been sore for a while but thought he had just slept wrong. Neck has been sore for a little over a week. Located on right side with some soreness when touched.  ? ?Medications: ?Outpatient Medications Prior to Visit  ?Medication Sig  ? aspirin EC 81 MG tablet Take 81 mg by mouth every evening.  ? calcium carbonate (TUMS - DOSED IN MG ELEMENTAL CALCIUM) 500 MG chewable tablet Chew 1 tablet by mouth as needed for indigestion or heartburn.  ? Cholecalciferol 1000 units capsule Take 1,000 Units by mouth every evening.   ? Multiple Vitamins-Minerals (MULTIVITAMIN WITH MINERALS) tablet Take 1 tablet by mouth every evening.  ? Omega-3 Fatty Acids (FISH OIL) 1000 MG CPDR Take 1 capsule by mouth every evening.   ? sildenafil (REVATIO) 20 MG tablet Take 1-2 tablets by mouth 1-4 hours prior to intercourse.  ? Vitamins-Lipotropics (LIPOFLAVONOID) TABS Take 1 tablet by mouth every evening.   ? ?No facility-administered medications prior to visit.  ? ? ?Review of Systems ? ? ?  Objective  ?  ?BP (!) 165/81 (BP Location: Right Arm, Patient Position: Sitting, Cuff Size: Large)   Pulse (!) 54   Temp (!) 97.3 ?F (36.3 ?C) (Temporal)   Ht 6' (1.829 m)   Wt 208 lb 4.8 oz (94.5 kg)   SpO2  100%   BMI 28.25 kg/m?  ? ?BP Readings from Last 3 Encounters:  ?07/26/21 (!) 165/81  ?07/25/21 (!) 172/82  ?06/13/21 (!) 185/93  ? ?Wt Readings from Last 3 Encounters:  ?07/26/21 208 lb 4.8 oz (94.5 kg)  ?07/25/21 203 lb 11.2 oz (92.4 kg)  ?06/13/21 204 lb 3.2 oz (92.6 kg)  ? ?  ? ?Physical Exam ?Vitals and nursing note reviewed.  ?Constitutional:   ?   General: He is not in acute distress. ?   Appearance: Normal appearance. He is not ill-appearing, toxic-appearing or diaphoretic.  ?HENT:  ?   Head: Normocephalic and atraumatic.  ?Eyes:  ?   Pupils: Pupils are equal, round, and reactive to light.  ?Neck:  ?   Thyroid: No thyroid mass, thyromegaly or thyroid tenderness.  ? ?Cardiovascular:  ?   Rate and Rhythm: Normal rate and regular rhythm.  ?   Pulses: Normal pulses.  ?   Heart sounds: Normal heart sounds.  ?Pulmonary:  ?   Effort: Pulmonary effort is normal.  ?   Breath sounds: Normal breath sounds.  ?Musculoskeletal:     ?   General: Normal range of motion.  ?   Cervical back: Normal range of motion and neck supple. Erythema and tenderness present. No edema or rigidity. Pain with movement present. Normal range of motion.  ?Lymphadenopathy:  ?  Cervical: No cervical adenopathy.  ?   Right cervical: No superficial, deep or posterior cervical adenopathy. ?   Left cervical: No superficial, deep or posterior cervical adenopathy.  ?Skin: ?   General: Skin is warm and dry.  ?   Capillary Refill: Capillary refill takes less than 2 seconds.  ?Neurological:  ?   General: No focal deficit present.  ?   Mental Status: He is alert and oriented to person, place, and time. Mental status is at baseline.  ?Psychiatric:     ?   Mood and Affect: Mood normal.     ?   Behavior: Behavior normal.     ?   Thought Content: Thought content normal.     ?   Judgment: Judgment normal.  ?  ? ?No results found for any visits on 07/26/21. ? Assessment & Plan  ?  ? ?Problem List Items Addressed This Visit   ? ?  ? Cardiovascular and  Mediastinum  ? Primary hypertension  ?  Chronic, stable on home logs x2 week readings done by daughter ?Does not wish to be on medication ?Discussed risks of chronic elevated blood pressure- patient does not believe he has HTN ?Will check NFLP and CMP ?  ?  ? Relevant Orders  ? Comprehensive Metabolic Panel (CMET)  ?  ? Musculoskeletal and Integument  ? Cutaneous abscess of neck - Primary  ?  Acute concern, will start Abx ?encouraged to use warm compress ?No lymph involvement ?Discuss RTC protocol if not improved  ?Will check CBC ?ROM deficit d/t slight edema from superficial infection ? ? ? ?  ?  ? Relevant Medications  ? cephALEXin (KEFLEX) 500 MG capsule  ? Other Relevant Orders  ? CBC with Differential/Platelet  ?  ? Other  ? Avitaminosis D  ?  Hx of low vit D; on supplement ?Discussed fat soluble vitamin ?Will repeat labs ?  ?  ? Relevant Orders  ? Vitamin D (25 hydroxy)  ? Bradycardia with 51-60 beats per minute  ?  Seen on exam; Continue to monitor ?  ?  ? Encounter for counseling for tobacco use disorder  ?  3-5 minute discuss regarding risks of tobacco/nicotine use and recommendations on ways to reduce use and work towards cessation of use of tobacco/nicotine products. Encouraged to use 1-800-QUIT-NOW. ? ?  ?  ? Mixed hyperlipidemia  ?  Will repeat NFLP today ?We recommend diet low in saturated fat and regular exercise - 30 min at least 5 times per week ? ?  ?  ? Relevant Orders  ? Lipid panel  ? Palpitations  ?  Zio 2 week wear revealed SVT; none of which were sustained ?Patient previously educated and advised of triggers-- smoking, ETOH, anxiety, stress, caffeine, sleep deficit, etc.  ?Does not wish to be on beta blocker to assist ?  ?  ? Relevant Orders  ? TSH + free T4  ? Screening for diabetes mellitus  ?  A1c ?OW with HTN and mixed HLD, high RFs with coupled tobacco ?Continue to recommend balanced, lower carb meals. Smaller meal size, adding snacks. Choosing water as drink of choice and increasing  purposeful exercise. ? ?  ?  ? Relevant Orders  ? Hemoglobin A1c  ? Hemoglobin A1c  ? Screening for malignant neoplasm of prostate  ?  Will screen with PSA ?Defer DRE ?  ?  ? Relevant Orders  ? PSA  ? Tobacco dependence due to cigarettes  ?  Chronic tobacco ?  Does not wish to reduce/quit ? ?  ?  ? ? ?Return in about 3 months (around 10/26/2021) for annual examination.  ?   ? ?I, Gwyneth Sprout, FNP, have reviewed all documentation for this visit. The documentation on 07/26/21 for the exam, diagnosis, procedures, and orders are all accurate and complete. ? ? ? ?Gwyneth Sprout, FNP  ?[place] ?[phone] (phone) ?[phone] (fax) ? ?[facility] ? ?

## 2021-07-26 NOTE — Assessment & Plan Note (Signed)
Zio 2 week wear revealed SVT; none of which were sustained ?Patient previously educated and advised of triggers-- smoking, ETOH, anxiety, stress, caffeine, sleep deficit, etc.  ?Does not wish to be on beta blocker to assist ?

## 2021-07-26 NOTE — Assessment & Plan Note (Addendum)
Acute concern, will start Abx ?encouraged to use warm compress ?No lymph involvement ?Discuss RTC protocol if not improved  ?Will check CBC ?ROM deficit d/t slight edema from superficial infection ? ? ? ?

## 2021-07-26 NOTE — Assessment & Plan Note (Signed)
3-5 minute discuss regarding risks of tobacco/nicotine use and recommendations on ways to reduce use and work towards cessation of use of tobacco/nicotine products. Encouraged to use 1-800-QUIT-NOW. ? ?

## 2021-07-26 NOTE — Assessment & Plan Note (Signed)
Will screen with PSA ?Defer DRE ?

## 2021-07-27 LAB — COMPREHENSIVE METABOLIC PANEL
ALT: 20 IU/L (ref 0–44)
AST: 48 IU/L — ABNORMAL HIGH (ref 0–40)
Albumin/Globulin Ratio: 2.3 — ABNORMAL HIGH (ref 1.2–2.2)
Albumin: 4.2 g/dL (ref 3.8–4.8)
Alkaline Phosphatase: 128 IU/L — ABNORMAL HIGH (ref 44–121)
BUN/Creatinine Ratio: 12 (ref 10–24)
BUN: 12 mg/dL (ref 8–27)
Bilirubin Total: 0.3 mg/dL (ref 0.0–1.2)
CO2: 25 mmol/L (ref 20–29)
Calcium: 8.9 mg/dL (ref 8.6–10.2)
Chloride: 101 mmol/L (ref 96–106)
Creatinine, Ser: 1.04 mg/dL (ref 0.76–1.27)
Globulin, Total: 1.8 g/dL (ref 1.5–4.5)
Glucose: 90 mg/dL (ref 70–99)
Potassium: 4.2 mmol/L (ref 3.5–5.2)
Sodium: 140 mmol/L (ref 134–144)
Total Protein: 6 g/dL (ref 6.0–8.5)
eGFR: 79 mL/min/{1.73_m2} (ref >59)

## 2021-07-27 LAB — CBC WITH DIFFERENTIAL/PLATELET
Basophils Absolute: 0.1 10*3/uL (ref 0.0–0.2)
Basos: 1 %
EOS (ABSOLUTE): 0.1 10*3/uL (ref 0.0–0.4)
Eos: 2 %
Hematocrit: 44.6 % (ref 37.5–51.0)
Hemoglobin: 15.5 g/dL (ref 13.0–17.7)
Immature Grans (Abs): 0 10*3/uL (ref 0.0–0.1)
Immature Granulocytes: 0 %
Lymphocytes Absolute: 1.6 10*3/uL (ref 0.7–3.1)
Lymphs: 25 %
MCH: 34.4 pg — ABNORMAL HIGH (ref 26.6–33.0)
MCHC: 34.8 g/dL (ref 31.5–35.7)
MCV: 99 fL — ABNORMAL HIGH (ref 79–97)
Monocytes Absolute: 0.5 10*3/uL (ref 0.1–0.9)
Monocytes: 8 %
Neutrophils Absolute: 4 10*3/uL (ref 1.4–7.0)
Neutrophils: 64 %
Platelets: 228 10*3/uL (ref 150–450)
RBC: 4.5 x10E6/uL (ref 4.14–5.80)
RDW: 12.3 % (ref 11.6–15.4)
WBC: 6.4 10*3/uL (ref 3.4–10.8)

## 2021-07-27 LAB — TSH+FREE T4
Free T4: 1.17 ng/dL (ref 0.82–1.77)
TSH: 1.77 u[IU]/mL (ref 0.450–4.500)

## 2021-07-27 LAB — PSA: Prostate Specific Ag, Serum: 0.3 ng/mL (ref 0.0–4.0)

## 2021-07-27 LAB — LIPID PANEL
Chol/HDL Ratio: 5.3 ratio — ABNORMAL HIGH (ref 0.0–5.0)
Cholesterol, Total: 185 mg/dL (ref 100–199)
HDL: 35 mg/dL — ABNORMAL LOW (ref >39)
LDL Chol Calc (NIH): 125 mg/dL — ABNORMAL HIGH (ref 0–99)
Triglycerides: 138 mg/dL (ref 0–149)
VLDL Cholesterol Cal: 25 mg/dL (ref 5–40)

## 2021-07-27 LAB — HEMOGLOBIN A1C
Est. average glucose Bld gHb Est-mCnc: 103 mg/dL
Hgb A1c MFr Bld: 5.2 % (ref 4.8–5.6)

## 2021-07-27 LAB — VITAMIN D 25 HYDROXY (VIT D DEFICIENCY, FRACTURES): Vit D, 25-Hydroxy: 31.2 ng/mL (ref 30.0–100.0)

## 2021-07-30 ENCOUNTER — Telehealth: Payer: Self-pay

## 2021-07-30 NOTE — Telephone Encounter (Signed)
Copied from [place] [phone]. Topic: General - Other ?>> Jul 30, 2021  2:15 PM Camille Bal, [place] wrote: ?Reason for UYW:[id number] of returning cll from [facility] , about med they want pt to be started on, not sure of name of it. Please call back ?

## 2021-08-13 DIAGNOSIS — R69 Illness, unspecified: Secondary | ICD-10-CM | POA: Diagnosis not present

## 2021-08-13 DIAGNOSIS — Z6827 Body mass index (BMI) 27.0-27.9, adult: Secondary | ICD-10-CM | POA: Diagnosis not present

## 2021-08-13 DIAGNOSIS — Z8249 Family history of ischemic heart disease and other diseases of the circulatory system: Secondary | ICD-10-CM | POA: Diagnosis not present

## 2021-08-13 DIAGNOSIS — E663 Overweight: Secondary | ICD-10-CM | POA: Diagnosis not present

## 2021-08-13 DIAGNOSIS — R030 Elevated blood-pressure reading, without diagnosis of hypertension: Secondary | ICD-10-CM | POA: Diagnosis not present

## 2021-08-13 DIAGNOSIS — N529 Male erectile dysfunction, unspecified: Secondary | ICD-10-CM | POA: Diagnosis not present

## 2021-08-13 DIAGNOSIS — G4733 Obstructive sleep apnea (adult) (pediatric): Secondary | ICD-10-CM | POA: Diagnosis not present

## 2021-08-23 ENCOUNTER — Telehealth: Payer: Self-pay

## 2021-08-23 NOTE — Telephone Encounter (Signed)
Left VM and call back number for patient to call for LDCT appt ?

## 2021-08-26 DIAGNOSIS — X32XXXA Exposure to sunlight, initial encounter: Secondary | ICD-10-CM | POA: Diagnosis not present

## 2021-08-26 DIAGNOSIS — D2271 Melanocytic nevi of right lower limb, including hip: Secondary | ICD-10-CM | POA: Diagnosis not present

## 2021-08-26 DIAGNOSIS — L821 Other seborrheic keratosis: Secondary | ICD-10-CM | POA: Diagnosis not present

## 2021-08-26 DIAGNOSIS — D225 Melanocytic nevi of trunk: Secondary | ICD-10-CM | POA: Diagnosis not present

## 2021-08-26 DIAGNOSIS — L570 Actinic keratosis: Secondary | ICD-10-CM | POA: Diagnosis not present

## 2021-08-26 DIAGNOSIS — D2261 Melanocytic nevi of right upper limb, including shoulder: Secondary | ICD-10-CM | POA: Diagnosis not present

## 2021-08-26 DIAGNOSIS — D2262 Melanocytic nevi of left upper limb, including shoulder: Secondary | ICD-10-CM | POA: Diagnosis not present

## 2021-08-26 DIAGNOSIS — D2272 Melanocytic nevi of left lower limb, including hip: Secondary | ICD-10-CM | POA: Diagnosis not present

## 2021-08-27 ENCOUNTER — Telehealth: Payer: Self-pay | Admitting: *Deleted

## 2021-08-27 NOTE — Telephone Encounter (Signed)
LMTC to schedule Yearly Lung CA CT Scan. 

## 2021-09-02 ENCOUNTER — Other Ambulatory Visit: Payer: Self-pay | Admitting: Family Medicine

## 2021-09-16 ENCOUNTER — Other Ambulatory Visit: Payer: Self-pay | Admitting: *Deleted

## 2021-09-16 DIAGNOSIS — Z122 Encounter for screening for malignant neoplasm of respiratory organs: Secondary | ICD-10-CM

## 2021-09-16 DIAGNOSIS — F17210 Nicotine dependence, cigarettes, uncomplicated: Secondary | ICD-10-CM

## 2021-09-16 DIAGNOSIS — Z87891 Personal history of nicotine dependence: Secondary | ICD-10-CM

## 2021-09-25 ENCOUNTER — Ambulatory Visit
Admission: RE | Admit: 2021-09-25 | Discharge: 2021-09-25 | Disposition: A | Discharge status: Home / Self Care | Payer: Medicare HMO | Source: Ambulatory Visit | Attending: Acute Care | Admitting: Acute Care

## 2021-09-25 ENCOUNTER — Other Ambulatory Visit: Payer: Self-pay

## 2021-09-25 DIAGNOSIS — F17210 Nicotine dependence, cigarettes, uncomplicated: Secondary | ICD-10-CM | POA: Insufficient documentation

## 2021-09-25 DIAGNOSIS — Z122 Encounter for screening for malignant neoplasm of respiratory organs: Secondary | ICD-10-CM | POA: Diagnosis not present

## 2021-09-25 DIAGNOSIS — Z87891 Personal history of nicotine dependence: Secondary | ICD-10-CM | POA: Diagnosis not present

## 2021-09-25 DIAGNOSIS — R69 Illness, unspecified: Secondary | ICD-10-CM | POA: Diagnosis not present

## 2021-09-27 ENCOUNTER — Telehealth: Payer: Self-pay | Admitting: Acute Care

## 2021-09-27 DIAGNOSIS — R911 Solitary pulmonary nodule: Secondary | ICD-10-CM

## 2021-09-27 DIAGNOSIS — Z87891 Personal history of nicotine dependence: Secondary | ICD-10-CM

## 2021-09-27 DIAGNOSIS — F17210 Nicotine dependence, cigarettes, uncomplicated: Secondary | ICD-10-CM

## 2021-09-27 NOTE — Telephone Encounter (Signed)
This is an FYI for the provider.  °

## 2021-09-27 NOTE — Telephone Encounter (Signed)
Colletta Maryland would like for patient to be scheduled with [name] or Dr. Valeta Harms instead of Dr. Patsey Berthold. Colletta Maryland phone number is [phone]. ?

## 2021-09-27 NOTE — Telephone Encounter (Signed)
Lung Screening CT scan 09/25/21:  ?IMPRESSION: ?1. Part solid nodule of the right upper lobe with new solid ?component measuring 4.6 mm. Lung-RADS 4B, suspicious. Additional ?imaging evaluation or consultation with Pulmonology or Thoracic ?Surgery recommended. ?2. Aortic Atherosclerosis (ICD10-I70.0) and Emphysema (ICD10-J43.9). ? ?I have reviewed CT results with Dr Patsey Berthold. She recommends a 3 mth nodule f/u CT and an appt to see her in the office to evaluate and discuss CT results. I have left pt a message at both home and mobile numbers to call back to discuss.  ?

## 2021-09-27 NOTE — Telephone Encounter (Signed)
ok 

## 2021-09-27 NOTE — Telephone Encounter (Signed)
Spoke with pt and reviewed CT results with him. I let him know that there was a nodule that has increased in size since his last CT scan and that Dr Patsey Berthold has recommended a 3 mth nodule f/u CT scan. Also explained to pt that he would be getting a call to set up an appt to See Dr Patsey Berthold in the office. Pt verbalized understanding and had no further questionss. Order has been placed for 3 mth f/u CT scan.  ?

## 2021-09-27 NOTE — Telephone Encounter (Signed)
Please advise if ok to switch to Dr. Valeta Harms or [name] from Dr. Patsey Berthold?  ?

## 2021-10-01 NOTE — Telephone Encounter (Signed)
Left message for Colletta Maryland, pt's daughter and DPR to call back regarding changing f/u appt to Dr Lamonte Sakai or Dr Valeta Harms.  ?

## 2021-10-01 NOTE — Telephone Encounter (Signed)
Spoke with pt's daughter , Colletta Maryland [facility]) and scheduled pt to see Dr Lamonte Sakai 10/10/21 9:30. She verbalized understanding and had no further questions.  ?

## 2021-10-10 ENCOUNTER — Ambulatory Visit: Payer: Medicare HMO | Admitting: Emergency Medicine

## 2021-10-10 ENCOUNTER — Encounter: Payer: Self-pay | Admitting: Emergency Medicine

## 2021-10-10 VITALS — BP 136/74 | HR 58 | Temp 99.4°F | Ht 72.0 in | Wt 206.0 lb

## 2021-10-10 DIAGNOSIS — R911 Solitary pulmonary nodule: Secondary | ICD-10-CM | POA: Diagnosis not present

## 2021-10-10 NOTE — Progress Notes (Signed)
Subjective:    Patient ID: Thomas Cross, male    DOB: 1956-04-23, 66 y.o.   MRN: [id number]  HPI 66 year old active smoker (68 pack years) with a history of GERD, OSA.  He participates in the lung cancer screening program and is here to review an abnormal CT chest. He is not currently on inhaled medication, although he has been tried on albuterol before in the past.   Lung cancer screening CT 09/25/2021 reviewed by me shows no mediastinal or hilar adenopathy.  There is a right upper lobe part solid 11.6 mm nodule with a 4.6 mm solid component that has enlarged from 5 mm on his previous scan.  PFT 12/19/19 reviewed by me shows moderate obstruction, FEV1 69% predicted.    Review of Systems As per HPi  Past Medical History:  Diagnosis Date   Arthritis    GERD (gastroesophageal reflux disease)    RARE-TUMS   Sleep apnea      Family History  Problem Relation Age of Onset   Heart disease Mother    Kidney cancer Mother    Bladder Cancer Mother    Arthritis Mother    CVA Father    Prostate cancer Father    Dementia Father    Hypertension Father    Heart attack Father    Heart attack Brother    Hyperlipidemia Brother    Heart disease Maternal Grandfather    Stroke Paternal Grandmother    Colon cancer Paternal Grandfather    Hyperlipidemia Sister    Hyperlipidemia Brother    Hyperlipidemia Brother    Hyperlipidemia Brother      Social History   Socioeconomic History   Marital status: Married    Spouse name: Not on file   Number of children: 2   Years of education: Not on file   Highest education level: High school graduate  Occupational History   Occupation: full time / Financial controller  Tobacco Use   Smoking status: Every Day    Packs/day: 1.50    Years: 45.00    Pack years: 67.50    Types: Cigarettes   Smokeless tobacco: Never   Tobacco comments:    1.5 - 2 packs or cigarettes smoked daily [facility] 10/10/21  Vaping Use   Vaping Use: Never used  Substance and Sexual Activity    Alcohol use: Yes    Alcohol/week: 14.0 standard drinks    Types: 14 Shots of liquor per week    Comment: 2 MIXED DRINKS EVERY DAY   Drug use: No   Sexual activity: Not on file  Other Topics Concern   Not on file  Social History Narrative   Not on file   Social Determinants of Health   Financial Resource Strain: Low Risk    Difficulty of Paying Living Expenses: Not hard at all  Food Insecurity: No Food Insecurity   Worried About Charity fundraiser in the Last Year: Never true   Ran Out of Food in the Last Year: Never true  Transportation Needs: No Transportation Needs   Lack of Transportation (Medical): No   Lack of Transportation (Non-Medical): No  Physical Activity: Insufficiently Active   Days of Exercise per Week: 3 days   Minutes of Exercise per Session: 30 min  Stress: No Stress Concern Present   Feeling of Stress : Only a little  Social Connections: Engineer, building services of Communication with Friends and Family: More than three times a week   Frequency of Social Gatherings  with Friends and Family: More than three times a week   Attends Religious Services: More than 4 times per year   Active Member of Clubs or Organizations: Yes   Attends Music therapist: More than 4 times per year   Marital Status: Married  Human resources officer Violence: Not At Risk   Fear of Current or Ex-Partner: No   Emotionally Abused: No   Physically Abused: No   Sexually Abused: No      No Known Allergies   Outpatient Medications Prior to Visit  Medication Sig Dispense Refill   aspirin EC 81 MG tablet Take 81 mg by mouth every evening.     calcium carbonate (TUMS - DOSED IN MG ELEMENTAL CALCIUM) 500 MG chewable tablet Chew 1 tablet by mouth as needed for indigestion or heartburn.     Omega-3 Fatty Acids (FISH OIL) 1000 MG CPDR Take 1 capsule by mouth every evening.      cephALEXin (KEFLEX) 500 MG capsule Take 1 capsule (500 mg total) by mouth 2 (two) times daily. 20  capsule 0   Cholecalciferol 1000 units capsule Take 1,000 Units by mouth every evening.  (Patient not taking: Reported on 10/10/2021)     Multiple Vitamins-Minerals (MULTIVITAMIN WITH MINERALS) tablet Take 1 tablet by mouth every evening. (Patient not taking: Reported on 10/10/2021)     sildenafil (REVATIO) 20 MG tablet Take 1-2 tablets by mouth 1-4 hours prior to intercourse. (Patient not taking: Reported on 10/10/2021) 30 tablet 0   Vitamins-Lipotropics (LIPOFLAVONOID) TABS Take 1 tablet by mouth every evening.  (Patient not taking: Reported on 10/10/2021)     No facility-administered medications prior to visit.        Objective:   Physical Exam  Vitals:   10/10/21 0947  BP: 136/74  Pulse: (!) 58  Temp: 99.4 F (37.4 C)  TempSrc: Oral  SpO2: 98%  Weight: 206 lb (93.4 kg)  Height: 6' (1.829 m)   Gen: Pleasant, well-nourished, in no distress,  normal affect  ENT: No lesions,  mouth clear,  oropharynx clear, no postnasal drip  Neck: No JVD, no stridor  Lungs: No use of accessory muscles, no crackles or wheezing on normal respiration, no wheeze on forced expiration  Cardiovascular: RRR, heart sounds normal, no murmur or gallops, no peripheral edema  Musculoskeletal: No deformities, no cyanosis or clubbing  Neuro: alert, awake, non focal  Skin: Warm, no lesions or rash     Assessment & Plan:  Pulmonary nodule 11.6 mm right upper lobe nodule, groundglass with some solid component, detected on lung cancer screening program scan in a patient who is high risk for malignancy.  Talked him today about the options for next steps including possible bronchoscopy, possible referral for surgical resection.  He would need a PET scan prior to surgical referral.  His PFT from 12/2019 showed moderate obstruction with an FEV1 69% predicted so he will probably be a candidate.  May be reasonable to pursue bronchoscopy with and get a tissue diagnosis before committing to surgery.  We agreed to repeat  a CT chest/PET scan in August to assess after 3 months.  At that time we will decide whether to follow, proceed with either bronchoscopy or referral to thoracic surgery.  We reviewed your CT scans of the chest today. We also reviewed your pulmonary function testing from 2021 We will arrange for a combined PET scan, CT scan of the chest to be done in August 2023. Follow [name] in August after your  scan so we can review the results, plan next steps in your evaluation.   Baltazar Apo, MD, PhD 10/10/2021, 3:17 PM [facility] [phone] or if no answer before 7:00PM call [phone] For any issues after 7:00PM please call eLink [phone]

## 2021-10-10 NOTE — Patient Instructions (Addendum)
We reviewed your CT scans of the chest today. We also reviewed your pulmonary function testing from 2021 We will arrange for a combined PET scan, CT scan of the chest to be done in August 2023. Follow [name] in August after your scan so we can review the results, plan next steps in your evaluation.

## 2021-10-10 NOTE — Assessment & Plan Note (Signed)
11.6 mm right upper lobe nodule, groundglass with some solid component, detected on lung cancer screening program scan in a patient who is high risk for malignancy.  Talked him today about the options for next steps including possible bronchoscopy, possible referral for surgical resection.  He would need a PET scan prior to surgical referral.  His PFT from 12/2019 showed moderate obstruction with an FEV1 69% predicted so he will probably be a candidate.  May be reasonable to pursue bronchoscopy with and get a tissue diagnosis before committing to surgery.  We agreed to repeat a CT chest/PET scan in August to assess after 3 months.  At that time we will decide whether to follow, proceed with either bronchoscopy or referral to thoracic surgery.  We reviewed your CT scans of the chest today. We also reviewed your pulmonary function testing from 2021 We will arrange for a combined PET scan, CT scan of the chest to be done in August 2023. Follow [name] in August after your scan so we can review the results, plan next steps in your evaluation.

## 2021-10-25 ENCOUNTER — Telehealth: Payer: Self-pay

## 2021-10-25 ENCOUNTER — Encounter: Payer: Medicare HMO | Admitting: Physician Assistant

## 2021-10-25 NOTE — Telephone Encounter (Signed)
Copied from [place] [id number]. Topic: General - Other >> Oct 25, 2021  8:13 AM Charlotte Sanes J wrote: Reason for CRM: Dr. Aura Fey) sent the office an order to have a CT /PET combo scan / pts daughter stated she hasnt heard from office about this / please advise

## 2021-10-29 ENCOUNTER — Encounter: Payer: Self-pay | Admitting: Emergency Medicine

## 2021-10-31 ENCOUNTER — Ambulatory Visit (INDEPENDENT_AMBULATORY_CARE_PROVIDER_SITE_OTHER): Payer: Medicare HMO | Admitting: Physician Assistant

## 2021-10-31 ENCOUNTER — Encounter: Payer: Self-pay | Admitting: Physician Assistant

## 2021-10-31 VITALS — BP 116/81 | HR 66 | Ht 72.0 in | Wt 206.0 lb

## 2021-10-31 DIAGNOSIS — F17210 Nicotine dependence, cigarettes, uncomplicated: Secondary | ICD-10-CM | POA: Diagnosis not present

## 2021-10-31 DIAGNOSIS — R69 Illness, unspecified: Secondary | ICD-10-CM | POA: Diagnosis not present

## 2021-10-31 DIAGNOSIS — Z0000 Encounter for general adult medical examination without abnormal findings: Secondary | ICD-10-CM

## 2021-10-31 MED ORDER — ATORVASTATIN CALCIUM 10 MG PO TABS: 10.0000 mg | ORAL_TABLET | Freq: Every day | ORAL | 3 refills | Status: DC

## 2021-10-31 NOTE — Progress Notes (Signed)
I,[name],acting as a Education administrator for [employer], PA-C.,have documented all relevant documentation on the behalf of Thomas Kirschner, PA-C,as directed by  Thomas Kirschner, PA-C while in the presence of Thomas Kirschner, PA-C.   Complete physical exam   Patient: Thomas Cross   DOB: [birth year]   66 y.o. Male  MRN: [id number] Visit Date: 10/31/2021  Today's healthcare provider: [name], PA-C   Cc. cpe  Subjective    [name] is a 66 y.o. male who presents today for a complete physical exam.  He reports consuming a general diet. The patient has a physically strenuous job, but has no regular exercise apart from work.  He generally feels well. He reports sleeping well. He does not have additional problems to discuss today.    Past Medical History:  Diagnosis Date   Arthritis    GERD (gastroesophageal reflux disease)    RARE-TUMS   Sleep apnea    Past Surgical History:  Procedure Laterality Date   COLONOSCOPY  2009   COLONOSCOPY WITH PROPOFOL N/A 07/27/2020   Procedure: COLONOSCOPY WITH PROPOFOL;  Surgeon: Jonathon Bellows, MD;  Location: [facility] ENDOSCOPY;  Service: Gastroenterology;  Laterality: N/A;   HERNIA REPAIR  30 years ago   right inguinal   INGUINAL HERNIA REPAIR Right 05/20/2016   Procedure: LAPAROSCOPIC INGUINAL HERNIA;  Surgeon: Christene Lye, MD;  Location: [facility] ORS;  Service: General;  Laterality: Right;   TONSILLECTOMY AND ADENOIDECTOMY     Social History   Socioeconomic History   Marital status: Married    Spouse name: Not on file   Number of children: 2   Years of education: Not on file   Highest education level: High school graduate  Occupational History   Occupation: full time / owner  Tobacco Use   Smoking status: Every Day    Packs/day: 1.50    Years: 45.00    Total pack years: 67.50    Types: Cigarettes   Smokeless tobacco: Never   Tobacco comments:    1.5 - 2 packs or cigarettes smoked daily [facility] 10/10/21  Vaping Use   Vaping  Use: Never used  Substance and Sexual Activity   Alcohol use: Yes    Alcohol/week: 14.0 standard drinks of alcohol    Types: 14 Shots of liquor per week    Comment: 2 MIXED DRINKS EVERY DAY   Drug use: No   Sexual activity: Not on file  Other Topics Concern   Not on file  Social History Narrative   Not on file   Social Determinants of Health   Financial Resource Strain: Low Risk  (07/01/2021)   Overall Financial Resource Strain (CARDIA)    Difficulty of Paying Living Expenses: Not hard at all  Food Insecurity: No Food Insecurity (07/01/2021)   Hunger Vital Sign    Worried About Running Out of Food in the Last Year: Never true    Ran Out of Food in the Last Year: Never true  Transportation Needs: No Transportation Needs (07/01/2021)   PRAPARE - Hydrologist (Medical): No    Lack of Transportation (Non-Medical): No  Physical Activity: Insufficiently Active (07/01/2021)   Exercise Vital Sign    Days of Exercise per Week: 3 days    Minutes of Exercise per Session: 30 min  Stress: No Stress Concern Present (07/01/2021)   [place]    Feeling of Stress : Only a little  Social Connections:  Socially Integrated (07/01/2021)   Social Connection and Isolation Panel [NHANES]    Frequency of Communication with Friends and Family: More than three times a week    Frequency of Social Gatherings with Friends and Family: More than three times a week    Attends Religious Services: More than 4 times per year    Active Member of Clubs or Organizations: Yes    Attends Archivist Meetings: More than 4 times per year    Marital Status: Married  Human resources officer Violence: Not At Risk (07/01/2021)   Humiliation, Afraid, Rape, and Kick questionnaire    Fear of Current or Ex-Partner: No    Emotionally Abused: No    Physically Abused: No    Sexually Abused: No   Family Status  Relation Name Status    Mother  Deceased   Father  Deceased   Brother 1 Deceased   MGF  Deceased   PGM  Deceased   PGF  Deceased   Sister  (Not Specified)   Brother 2 (Not Specified)   Brother 68 (Not Specified)   Brother 31 (Not Specified)   Family History  Problem Relation Age of Onset   Heart disease Mother    Kidney cancer Mother    Bladder Cancer Mother    Arthritis Mother    CVA Father    Prostate cancer Father    Dementia Father    Hypertension Father    Heart attack Father    Heart attack Brother    Hyperlipidemia Brother    Heart disease Maternal Grandfather    Stroke Paternal Grandmother    Colon cancer Paternal Grandfather    Hyperlipidemia Sister    Hyperlipidemia Brother    Hyperlipidemia Brother    Hyperlipidemia Brother    No Known Allergies  Patient Care Team: Thomas Kirschner, PA-C as PCP - General (Physician Assistant) Kate Sable, MD as PCP - Cardiology (Cardiology) Thelma Comp, OD (Optometry)   Medications: Outpatient Medications Prior to Visit  Medication Sig   aspirin EC 81 MG tablet Take 81 mg by mouth every evening.   calcium carbonate (TUMS - DOSED IN MG ELEMENTAL CALCIUM) 500 MG chewable tablet Chew 1 tablet by mouth as needed for indigestion or heartburn.   Cholecalciferol 1000 units capsule Take 1,000 Units by mouth every evening.   Multiple Vitamins-Minerals (MULTIVITAMIN WITH MINERALS) tablet Take 1 tablet by mouth every evening.   Omega-3 Fatty Acids (FISH OIL) 1000 MG CPDR Take 1 capsule by mouth every evening.    sildenafil (REVATIO) 20 MG tablet Take 1-2 tablets by mouth 1-4 hours prior to intercourse.   Vitamins-Lipotropics (LIPOFLAVONOID) TABS Take 1 tablet by mouth every evening.   [DISCONTINUED] cephALEXin (KEFLEX) 500 MG capsule Take 1 capsule (500 mg total) by mouth 2 (two) times daily.   No facility-administered medications prior to visit.    Review of Systems  Constitutional:  Positive for fatigue.  HENT:  Positive for tinnitus.    Respiratory:  Positive for cough and wheezing.   Genitourinary:  Positive for frequency.  Musculoskeletal:  Positive for arthralgias, back pain and neck stiffness.  Hematological:  Bruises/bleeds easily.    Objective    Blood pressure 116/81, pulse 66, height 6' (1.829 m), weight 206 lb (93.4 kg), SpO2 99 %.    Physical Exam Constitutional:      General: He is awake.     Appearance: He is well-developed.  HENT:     Head: Normocephalic.     Right Ear: Tympanic  membrane, ear canal and external ear normal.     Left Ear: Tympanic membrane, ear canal and external ear normal.     Nose: Nose normal. No congestion or rhinorrhea.     Mouth/Throat:     Mouth: Mucous membranes are moist.     Pharynx: No oropharyngeal exudate or posterior oropharyngeal erythema.  Eyes:     Pupils: Pupils are equal, round, and reactive to light.  Cardiovascular:     Rate and Rhythm: Normal rate and regular rhythm.     Heart sounds: Normal heart sounds.  Pulmonary:     Effort: Pulmonary effort is normal.     Breath sounds: Normal breath sounds.  Abdominal:     General: There is no distension.     Palpations: Abdomen is soft.     Tenderness: There is no abdominal tenderness. There is no guarding.  Musculoskeletal:     Cervical back: Normal range of motion.     Right lower leg: No edema.     Left lower leg: No edema.  Lymphadenopathy:     Cervical: Bilateral enlarged, firm, but mobile tonsillar adenopathy. No other cervical chain nodes enlarged. Skin:    General: Skin is warm.  Neurological:     Mental Status: He is alert and oriented to person, place, and time.  Psychiatric:        Attention and Perception: Attention normal.        Mood and Affect: Mood normal.        Speech: Speech normal.        Behavior: Behavior normal. Behavior is cooperative.     Last depression screening scores    07/01/2021    1:55 PM 06/13/2021    2:19 PM 09/25/2020    1:33 PM  PHQ 2/9 Scores  PHQ - 2 Score 0 0 0   PHQ- 9 Score 0 5 3   Last fall risk screening    07/01/2021    1:58 PM  [place] in the past year? 0  Number falls in past yr: 0  Injury with Fall? 0  Risk for fall due to : No Fall Risks  Follow up Falls evaluation completed   Last Audit-C alcohol use screening    07/01/2021    1:55 PM  Alcohol Use Disorder Test (AUDIT)  1. How often do you have a drink containing alcohol? 4  2. How many drinks containing alcohol do you have on a typical day when you are drinking? 0  3. How often do you have six or more drinks on one occasion? 0  AUDIT-C Score 4  4. How often during the last year have you found that you were not able to stop drinking once you had started? 0  5. How often during the last year have you failed to do what was normally expected from you because of drinking? 0  6. How often during the last year have you needed a first drink in the morning to get yourself going after a heavy drinking session? 0  7. How often during the last year have you had a feeling of guilt of remorse after drinking? 0  8. How often during the last year have you been unable to remember what happened the night before because you had been drinking? 0  9. Have you or someone else been injured as a result of your drinking? 0  10. Has a relative or friend or a doctor or another health worker been concerned  about your drinking or suggested you cut down? 0  Alcohol Use Disorder Identification Test Final Score (AUDIT) 4   A score of 3 or more in women, and 4 or more in men indicates increased risk for alcohol abuse, EXCEPT if all of the points are from question 1   No results found for any visits on 10/31/21.  Assessment & Plan    Routine Health Maintenance and Physical Exam  Exercise Activities and Dietary recommendations  Goals      DIET - EAT MORE FRUITS AND VEGETABLES       Immunization History  Administered Date(s) Administered   Pneumococcal Polysaccharide-23 11/08/2019   Tdap  03/18/2011    Health Maintenance  Topic Date Due   Pneumonia Vaccine 90+ Years old (2 - PCV) 11/07/2020   COVID-19 Vaccine (1) 11/16/2021 (Originally 04/05/1956)   Zoster Vaccines- Shingrix (1 of 2) 01/31/2022 (Originally 10/04/2003)   TETANUS/TDAP  11/01/2022 (Originally 03/17/2021)   INFLUENZA VACCINE  12/17/2021   COLONOSCOPY (Pts 45-46yr Insurance coverage will need to be confirmed)  07/28/2027   Hepatitis C Screening  Completed   HPV VACCINES  Aged Out    Discussed health benefits of physical activity, and encouraged him to engage in regular exercise appropriate for his age and condition.  Problem List Items Addressed This Visit       Other   Smoking greater than 30 pack years    The 10-year ASCVD risk score (Arnett DK, et al., 2019) is: 19.4%  Last lipid panel with mild hyperlipidemia.  Discussed starting statin for cardioprotection. Pt is hesitant d/t se will willing to try, rx lipitor 10 mg daily. F/u 6 mo   Last LDCT saw enlarging nodule, pt scheduled for PET scan      Relevant Medications   atorvastatin (LIPITOR) 10 MG tablet   Other Visit Diagnoses     Encounter for annual wellness exam in Medicare patient    -  Primary      Reviewed last cmp, cbc, lipid panel.  Return in about 6 months (around 05/02/2022) for hyperlipidemia.     I, LMikey Kirschner PA-C have reviewed all documentation for this visit. The documentation on  10/31/2021  for the exam, diagnosis, procedures, and orders are all accurate and complete.  LMikey Kirschner PA-C BR[facility] [place] [id number]: [phone]: [phone]

## 2021-10-31 NOTE — Progress Notes (Deleted)
I,Thomas Cross,acting as a Education administrator for [employer], PA-C.,have documented all relevant documentation on the behalf of Thomas Kirschner, PA-C,as directed by  Thomas Kirschner, PA-C while in the presence of Thomas Kirschner, PA-C.   Complete physical exam   Patient: Thomas Cross   DOB: 1956-03-22   66 y.o. Male  MRN: [id number] Visit Date: 10/31/2021  Today's healthcare provider: [name], PA-C   No chief complaint on file.  Subjective    Thomas Cross is a 66 y.o. male who presents today for a complete physical exam.  He reports consuming a general diet. The patient has a physically strenuous job, but has no regular exercise apart from work.  He generally feels well. He reports sleeping well. He does not have additional problems to discuss today.  HPI  Reports spot on his lungs that has grown bigger discovered during his last ct that he gets once a year.  Past Medical History:  Diagnosis Date   Arthritis    GERD (gastroesophageal reflux disease)    RARE-TUMS   Sleep apnea    Past Surgical History:  Procedure Laterality Date   COLONOSCOPY  2009   COLONOSCOPY WITH PROPOFOL N/A 07/27/2020   Procedure: COLONOSCOPY WITH PROPOFOL;  Surgeon: Thomas Bellows, MD;  Location: [facility];  Service: Gastroenterology;  Laterality: N/A;   HERNIA REPAIR  30 years ago   right inguinal   INGUINAL HERNIA REPAIR Right 05/20/2016   Procedure: LAPAROSCOPIC INGUINAL HERNIA;  Surgeon: Thomas Lye, MD;  Location: [facility];  Service: General;  Laterality: Right;   TONSILLECTOMY AND ADENOIDECTOMY     Social History   Socioeconomic History   Marital status: Married    Spouse name: Not on file   Number of children: 2   Years of education: Not on file   Highest education level: High school graduate  Occupational History   Occupation: full time / owner  Tobacco Use   Smoking status: Every Day    Packs/day: 1.50    Years: 45.00    Total pack years: 67.50    Types: Cigarettes    Smokeless tobacco: Never   Tobacco comments:    1.5 - 2 packs or cigarettes smoked daily [facility] 10/10/21  Vaping Use   Vaping Use: Never used  Substance and Sexual Activity   Alcohol use: Yes    Alcohol/week: 14.0 standard drinks of alcohol    Types: 14 Shots of liquor per week    Comment: 2 MIXED DRINKS EVERY DAY   Drug use: No   Sexual activity: Not on file  Other Topics Concern   Not on file  Social History Narrative   Not on file   Social Determinants of Health   Financial Resource Strain: Low Risk  (07/01/2021)   Overall Financial Resource Strain (CARDIA)    Difficulty of Paying Living Expenses: Not hard at all  Food Insecurity: No Food Insecurity (07/01/2021)   Hunger Vital Sign    Worried About Running Out of Food in the Last Year: Never true    Ran Out of Food in the Last Year: Never true  Transportation Needs: No Transportation Needs (07/01/2021)   PRAPARE - Hydrologist (Medical): No    Lack of Transportation (Non-Medical): No  Physical Activity: Insufficiently Active (07/01/2021)   Exercise Vital Sign    Days of Exercise per Week: 3 days    Minutes of Exercise per Session: 30 min  Stress: No Stress Concern Present (07/01/2021)  [facility]    Feeling of Stress : Only a little  Social Connections: Socially Integrated (07/01/2021)   Social Connection and Isolation Panel [NHANES]    Frequency of Communication with Friends and Family: More than three times a week    Frequency of Social Gatherings with Friends and Family: More than three times a week    Attends Religious Services: More than 4 times per year    Active Member of Clubs or Organizations: Yes    Attends Archivist Meetings: More than 4 times per year    Marital Status: Married  Human resources officer Violence: Not At Risk (07/01/2021)   Humiliation, Afraid, Rape, and Kick Cross    Fear of Current or  Ex-Partner: No    Emotionally Abused: No    Physically Abused: No    Sexually Abused: No   Family Status  Relation Name Status   Mother  Deceased   Father  Deceased   Brother 1 Deceased   MGF  Deceased   PGM  Deceased   PGF  Deceased   Sister  (Not Specified)   Brother 2 (Not Specified)   Brother 22 (Not Specified)   Brother 80 (Not Specified)   Family History  Problem Relation Age of Onset   Heart disease Mother    Kidney cancer Mother    Bladder Cancer Mother    Arthritis Mother    CVA Father    Prostate cancer Father    Dementia Father    Hypertension Father    Heart attack Father    Heart attack Brother    Hyperlipidemia Brother    Heart disease Maternal Grandfather    Stroke Paternal Grandmother    Colon cancer Paternal Grandfather    Hyperlipidemia Sister    Hyperlipidemia Brother    Hyperlipidemia Brother    Hyperlipidemia Brother    No Known Allergies  Patient Care Team: Thomas Kirschner, PA-C as PCP - General (Physician Assistant) Kate Sable, MD as PCP - Cardiology (Cardiology) [facility], OD (Optometry)   Medications: Outpatient Medications Prior to Visit  Medication Sig   aspirin EC 81 MG tablet Take 81 mg by mouth every evening.   calcium carbonate (TUMS - DOSED IN MG ELEMENTAL CALCIUM) 500 MG chewable tablet Chew 1 tablet by mouth as needed for indigestion or heartburn.   cephALEXin (KEFLEX) 500 MG capsule Take 1 capsule (500 mg total) by mouth 2 (two) times daily.   Cholecalciferol 1000 units capsule Take 1,000 Units by mouth every evening.  (Patient not taking: Reported on 10/10/2021)   Multiple Vitamins-Minerals (MULTIVITAMIN WITH MINERALS) tablet Take 1 tablet by mouth every evening. (Patient not taking: Reported on 10/10/2021)   Omega-3 Fatty Acids (FISH OIL) 1000 MG CPDR Take 1 capsule by mouth every evening.    sildenafil (REVATIO) 20 MG tablet Take 1-2 tablets by mouth 1-4 hours prior to intercourse. (Patient not taking: Reported on  10/10/2021)   Vitamins-Lipotropics (LIPOFLAVONOID) TABS Take 1 tablet by mouth every evening.  (Patient not taking: Reported on 10/10/2021)   No facility-administered medications prior to visit.    Review of Systems  Constitutional:  Positive for fatigue.  HENT:  Positive for tinnitus.   Respiratory:  Positive for cough and wheezing.   Musculoskeletal:  Positive for arthralgias, back pain and neck stiffness.  Hematological:  Bruises/bleeds easily.    {Labs  Heme  Chem  Endocrine  Serology  Results Review (optional):23779}  Objective  There were no vitals taken for this visit. {Show previous vital signs (optional):23777}   Physical Exam  ***  Last depression screening scores    07/01/2021    1:55 PM 06/13/2021    2:19 PM 09/25/2020    1:33 PM  PHQ 2/9 Scores  PHQ - 2 Score 0 0 0  PHQ- 9 Score 0 5 3   Last fall risk screening    07/01/2021    1:58 PM  [place] in the past year? 0  Number falls in past yr: 0  Injury with Fall? 0  Risk for fall due to : No Fall Risks  Follow up Falls evaluation completed   Last Audit-C alcohol use screening    07/01/2021    1:55 PM  Alcohol Use Disorder Test (AUDIT)  1. How often do you have a drink containing alcohol? 4  2. How many drinks containing alcohol do you have on a typical day when you are drinking? 0  3. How often do you have six or more drinks on one occasion? 0  AUDIT-C Score 4  4. How often during the last year have you found that you were not able to stop drinking once you had started? 0  5. How often during the last year have you failed to do what was normally expected from you because of drinking? 0  6. How often during the last year have you needed a first drink in the morning to get yourself going after a heavy drinking session? 0  7. How often during the last year have you had a feeling of guilt of remorse after drinking? 0  8. How often during the last year have you been unable to remember what  happened the night before because you had been drinking? 0  9. Have you or someone else been injured as a result of your drinking? 0  10. Has a relative or friend or a doctor or another health worker been concerned about your drinking or suggested you cut down? 0  Alcohol Use Disorder Identification Test Final Score (AUDIT) 4   A score of 3 or more in women, and 4 or more in men indicates increased risk for alcohol abuse, EXCEPT if all of the points are from question 1   No results found for any visits on 10/31/21.  Assessment & Plan    Routine Health Maintenance and Physical Exam  Exercise Activities and Dietary recommendations  Goals      DIET - EAT MORE FRUITS AND VEGETABLES        Immunization History  Administered Date(s) Administered   Pneumococcal Polysaccharide-23 11/08/2019   Tdap 03/18/2011    Health Maintenance  Topic Date Due   COVID-19 Vaccine (1) Never done   Zoster Vaccines- Shingrix (1 of 2) Never done   Pneumonia Vaccine 85+ Years old (2 - PCV) 11/07/2020   TETANUS/TDAP  03/17/2021   INFLUENZA VACCINE  12/17/2021   COLONOSCOPY (Pts 45-57yr Insurance coverage will need to be confirmed)  07/28/2027   Hepatitis C Screening  Completed   HPV VACCINES  Aged Out    Discussed health benefits of physical activity, and encouraged him to engage in regular exercise appropriate for his age and condition.  ***  No follow-ups on file.     {provider attestation***:1}   LMikey Kirschner PA-C  [phone](phone) [phone](fax)  [place]

## 2021-10-31 NOTE — Assessment & Plan Note (Addendum)
The 10-year ASCVD risk score (Arnett DK, et al., 2019) is: 19.4%  Last lipid panel with mild hyperlipidemia.  Discussed starting statin for cardioprotection. Pt is hesitant d/t se will willing to try, rx lipitor 10 mg daily. F/u 6 mo   Last LDCT saw enlarging nodule, pt scheduled for PET scan

## 2021-11-20 DIAGNOSIS — D485 Neoplasm of uncertain behavior of skin: Secondary | ICD-10-CM | POA: Diagnosis not present

## 2021-11-20 DIAGNOSIS — L820 Inflamed seborrheic keratosis: Secondary | ICD-10-CM | POA: Diagnosis not present

## 2021-11-20 DIAGNOSIS — R58 Hemorrhage, not elsewhere classified: Secondary | ICD-10-CM | POA: Diagnosis not present

## 2021-12-05 ENCOUNTER — Encounter: Payer: Self-pay | Admitting: Physician Assistant

## 2021-12-09 ENCOUNTER — Encounter (HOSPITAL_COMMUNITY)
Admission: RE | Admit: 2021-12-09 | Discharge: 2021-12-09 | Disposition: A | Discharge status: Home / Self Care | Payer: Medicare HMO | Source: Ambulatory Visit | Attending: Emergency Medicine | Admitting: Emergency Medicine

## 2021-12-09 DIAGNOSIS — R7309 Other abnormal glucose: Secondary | ICD-10-CM | POA: Diagnosis not present

## 2021-12-09 DIAGNOSIS — N3289 Other specified disorders of bladder: Secondary | ICD-10-CM | POA: Diagnosis not present

## 2021-12-09 DIAGNOSIS — R911 Solitary pulmonary nodule: Secondary | ICD-10-CM | POA: Diagnosis not present

## 2021-12-09 DIAGNOSIS — I700 Atherosclerosis of aorta: Secondary | ICD-10-CM | POA: Insufficient documentation

## 2021-12-09 LAB — GLUCOSE, CAPILLARY: Glucose-Capillary: 102 mg/dL — ABNORMAL HIGH (ref 70–99)

## 2021-12-09 MED ORDER — FLUDEOXYGLUCOSE F - 18 (FDG) INJECTION
10.3000 | Freq: Once | INTRAVENOUS | Status: AC
  Administered 2021-12-09: 10.3 via INTRAVENOUS

## 2021-12-09 NOTE — Telephone Encounter (Signed)
Emailed billing to see if we can get one taken off due to our error.

## 2021-12-11 ENCOUNTER — Encounter (HOSPITAL_COMMUNITY)
Admission: RE | Admit: 2021-12-11 | Discharge: 2021-12-11 | Disposition: A | Discharge status: Home / Self Care | Payer: Medicare HMO | Source: Ambulatory Visit | Attending: Emergency Medicine | Admitting: Emergency Medicine

## 2021-12-11 DIAGNOSIS — J439 Emphysema, unspecified: Secondary | ICD-10-CM | POA: Diagnosis not present

## 2021-12-11 DIAGNOSIS — R911 Solitary pulmonary nodule: Secondary | ICD-10-CM | POA: Diagnosis not present

## 2021-12-11 LAB — GLUCOSE, CAPILLARY: Glucose-Capillary: 104 mg/dL — ABNORMAL HIGH (ref 70–99)

## 2021-12-11 MED ORDER — FLUDEOXYGLUCOSE F - 18 (FDG) INJECTION
10.3000 | Freq: Once | INTRAVENOUS | Status: AC
  Administered 2021-12-11: 10.3 via INTRAVENOUS

## 2021-12-20 ENCOUNTER — Encounter: Payer: Self-pay | Admitting: Family Medicine

## 2021-12-20 ENCOUNTER — Ambulatory Visit (INDEPENDENT_AMBULATORY_CARE_PROVIDER_SITE_OTHER): Payer: Medicare HMO | Admitting: Family Medicine

## 2021-12-20 VITALS — BP 125/78 | Temp 98.4°F | Wt 205.0 lb

## 2021-12-20 DIAGNOSIS — R748 Abnormal levels of other serum enzymes: Secondary | ICD-10-CM | POA: Diagnosis not present

## 2021-12-20 DIAGNOSIS — E7800 Pure hypercholesterolemia, unspecified: Secondary | ICD-10-CM | POA: Diagnosis not present

## 2021-12-20 DIAGNOSIS — I700 Atherosclerosis of aorta: Secondary | ICD-10-CM | POA: Insufficient documentation

## 2021-12-20 DIAGNOSIS — N3289 Other specified disorders of bladder: Secondary | ICD-10-CM | POA: Diagnosis not present

## 2021-12-20 NOTE — Progress Notes (Signed)
I,Sulibeya S Dimas,acting as a Education administrator for Gwyneth Sprout, FNP.,have documented all relevant documentation on the behalf of Gwyneth Sprout, FNP,as directed by  Gwyneth Sprout, FNP while in the presence of Gwyneth Sprout, FNP.   Established patient visit   Patient: Thomas Cross   DOB: 1955-10-06   66 y.o. Male  MRN: [id number] Visit Date: 12/20/2021  Today's healthcare provider: Gwyneth Sprout, FNP  Introduced to nurse practitioner role and practice setting.  All questions answered.  Discussed provider/patient relationship and expectations.   Chief Complaint  Patient presents with   Follow-up   Subjective    HPI  Patient here today with his daughter. She is requesting PET results. Per patient he does not have any questions or concerns. Patient is still smoking. Patient refused pneumonia vaccine.   Collene Gobble, MD to Dierdre Highman, RN      12/11/21  3:44 PM Please let the patient know that his CT scan, PET scan from 7/26 both show that his right upper lobe pulmonary nodule is resolving.  Good news. We talk about any future scans at his ROV.  ----------------------------------------------------------------------------------------- Review of PET results repeated s/p review from Pulm. Has appt scheduled.  Medications: Outpatient Medications Prior to Visit  Medication Sig   aspirin EC 81 MG tablet Take 81 mg by mouth every evening.   calcium carbonate (TUMS - DOSED IN MG ELEMENTAL CALCIUM) 500 MG chewable tablet Chew 1 tablet by mouth as needed for indigestion or heartburn.   Omega-3 Fatty Acids (FISH OIL) 1000 MG CPDR Take 1 capsule by mouth every evening.    [DISCONTINUED] atorvastatin (LIPITOR) 10 MG tablet Take 1 tablet (10 mg total) by mouth daily.   [DISCONTINUED] Cholecalciferol 1000 units capsule Take 1,000 Units by mouth every evening.   [DISCONTINUED] Multiple Vitamins-Minerals (MULTIVITAMIN WITH MINERALS) tablet Take 1 tablet by mouth every evening.   [DISCONTINUED]  sildenafil (REVATIO) 20 MG tablet Take 1-2 tablets by mouth 1-4 hours prior to intercourse.   [DISCONTINUED] Vitamins-Lipotropics (LIPOFLAVONOID) TABS Take 1 tablet by mouth every evening.   No facility-administered medications prior to visit.    Review of Systems  Last CBC Lab Results  Component Value Date   WBC 6.4 07/26/2021   HGB 15.5 07/26/2021   HCT 44.6 07/26/2021   MCV 99 (H) 07/26/2021   MCH 34.4 (H) 07/26/2021   RDW 12.3 07/26/2021   PLT 228 20/94/7096   Last metabolic panel Lab Results  Component Value Date   GLUCOSE 90 07/26/2021   NA 140 07/26/2021   K 4.2 07/26/2021   CL 101 07/26/2021   CO2 25 07/26/2021   BUN 12 07/26/2021   CREATININE 1.04 07/26/2021   EGFR 79 07/26/2021   CALCIUM 8.9 07/26/2021   PROT 6.0 07/26/2021   ALBUMIN 4.2 07/26/2021   LABGLOB 1.8 07/26/2021   AGRATIO 2.3 (H) 07/26/2021   BILITOT 0.3 07/26/2021   ALKPHOS 128 (H) 07/26/2021   AST 48 (H) 07/26/2021   ALT 20 07/26/2021   Last lipids Lab Results  Component Value Date   CHOL 185 07/26/2021   HDL 35 (L) 07/26/2021   LDLCALC 125 (H) 07/26/2021   TRIG 138 07/26/2021   CHOLHDL 5.3 (H) 07/26/2021   Last hemoglobin A1c Lab Results  Component Value Date   HGBA1C 5.2 07/26/2021   Last thyroid functions Lab Results  Component Value Date   TSH 1.770 07/26/2021   Last vitamin D Lab Results  Component Value Date  VD25OH 31.2 07/26/2021       Objective    BP 125/78 (BP Location: Right Arm, Patient Position: Sitting, Cuff Size: Large)   Temp 98.4 F (36.9 C) (Oral)   Wt 205 lb (93 kg)   SpO2 98%   BMI 27.80 kg/m   BP Readings from Last 3 Encounters:  12/20/21 125/78  10/31/21 116/81  10/10/21 136/74   Wt Readings from Last 3 Encounters:  12/20/21 205 lb (93 kg)  10/31/21 206 lb (93.4 kg)  10/10/21 206 lb (93.4 kg)      Physical Exam Vitals and nursing note reviewed.  Constitutional:      Appearance: Normal appearance. He is overweight.  HENT:     Head:  Normocephalic and atraumatic.  Eyes:     Pupils: Pupils are equal, round, and reactive to light.  Cardiovascular:     Rate and Rhythm: Normal rate and regular rhythm.     Pulses: Normal pulses.     Heart sounds: Normal heart sounds.  Pulmonary:     Effort: Pulmonary effort is normal.     Breath sounds: Normal breath sounds.  Musculoskeletal:        General: Normal range of motion.     Cervical back: Normal range of motion.  Skin:    General: Skin is warm and dry.     Capillary Refill: Capillary refill takes less than 2 seconds.  Neurological:     General: No focal deficit present.     Mental Status: He is alert and oriented to person, place, and time. Mental status is at baseline.      No results found for any visits on 12/20/21.  Assessment & Plan     Problem List Items Addressed This Visit       Cardiovascular and Mediastinum   Aortic atherosclerosis ([place])    Chronic, stable The 10-year ASCVD risk score (Arnett DK, et al., 2019) is: 21.8%   Values used to calculate the score:     Age: 57 years     Sex: Male     Is Non-Hispanic African American: No     Diabetic: No     Tobacco smoker: Yes     Systolic Blood Pressure: 366 mmHg     Is BP treated: No     HDL Cholesterol: 35 mg/dL     Total Cholesterol: 185 mg/dL Patient not interested in cholesterol/statin medication, heart healthy diet or cessation of tobacco Request for referral to cardiology for second opinion and possible stress test      Relevant Orders   Ambulatory referral to Cardiology     Genitourinary   Bladder wall thickening - Primary    Acute, unknown prognosis Seen on PET scan; declines concern for UTI UA and UCx placed today      Relevant Orders   Urinalysis, Routine w reflex microscopic   Urine Culture     Other   Elevated LDL cholesterol level    Chronic, elevated Repeat LP Declines use of statin or diet modification      Relevant Orders   Lipid panel   Elevated liver enzymes     Without concern, repeat CMP given previous elevation Likely linked to dietary fat sources       Relevant Orders   Comprehensive metabolic panel     Return in about 4 months (around 04/21/2022) for chonic disease management.      Vonna Kotyk, FNP, have reviewed all documentation for this visit. The documentation on  12/20/21 for the exam, diagnosis, procedures, and orders are all accurate and complete.    Gwyneth Sprout, [place] [phone] (phone) [phone] (fax)  [place]

## 2021-12-20 NOTE — Assessment & Plan Note (Signed)
Without concern, repeat CMP given previous elevation Likely linked to dietary fat sources

## 2021-12-20 NOTE — Assessment & Plan Note (Addendum)
Acute, unknown prognosis Seen on PET scan; declines concern for UTI UA and UCx placed today

## 2021-12-20 NOTE — Assessment & Plan Note (Signed)
Chronic, elevated Repeat LP Declines use of statin or diet modification

## 2021-12-20 NOTE — Assessment & Plan Note (Signed)
Chronic, stable The 10-year ASCVD risk score (Arnett DK, et al., 2019) is: 21.8%   Values used to calculate the score:     Age: 66 years     Sex: Male     Is Non-Hispanic African American: No     Diabetic: No     Tobacco smoker: Yes     Systolic Blood Pressure: 483 mmHg     Is BP treated: No     HDL Cholesterol: 35 mg/dL     Total Cholesterol: 185 mg/dL Patient not interested in cholesterol/statin medication, heart healthy diet or cessation of tobacco Request for referral to cardiology for second opinion and possible stress test

## 2021-12-22 LAB — COMPREHENSIVE METABOLIC PANEL
ALT: 22 IU/L (ref 0–44)
AST: 18 IU/L (ref 0–40)
Albumin/Globulin Ratio: 2 (ref 1.2–2.2)
Albumin: 4.3 g/dL (ref 3.9–4.9)
Alkaline Phosphatase: 131 IU/L — ABNORMAL HIGH (ref 44–121)
BUN/Creatinine Ratio: 16 (ref 10–24)
BUN: 15 mg/dL (ref 8–27)
Bilirubin Total: 0.3 mg/dL (ref 0.0–1.2)
CO2: 24 mmol/L (ref 20–29)
Calcium: 9 mg/dL (ref 8.6–10.2)
Chloride: 103 mmol/L (ref 96–106)
Creatinine, Ser: 0.96 mg/dL (ref 0.76–1.27)
Globulin, Total: 2.2 g/dL (ref 1.5–4.5)
Glucose: 90 mg/dL (ref 70–99)
Potassium: 4.2 mmol/L (ref 3.5–5.2)
Sodium: 141 mmol/L (ref 134–144)
Total Protein: 6.5 g/dL (ref 6.0–8.5)
eGFR: 86 mL/min/{1.73_m2} (ref >59)

## 2021-12-22 LAB — URINALYSIS, ROUTINE W REFLEX MICROSCOPIC
Bilirubin, UA: NEGATIVE
Glucose, UA: NEGATIVE
Ketones, UA: NEGATIVE
Nitrite, UA: NEGATIVE
Protein,UA: NEGATIVE
RBC, UA: NEGATIVE
Specific Gravity, UA: 1.02 (ref 1.005–1.030)
Urobilinogen, Ur: 1 mg/dL (ref 0.2–1.0)
pH, UA: 6 (ref 5.0–7.5)

## 2021-12-22 LAB — URINE CULTURE: Organism ID, Bacteria: NO GROWTH

## 2021-12-22 LAB — MICROSCOPIC EXAMINATION
Bacteria, UA: NONE SEEN
Casts: NONE SEEN /lpf
WBC, UA: NONE SEEN /hpf (ref 0–5)

## 2021-12-22 LAB — LIPID PANEL
Chol/HDL Ratio: 6.3 ratio — ABNORMAL HIGH (ref 0.0–5.0)
Cholesterol, Total: 196 mg/dL (ref 100–199)
HDL: 31 mg/dL — ABNORMAL LOW (ref >39)
LDL Chol Calc (NIH): 99 mg/dL (ref 0–99)
Triglycerides: 389 mg/dL — ABNORMAL HIGH (ref 0–149)
VLDL Cholesterol Cal: 66 mg/dL — ABNORMAL HIGH (ref 5–40)

## 2021-12-23 NOTE — Progress Notes (Signed)
Stable cholesterol.  -good cholesterol remains low -bad cholesterol has improved -fats increased; however, non fasting labs -I recommend diet low in saturated fat and regular exercise - 30 min at least 5 times per week -continue to recommend use of Crestor 20 mg to assist with known ASCVD risks.  Negative urine. Continue to monitor urinary symptoms. No active infection.  Liver enzyme remains elevated. Likely some fat on liver; could perform liver ultrasound if interested.  Gwyneth Sprout, [address] [place], [place] [address] [phone] (phone) [phone] (fax) [place]

## 2021-12-25 ENCOUNTER — Ambulatory Visit: Payer: Medicare HMO | Admitting: Emergency Medicine

## 2021-12-30 ENCOUNTER — Ambulatory Visit: Payer: Medicare HMO | Admitting: Pulmonary Disease

## 2022-01-07 ENCOUNTER — Encounter: Payer: Self-pay | Admitting: Emergency Medicine

## 2022-01-07 ENCOUNTER — Ambulatory Visit: Payer: Medicare HMO | Admitting: Emergency Medicine

## 2022-01-07 DIAGNOSIS — F17200 Nicotine dependence, unspecified, uncomplicated: Secondary | ICD-10-CM

## 2022-01-07 DIAGNOSIS — J449 Chronic obstructive pulmonary disease, unspecified: Secondary | ICD-10-CM | POA: Diagnosis not present

## 2022-01-07 DIAGNOSIS — R69 Illness, unspecified: Secondary | ICD-10-CM | POA: Diagnosis not present

## 2022-01-07 DIAGNOSIS — R911 Solitary pulmonary nodule: Secondary | ICD-10-CM | POA: Diagnosis not present

## 2022-01-07 NOTE — Patient Instructions (Signed)
We reviewed your CT scan and your PET scan today.  The pulmonary nodule of interest is smaller in size.  This is good news. We will plan to repeat your lung cancer screening CT scan in March 2024 We discussed smoking cessation today.  He would benefit from cutting down.  We set a goal for you to cut down to 1 pack daily by our next visit. Follow with [name] in March after your lung cancer screening CT scan so we can review the results together.

## 2022-01-07 NOTE — Addendum Note (Signed)
Addended by: Dierdre Highman on: 01/07/2022 02:49 PM   Modules accepted: Orders

## 2022-01-07 NOTE — Assessment & Plan Note (Signed)
We will plan to repeat your lung cancer screening CT scan in March 2024 We discussed smoking cessation today.  He would benefit from cutting down.  We set a goal for you to cut down to 1 pack daily by our next visit. Follow with [name] in March after your lung cancer screening CT scan so we can review the results together.

## 2022-01-07 NOTE — Assessment & Plan Note (Signed)
Moderately severe obstruction on his pulmonary function testing from [place].  Not currently on BD therapy.  Talked about starting today.  He wants to defer

## 2022-01-07 NOTE — Assessment & Plan Note (Signed)
.    Pulmonary nodule noted on his lung cancer screening CT is smaller.  Consistent with benign etiology.  We will plan to get him back into the lung cancer screening program, next scan in March 2024.

## 2022-01-07 NOTE — Progress Notes (Unsigned)
Cardiology Clinic Note   Patient Name: Thomas Cross Date of Encounter: 01/08/2022  Primary Care Provider:  [name], PA-C Primary Cardiologist:  Thomas Sable, MD  Patient Profile    66 year old male with past medical history of gastroesophageal reflux disease, arthritis, current smoker x40+ years, obstructive sleep apnea, COPD, and palpitations, who presents for follow-up fatigue and palpitations.   Past Medical History    Past Medical History:  Diagnosis Date   Arthritis    GERD (gastroesophageal reflux disease)    RARE-TUMS   Sleep apnea    Past Surgical History:  Procedure Laterality Date   COLONOSCOPY  2009   COLONOSCOPY WITH PROPOFOL N/A 07/27/2020   Procedure: COLONOSCOPY WITH PROPOFOL;  Surgeon: Thomas Bellows, MD;  Location: [facility] ENDOSCOPY;  Service: Gastroenterology;  Laterality: N/A;   HERNIA REPAIR  30 years ago   right inguinal   INGUINAL HERNIA REPAIR Right 05/20/2016   Procedure: LAPAROSCOPIC INGUINAL HERNIA;  Surgeon: Thomas Lye, MD;  Location: [facility] ORS;  Service: General;  Laterality: Right;   TONSILLECTOMY AND ADENOIDECTOMY      Allergies  No Known Allergies  History of Present Illness    66 year old male with a past medical history of gastroesophageal reflux disease, arthritis, current smoker x40+ years, OSA, COPD, who previously been seen for shortness of breath and snoring.  Previous [name] been seen in the office due to his shortness of breath and snoring.  He was referred to pulmonary medicine for altered sleep with concerns of OSA and also the history of smoking.  Work-up did reveal OSA and COPD.  Management was being planned as per pulmonary medicine.  He was initially compliant with the CPAP mask which caused energy levels to go up, started having throat pain and was no longer compliant with his CPAP.  He continues to smoke he did have PFTs that were completed but had not followed up at that time for  recommendations.  Echocardiogram done 09/2019 revealed LVEF of 55 to 60%, no regional wall motion abnormalities, and left atrial size is mildly dilated.  Patient was placed on a monitor 06/2021 for complaints of palpitations which revealed several runs of SVT.  PFTs were completed in 12/2019 revealed the patient is COPD Gold stage II with a diffusion defect likely emphysema, emphasis on the importance of smoking cessation.  He returns back to clinic today accompanied by his daughter. Complains or increased fatigue, but denies any chest pain or shortness of breath. Recently has followed up with pulmonary and had a PET scan completed that revealed a pulmonary nodule in the right lung apex as well as aortic atherosclerosis. He is questioning if he needed any other testing completed after that finding. He denies any hospitalizations or visits to the emergency department.   Home Medications    Current Outpatient Medications  Medication Sig Dispense Refill   aspirin EC 81 MG tablet Take 81 mg by mouth every evening.     calcium carbonate (TUMS - DOSED IN MG ELEMENTAL CALCIUM) 500 MG chewable tablet Chew 1 tablet by mouth as needed for indigestion or heartburn.     Omega-3 Fatty Acids (FISH OIL) 1000 MG CPDR Take 1 capsule by mouth every evening.      No current facility-administered medications for this visit.     Family History    Family History  Problem Relation Age of Onset   Heart disease Mother    Kidney cancer Mother    Bladder Cancer Mother  Arthritis Mother    CVA Father    Prostate cancer Father    Dementia Father    Hypertension Father    Heart attack Father    Heart attack Brother    Hyperlipidemia Brother    Heart disease Maternal Grandfather    Stroke Paternal Grandmother    Colon cancer Paternal Grandfather    Hyperlipidemia Sister    Hyperlipidemia Brother    Hyperlipidemia Brother    Hyperlipidemia Brother    He indicated that his mother is deceased. He indicated that  his father is deceased. He indicated that the status of his sister is unknown. He indicated that one of his four brothers is deceased. He indicated that his maternal grandfather is deceased. He indicated that his paternal grandmother is deceased. He indicated that his paternal grandfather is deceased.  Social History    Social History   Socioeconomic History   Marital status: Married    Spouse name: Not on file   Number of children: 2   Years of education: Not on file   Highest education level: High school graduate  Occupational History   Occupation: full time / owner  Tobacco Use   Smoking status: Every Day    Packs/day: 1.50    Years: 45.00    Total pack years: 67.50    Types: Cigarettes   Smokeless tobacco: Never   Tobacco comments:    30 -40  cigarettes smoked daily [facility] 01/07/22  Vaping Use   Vaping Use: Never used  Substance and Sexual Activity   Alcohol use: Yes    Alcohol/week: 14.0 standard drinks of alcohol    Types: 14 Shots of liquor per week    Comment: 2 MIXED DRINKS EVERY DAY   Drug use: No   Sexual activity: Not on file  Other Topics Concern   Not on file  Social History Narrative   Not on file   Social Determinants of Health   Financial Resource Strain: Low Risk  (07/01/2021)   Overall Financial Resource Strain (CARDIA)    Difficulty of Paying Living Expenses: Not hard at all  Food Insecurity: No Food Insecurity (07/01/2021)   Hunger Vital Sign    Worried About Running Out of Food in the Last Year: Never true    Ran Out of Food in the Last Year: Never true  Transportation Needs: No Transportation Needs (07/01/2021)   PRAPARE - Hydrologist (Medical): No    Lack of Transportation (Non-Medical): No  Physical Activity: Insufficiently Active (07/01/2021)   Exercise Vital Sign    Days of Exercise per Week: 3 days    Minutes of Exercise per Session: 30 min  Stress: No Stress Concern Present (07/01/2021)   [place]    Feeling of Stress : Only a little  Social Connections: Socially Integrated (07/01/2021)   Social Connection and Isolation Panel [NHANES]    Frequency of Communication with Friends and Family: More than three times a week    Frequency of Social Gatherings with Friends and Family: More than three times a week    Attends Religious Services: More than 4 times per year    Active Member of [employer] or Organizations: Yes    Attends Archivist Meetings: More than 4 times per year    Marital Status: Married  Human resources officer Violence: Not At Risk (07/01/2021)   Humiliation, Afraid, Rape, and Kick questionnaire    Fear of Current  or Ex-Partner: No    Emotionally Abused: No    Physically Abused: No    Sexually Abused: No     Review of Systems    General:  No chills, fever, night sweats or weight changes. Endorses fatigue. Cardiovascular:  No chest pain, dyspnea on exertion, edema, orthopnea, palpitations, paroxysmal nocturnal dyspnea. Dermatological: No rash, lesions/masses Respiratory: No cough, dyspnea Urologic: No hematuria, dysuria Abdominal:   No nausea, vomiting, diarrhea, bright red blood per rectum, melena, or hematemesis Neurologic:  No visual changes, wkns, changes in mental status. All other systems reviewed and are otherwise negative except as noted above.   Physical Exam    VS:  BP 130/82 (BP Location: Right Arm, Patient Position: Sitting, Cuff Size: Normal)   Pulse 63   Ht 6' (1.829 m)   Wt 201 lb (91.2 kg)   SpO2 98%   BMI 27.26 kg/m  , BMI Body mass index is 27.26 kg/m.     GEN: Well nourished, well developed, in no acute distress. HEENT: normal. Neck: Supple, no JVD, carotid bruits, or masses. Cardiac: RRR, no murmurs, rubs, or gallops. No clubbing, cyanosis, edema.  Radials/DP/PT 2+ and equal bilaterally.  Respiratory:  Respirations regular and unlabored, clear to auscultation bilaterally. GI: Soft,  nontender, nondistended, BS + x 4. MS: no deformity or atrophy. Skin: warm and dry, no rash. Neuro:  Strength and sensation are intact. Psych: Normal affect.  Accessory Clinical Findings    ECG personally reviewed by me today-sinus rhythm with a rate of 63 with LVH- No acute changes  Lab Results  Component Value Date   WBC 6.4 07/26/2021   HGB 15.5 07/26/2021   HCT 44.6 07/26/2021   MCV 99 (H) 07/26/2021   PLT 228 07/26/2021   Lab Results  Component Value Date   CREATININE 0.96 12/20/2021   BUN 15 12/20/2021   NA 141 12/20/2021   K 4.2 12/20/2021   CL 103 12/20/2021   CO2 24 12/20/2021   Lab Results  Component Value Date   ALT 22 12/20/2021   AST 18 12/20/2021   ALKPHOS 131 (H) 12/20/2021   BILITOT 0.3 12/20/2021   Lab Results  Component Value Date   CHOL 196 12/20/2021   HDL 31 (L) 12/20/2021   LDLCALC 99 12/20/2021   TRIG 389 (H) 12/20/2021   CHOLHDL 6.3 (H) 12/20/2021    Lab Results  Component Value Date   HGBA1C 5.2 07/26/2021    Assessment & Plan   1.  Shortness of breath/ dyspnea on exertion that is chronic and not likely an anginal equivalent. He continues to follow with pulmonary and was recently found to have a 74m lung nodule noted on PET scan.  Nodule was consistent with benign etiology.  He is to continue with the lung cancer screening program his neck scan will be in March 2024.  Last echocardiogram revealed normal systolic and diastolic function, EF 55 to 60%.  Pulmonary function testing was completed revealed moderately severe obstruction.  He is not currently on BD therapy but declined at his follow-up with pulmonary.   2.  Obstructive sleep apnea diagnosed by pulmonary medicine.  CPAP compliance advised.  Continue to follow with pulmonary for CPAP mask fittings and titration  3.  Current smoker x40+ years.  Smoking cessation is advised.  4.  Mixed hyperlipidemia with last LDL of 99 on 12/20/21.  He is not interested in starting a statin medication  at this time.  Did advise him with his a low HDL  IN elevated LDL without high-fat diet he should consider at minimum over-the-counter red yeast rice 600 mg daily but statin therapy would be more beneficial for him.  Continues to be followed by PCP  5. Hypertension with blood pressure today of 130/82. Stable. Currently not on any antihypertensives. Continue to monitor.   6.  Disposition patient to return to clinic to see MD/APP in 1 year or sooner if needed  Chaitra Mast, NP 01/08/2022, 11:58 AM

## 2022-01-07 NOTE — Progress Notes (Signed)
Subjective:    Patient ID: Thomas Cross, male    DOB: [birth year], 66 y.o.   MRN: [id number]  HPI 66 year old active smoker (68 pack years) with a history of GERD, OSA.  He participates in the lung cancer screening program and is here to review an abnormal CT chest. He is not currently on inhaled medication, although he has been tried on albuterol before in the past.   Lung cancer screening CT 09/25/2021 reviewed by me shows no mediastinal or hilar adenopathy.  There is a right upper lobe part solid 11.6 mm nodule with a 4.6 mm solid component that has enlarged from 5 mm on his previous scan.  PFT 12/19/19 reviewed by me shows moderate obstruction, FEV1 69% predicted.    ROV 01/07/22 --58 here homely him, active smoker.  He was seen in May after his lung cancer screening CT scan of the chest showed an enlarging right upper lobe part solid pulmonary nodule.  Based on this we repeated his imaging as below to restratify and to consider either possible biopsy or resection if indicated. He denies any significant SOB. He has used albuterol at times in the past. He has a daily cough with clear mucous. He hears wheeze occasionally.   CT chest/PET scan 12/11/2021 reviewed by me showed that the 11 mm mixed density right upper lobe pulmonary nodule has decreased in size and has actually almost fully resolved, now 4 mm.  No evidence for hypermetabolism on the PET scan.   Review of Systems As per HPi  Past Medical History:  Diagnosis Date   Arthritis    GERD (gastroesophageal reflux disease)    RARE-TUMS   Sleep apnea      Family History  Problem Relation Age of Onset   Heart disease Mother    Kidney cancer Mother    Bladder Cancer Mother    Arthritis Mother    CVA Father    Prostate cancer Father    Dementia Father    Hypertension Father    Heart attack Father    Heart attack Brother    Hyperlipidemia Brother    Heart disease Maternal Grandfather    Stroke Paternal Grandmother    Colon  cancer Paternal Grandfather    Hyperlipidemia Sister    Hyperlipidemia Brother    Hyperlipidemia Brother    Hyperlipidemia Brother      Social History   Socioeconomic History   Marital status: Married    Spouse name: Not on file   Number of children: 2   Years of education: Not on file   Highest education level: High school graduate  Occupational History   Occupation: full time / owner  Tobacco Use   Smoking status: Every Day    Packs/day: 1.50    Years: 45.00    Total pack years: 67.50    Types: Cigarettes   Smokeless tobacco: Never   Tobacco comments:    30 -40  cigarettes smoked daily [facility] 01/07/22  Vaping Use   Vaping Use: Never used  Substance and Sexual Activity   Alcohol use: Yes    Alcohol/week: 14.0 standard drinks of alcohol    Types: 14 Shots of liquor per week    Comment: 2 MIXED DRINKS EVERY DAY   Drug use: No   Sexual activity: Not on file  Other Topics Concern   Not on file  Social History Narrative   Not on file   Social Determinants of Health   Financial Resource Strain: Low Risk  (  07/01/2021)   Overall Financial Resource Strain (CARDIA)    Difficulty of Paying Living Expenses: Not hard at all  Food Insecurity: No Food Insecurity (07/01/2021)   Hunger Vital Sign    Worried About Running Out of Food in the Last Year: Never true    Ran Out of Food in the Last Year: Never true  Transportation Needs: No Transportation Needs (07/01/2021)   PRAPARE - Hydrologist (Medical): No    Lack of Transportation (Non-Medical): No  Physical Activity: Insufficiently Active (07/01/2021)   Exercise Vital Sign    Days of Exercise per Week: 3 days    Minutes of Exercise per Session: 30 min  Stress: No Stress Concern Present (07/01/2021)   [place]    Feeling of Stress : Only a little  Social Connections: Socially Integrated (07/01/2021)   Social Connection and Isolation  Panel [NHANES]    Frequency of Communication with Friends and Family: More than three times a week    Frequency of Social Gatherings with Friends and Family: More than three times a week    Attends Religious Services: More than 4 times per year    Active Member of [employer] or Organizations: Yes    Attends Music therapist: More than 4 times per year    Marital Status: Married  Human resources officer Violence: Not At Risk (07/01/2021)   Humiliation, Afraid, Rape, and Kick questionnaire    Fear of Current or Ex-Partner: No    Emotionally Abused: No    Physically Abused: No    Sexually Abused: No      No Known Allergies   Outpatient Medications Prior to Visit  Medication Sig Dispense Refill   aspirin EC 81 MG tablet Take 81 mg by mouth every evening.     calcium carbonate (TUMS - DOSED IN MG ELEMENTAL CALCIUM) 500 MG chewable tablet Chew 1 tablet by mouth as needed for indigestion or heartburn.     Omega-3 Fatty Acids (FISH OIL) 1000 MG CPDR Take 1 capsule by mouth every evening.      No facility-administered medications prior to visit.        Objective:   Physical Exam  Vitals:   01/07/22 1416  BP: 120/68  Pulse: 70  Temp: 98.3 F (36.8 C)  TempSrc: Oral  SpO2: 100%  Weight: 199 lb 9.6 oz (90.5 kg)  Height: 6' (1.829 m)   Gen: Pleasant, well-nourished, in no distress,  normal affect  ENT: No lesions,  mouth clear,  oropharynx clear, no postnasal drip  Neck: No JVD, no stridor  Lungs: No use of accessory muscles, no crackles or wheezing on normal respiration, no wheeze on forced expiration  Cardiovascular: RRR, heart sounds normal, no murmur or gallops, no peripheral edema  Musculoskeletal: No deformities, no cyanosis or clubbing  Neuro: alert, awake, non focal  Skin: Warm, no lesions or rash     Assessment & Plan:  Smoker We will plan to repeat your lung cancer screening CT scan in March 2024 We discussed smoking cessation today.  He would benefit from  cutting down.  We set a goal for you to cut down to 1 pack daily by our next visit. Follow with Thomas Cross in March after your lung cancer screening CT scan so we can review the results together.  Suspected COPD  Moderately severe obstruction on his pulmonary function testing from [place].  Not currently on BD therapy.  Talked  about starting today.  He wants to defer  Pulmonary nodule .  Pulmonary nodule noted on his lung cancer screening CT is smaller.  Consistent with benign etiology.  We will plan to get him back into the lung cancer screening program, next scan in March 2024.   Baltazar Apo, MD, PhD 01/07/2022, 2:39 PM [facility] [phone] or if no answer before 7:00PM call [phone] For any issues after 7:00PM please call eLink [phone]

## 2022-01-08 ENCOUNTER — Encounter: Payer: Self-pay | Admitting: Cardiology

## 2022-01-08 ENCOUNTER — Ambulatory Visit (INDEPENDENT_AMBULATORY_CARE_PROVIDER_SITE_OTHER): Payer: Medicare HMO | Admitting: Cardiology

## 2022-01-08 VITALS — BP 130/82 | HR 63 | Ht 72.0 in | Wt 201.0 lb

## 2022-01-08 DIAGNOSIS — F17200 Nicotine dependence, unspecified, uncomplicated: Secondary | ICD-10-CM

## 2022-01-08 DIAGNOSIS — I10 Essential (primary) hypertension: Secondary | ICD-10-CM | POA: Diagnosis not present

## 2022-01-08 DIAGNOSIS — G4733 Obstructive sleep apnea (adult) (pediatric): Secondary | ICD-10-CM | POA: Diagnosis not present

## 2022-01-08 DIAGNOSIS — E782 Mixed hyperlipidemia: Secondary | ICD-10-CM | POA: Diagnosis not present

## 2022-01-08 DIAGNOSIS — R0600 Dyspnea, unspecified: Secondary | ICD-10-CM

## 2022-01-08 DIAGNOSIS — R69 Illness, unspecified: Secondary | ICD-10-CM | POA: Diagnosis not present

## 2022-01-08 NOTE — Patient Instructions (Signed)
Medication Instructions:  Your physician recommends that you continue on your current medications as directed. Please refer to the Current Medication list given to you today.  *If you need a refill on your cardiac medications before your next appointment, please call your pharmacy*   Lab Work: None ordered If you have labs (blood work) drawn today and your tests are completely normal, you will receive your results only by: [place] (if you have MyChart) OR A paper copy in the mail If you have any lab test that is abnormal or we need to change your treatment, we will call you to review the results.   Testing/Procedures: None ordered   Follow-Up: At [facility], you and your health needs are our priority.  As part of our continuing mission to provide you with exceptional heart care, we have created designated Provider Care Teams.  These Care Teams include your primary Cardiologist (physician) and Advanced Practice Providers (APPs -  Physician Assistants and Nurse Practitioners) who all work together to provide you with the care you need, when you need it.  We recommend signing up for the patient portal called "MyChart".  Sign up information is provided on this After Visit Summary.  MyChart is used to connect with patients for Virtual Visits (Telemedicine).  Patients are able to view lab/test results, encounter notes, upcoming appointments, etc.  Non-urgent messages can be sent to your provider as well.   To learn more about what you can do with MyChart, go to [email or web address].    Your next appointment:   1 year(s)  The format for your next appointment:   In Person  Provider:   You may see Kate Sable, MD or one of the following Advanced Practice Providers on your designated Care Team:    - Gerrie Nordmann, NP   Important Information About Sugar

## 2022-01-31 ENCOUNTER — Other Ambulatory Visit: Payer: Self-pay

## 2022-01-31 DIAGNOSIS — F17210 Nicotine dependence, cigarettes, uncomplicated: Secondary | ICD-10-CM

## 2022-01-31 DIAGNOSIS — Z122 Encounter for screening for malignant neoplasm of respiratory organs: Secondary | ICD-10-CM

## 2022-01-31 DIAGNOSIS — Z87891 Personal history of nicotine dependence: Secondary | ICD-10-CM

## 2022-02-11 ENCOUNTER — Encounter: Payer: Self-pay | Admitting: Emergency Medicine

## 2022-02-11 ENCOUNTER — Ambulatory Visit: Payer: Medicare HMO | Admitting: Emergency Medicine

## 2022-02-11 DIAGNOSIS — J449 Chronic obstructive pulmonary disease, unspecified: Secondary | ICD-10-CM

## 2022-02-11 MED ORDER — ALBUTEROL SULFATE HFA 108 (90 BASE) MCG/ACT IN AERS: 2.0000 | INHALATION_SPRAY | Freq: Four times a day (QID) | RESPIRATORY_TRACT | 6 refills | Status: DC | PRN

## 2022-02-11 MED ORDER — PREDNISONE 10 MG PO TABS: ORAL_TABLET | ORAL | 0 refills | Status: DC

## 2022-02-11 MED ORDER — AZITHROMYCIN 250 MG PO TABS: ORAL_TABLET | ORAL | 0 refills | Status: DC

## 2022-02-11 MED ORDER — AEROCHAMBER MV MISC: 0 refills | Status: DC

## 2022-02-11 NOTE — Patient Instructions (Addendum)
Please take prednisone as directed until completely gone. Please take azithromycin as directed until completely gone. Okay to continue your Mucinex, over-the-counter cold medication as you have been taking We will give you a prescription for albuterol.  You can use 2 puffs up to every 4 hours if needed for shortness of breath, chest tightness, wheezing. Follow with APP in 3 to 4 weeks to ensure that you are improving. We consider arranging pulmonary function testing after your breathing has returned to stable baseline to evaluate for evidence for COPD. You need to work on decreasing your cigarettes.  Ultimate goal should be to stop altogether. Follow with Dr Lamonte Sakai in 6 months or sooner if you have any problems

## 2022-02-11 NOTE — Assessment & Plan Note (Signed)
Now with acute flare.  Some of this sounds upper airway in nature.  I do not see any evidence of epiglottitis or tongue enlargement on exam.  All in the setting of a URI.  He likely does have underlying COPD but he had wanted PFTs or therapy at this point.  We will need to consider going forward.  Please take prednisone as directed until completely gone. Please take azithromycin as directed until completely gone. Okay to continue your Mucinex, over-the-counter cold medication as you have been taking We will give you a prescription for albuterol.  You can use 2 puffs up to every 4 hours if needed for shortness of breath, chest tightness, wheezing. Follow with APP in 3 to 4 weeks to ensure that you are improving. We consider arranging pulmonary function testing after your breathing has returned to stable baseline to evaluate for evidence for COPD. You need to work on decreasing your cigarettes.  Ultimate goal should be to stop altogether. Follow with Dr Lamonte Sakai in 6 months or sooner if you have any problems

## 2022-02-11 NOTE — Progress Notes (Signed)
   Subjective:    Patient ID: Thomas Cross, male    DOB: 1956-02-06, 66 y.o.   MRN: [id number]  HPI  ROV 01/07/22 --66  active smoker.  He was seen in May after his lung cancer screening CT scan of the chest showed an enlarging right upper lobe part solid pulmonary nodule.  Based on this we repeated his imaging as below to restratify and to consider either possible biopsy or resection if indicated. He denies any significant SOB. He has used albuterol at times in the past. He has a daily cough with clear mucous. He hears wheeze occasionally.   CT chest/PET scan 12/11/2021 reviewed by me showed that the 11 mm mixed density right upper lobe pulmonary nodule has decreased in size and has actually almost fully resolved, now 4 mm.  No evidence for hypermetabolism on the PET scan.  Acute OV 02/11/2022 --Thomas Cross is 66 with a history of moderate obstruction, COPD.  I have followed him for this and for pulmonary nodular disease.  He continues to smoke  At his last visit we deferred starting bronchodilator therapy. Today he reports that he developed URI sx, cough prod of clear. Has been sick for about 4 days.     Review of Systems As per HPi     Objective:   Physical Exam  Vitals:   02/11/22 1022  BP: (!) 140/84  Pulse: 65  Temp: 97.9 F (36.6 C)  TempSrc: Oral  SpO2: 93%  Weight: 202 lb 9.6 oz (91.9 kg)  Height: 6' (1.829 m)   Gen: Pleasant, well-nourished, in no distress,  normal affect  ENT: No lesions,  mouth clear,  oropharynx clear, no postnasal drip  Neck: No JVD, inspiratory and expiratory upper airway noise  Lungs: No use of accessory muscles, some referred upper airway noise, bilateral end expiratory wheezing  Cardiovascular: RRR, heart sounds normal, no murmur or gallops, no peripheral edema  Musculoskeletal: No deformities, no cyanosis or clubbing  Neuro: alert, awake, non focal  Skin: Warm, no lesions or rash     Assessment & Plan:  Suspected COPD  Now with  acute flare.  Some of this sounds upper airway in nature.  I do not see any evidence of epiglottitis or tongue enlargement on exam.  All in the setting of a URI.  He likely does have underlying COPD but he had wanted PFTs or therapy at this point.  We will need to consider going forward.  Please take prednisone as directed until completely gone. Please take azithromycin as directed until completely gone. Okay to continue your Mucinex, over-the-counter cold medication as you have been taking We will give you a prescription for albuterol.  You can use 2 puffs up to every 4 hours if needed for shortness of breath, chest tightness, wheezing. Follow with APP in 3 to 4 weeks to ensure that you are improving. We consider arranging pulmonary function testing after your breathing has returned to stable baseline to evaluate for evidence for COPD. You need to work on decreasing your cigarettes.  Ultimate goal should be to stop altogether. Follow with Dr Lamonte Sakai in 6 months or sooner if you have any problems   Baltazar Apo, MD, PhD 02/11/2022, 11:02 AM [facility] [phone] or if no answer before 7:00PM call [phone] For any issues after 7:00PM please call eLink [phone]

## 2022-02-11 NOTE — Addendum Note (Signed)
Addended by: Gavin Potters R on: 02/11/2022 11:13 AM   Modules accepted: Orders

## 2022-02-11 NOTE — Addendum Note (Signed)
Addended by: Gavin Potters R on: 02/11/2022 11:08 AM   Modules accepted: Orders

## 2022-03-06 ENCOUNTER — Ambulatory Visit: Payer: Medicare HMO | Admitting: Primary Care

## 2022-03-07 ENCOUNTER — Ambulatory Visit (INDEPENDENT_AMBULATORY_CARE_PROVIDER_SITE_OTHER): Payer: Medicare HMO | Admitting: Primary Care

## 2022-03-07 ENCOUNTER — Encounter: Payer: Self-pay | Admitting: Primary Care

## 2022-03-07 VITALS — BP 132/76 | HR 62 | Temp 97.6°F | Ht 72.0 in | Wt 198.6 lb

## 2022-03-07 DIAGNOSIS — G4733 Obstructive sleep apnea (adult) (pediatric): Secondary | ICD-10-CM | POA: Diagnosis not present

## 2022-03-07 DIAGNOSIS — J449 Chronic obstructive pulmonary disease, unspecified: Secondary | ICD-10-CM

## 2022-03-07 NOTE — Assessment & Plan Note (Signed)
-   Moderate OSA, HST in July 2021 >> 17/hour. He is asymptomatic, sleeping well without complaints. Intolerant to CPAP. Advised he focus on side sleeping position or get wedge pillow to elevate head of bed while sleeping

## 2022-03-07 NOTE — Assessment & Plan Note (Addendum)
-   Current smoker. Patient had PFTs in August 2021 that showed mild obstructive lung disease. For the most part he is asymptomatic day to day. Treated for acute exacerbation end of September with Zpack and pred taper, respiratory symptoms resolved. He keeps a chronic cough. He does not require Albuterol. Needs updated pulmonary function testing. FU in 3 months or sooner if needed. Smoking cessation strongly encouraged.

## 2022-03-07 NOTE — Progress Notes (Signed)
$'@Patient'O$  ID: Thomas Cross, male    DOB: [birth year], 66 y.o.   MRN: [id number]  Chief Complaint  Patient presents with   Follow-up    Cough w/clear phlem persistent.  Improved from last ov.    Referring provider: [name], PA-C  HPI: 66 year old male, current everyday smoker.  Past medical history significant for COPD.  Patient of [name], last seen on 02/11/2022.  Previous [facility] pulmonary encounter: ROV 01/07/22 --66  active smoker.  He was seen in May after his lung cancer screening CT scan of the chest showed an enlarging right upper lobe part solid pulmonary nodule.  Based on this we repeated his imaging as below to restratify and to consider either possible biopsy or resection if indicated. He denies any significant SOB. He has used albuterol at times in the past. He has a daily cough with clear mucous. He hears wheeze occasionally.   CT chest/PET scan 12/11/2021 reviewed by me showed that the 11 mm mixed density right upper lobe pulmonary nodule has decreased in size and has actually almost fully resolved, now 4 mm.  No evidence for hypermetabolism on the PET scan.  Acute OV 02/11/2022 --Mr. Aleman is 18 with a history of moderate obstruction, COPD.  I have followed him for this an for pulmonary nodular disease.  He continues to smoke  At his last visit we deferred starting bronchodilator therapy. Today he reports that he developed URI sx, cough prod of clear. Has been sick for about 4 days.    03/07/2022 - Interim hx  Patient presents today for 3 to 4-week follow-up.  She was last seen on September 26 by [name] and treated for acute COPD exacerbation with Z-Pak and prednisone taper. Accomapnied by his daughter. He is feeling better, back to baseline. Completed prednisone and abx. He seldomly has shortness of breath. He keeps a chronic cough, mucus has cleared in color. Has not needed to use Albuterol. He does not wear CPAP. Sleeps well.    No Known Allergies  Immunization  History  Administered Date(s) Administered   Pneumococcal Polysaccharide-23 11/08/2019   Tdap 03/18/2011    Past Medical History:  Diagnosis Date   Arthritis    GERD (gastroesophageal reflux disease)    RARE-TUMS   Sleep apnea     Tobacco History: Social History   Tobacco Use  Smoking Status Every Day   Packs/day: 1.50   Years: 45.00   Total pack years: 67.50   Types: Cigarettes  Smokeless Tobacco Never  Tobacco Comments   30  cigarettes smoked daily ARJ 02/11/22   Ready to quit: Not Answered Counseling given: Not Answered Tobacco comments: 30  cigarettes smoked daily ARJ 02/11/22   Outpatient Medications Prior to Visit  Medication Sig Dispense Refill   aspirin EC 81 MG tablet Take 81 mg by mouth every evening.     calcium carbonate (TUMS - DOSED IN MG ELEMENTAL CALCIUM) 500 MG chewable tablet Chew 1 tablet by mouth as needed for indigestion or heartburn.     Omega-3 Fatty Acids (FISH OIL) 1000 MG CPDR Take 1 capsule by mouth every evening.      albuterol (VENTOLIN HFA) 108 (90 Base) MCG/ACT inhaler Inhale 2 puffs into the lungs every 6 (six) hours as needed for wheezing or shortness of breath. (Patient not taking: Reported on 03/07/2022) 8 g 6   azithromycin (ZITHROMAX Z-PAK) 250 MG tablet Take 2 tabs today, then 1 tab until gone (Patient not taking: Reported on 03/07/2022) 6  each 0   predniSONE (DELTASONE) 10 MG tablet Take 4 tablets X 3 days, 3 tabs X 3 days, 2 tabs x 3 days, 1 tab x 3 days (Patient not taking: Reported on 03/07/2022) 30 tablet 0   Spacer/Aero-Holding Chambers (AEROCHAMBER MV) inhaler Use as instructed (Patient not taking: Reported on 03/07/2022) 1 each 0   No facility-administered medications prior to visit.   Review of Systems  Review of Systems  Constitutional: Negative.  Negative for fatigue.  HENT: Negative.    Respiratory:  Positive for cough. Negative for apnea, chest tightness, shortness of breath and wheezing.   Cardiovascular: Negative.    Psychiatric/Behavioral:  Negative for sleep disturbance.     Physical Exam  BP 132/76 (BP Location: Left Arm, Patient Position: Sitting, Cuff Size: Normal)   Pulse 62   Temp 97.6 F (36.4 C) (Oral)   Ht 6' (1.829 m)   Wt 198 lb 9.6 oz (90.1 kg)   SpO2 99%   BMI 26.94 kg/m  Physical Exam Constitutional:      Appearance: Normal appearance.  HENT:     Head: Normocephalic and atraumatic.     Mouth/Throat:     Mouth: Mucous membranes are moist.     Pharynx: Oropharynx is clear.  Cardiovascular:     Rate and Rhythm: Normal rate and regular rhythm.  Pulmonary:     Effort: Pulmonary effort is normal.     Breath sounds: Normal breath sounds.     Comments: CTA Musculoskeletal:        General: Normal range of motion.  Neurological:     General: No focal deficit present.     Mental Status: He is alert and oriented to person, place, and time. Mental status is at baseline.  Psychiatric:        Mood and Affect: Mood normal.        Behavior: Behavior normal.        Thought Content: Thought content normal.        Judgment: Judgment normal.      Lab Results:  CBC    Component Value Date/Time   WBC 6.4 07/26/2021 1111   WBC 7.1 04/08/2017 0816   RBC 4.50 07/26/2021 1111   RBC 4.51 04/08/2017 0816   HGB 15.5 07/26/2021 1111   HCT 44.6 07/26/2021 1111   PLT 228 07/26/2021 1111   MCV 99 (H) 07/26/2021 1111   MCH 34.4 (H) 07/26/2021 1111   MCH 33.5 (H) 04/08/2017 0816   MCHC 34.8 07/26/2021 1111   MCHC 35.0 04/08/2017 0816   RDW 12.3 07/26/2021 1111   LYMPHSABS 1.6 07/26/2021 1111   EOSABS 0.1 07/26/2021 1111   BASOSABS 0.1 07/26/2021 1111    BMET    Component Value Date/Time   NA 141 12/20/2021 1516   K 4.2 12/20/2021 1516   CL 103 12/20/2021 1516   CO2 24 12/20/2021 1516   GLUCOSE 90 12/20/2021 1516   GLUCOSE 87 04/08/2017 0816   BUN 15 12/20/2021 1516   CREATININE 0.96 12/20/2021 1516   CREATININE 0.95 04/08/2017 0816   CALCIUM 9.0 12/20/2021 1516   GFRNONAA  75 09/27/2019 1002   GFRNONAA 85 04/08/2017 0816   GFRAA 87 09/27/2019 1002   GFRAA 98 04/08/2017 0816    BNP No results found for: "BNP"  ProBNP No results found for: "PROBNP"  Imaging: No results found.   Assessment & Plan:   COPD (chronic obstructive pulmonary disease) ([place]) - Current smoker. Patient had PFTs in August 2021 that showed  mild obstructive lung disease. For the most part he is asymptomatic day to day. Treated for acute exacerbation end of September with Zpack and pred taper, respiratory symptoms resolved. He keeps a chronic cough. He does not require Albuterol. Needs updated pulmonary function testing. FU in 3 months or sooner if needed. Smoking cessation strongly encouraged.   OSA (obstructive sleep apnea) - Moderate OSA, HST in July 2021 >> 17/hour. He is asymptomatic, sleeping well without complaints. Intolerant to CPAP. Advised he focus on side sleeping position or get wedge pillow to elevate head of bed while sleeping    Martyn Ehrich, NP 03/07/2022

## 2022-03-07 NOTE — Patient Instructions (Addendum)
Continue Albuterol 2 puffs every 4-6 hours for shortness of breath/wheezing Take mucinex '600mg'$  twice daily as needed for congestion If sleep symptoms worsen, please notify us and recommend you restart CPAP (philips machine was recalled, no resmed)  Orders: Pulmonary function testing re: COPD mild   Follow-up: 3 months with Dr. Brock Ra / PFTs first available

## 2022-05-02 ENCOUNTER — Encounter: Payer: Self-pay | Admitting: Physician Assistant

## 2022-05-02 ENCOUNTER — Ambulatory Visit (INDEPENDENT_AMBULATORY_CARE_PROVIDER_SITE_OTHER): Payer: Medicare HMO | Admitting: Physician Assistant

## 2022-05-02 VITALS — BP 136/82 | HR 60 | Resp 16 | Ht 72.0 in | Wt 203.0 lb

## 2022-05-02 DIAGNOSIS — N529 Male erectile dysfunction, unspecified: Secondary | ICD-10-CM

## 2022-05-02 DIAGNOSIS — E782 Mixed hyperlipidemia: Secondary | ICD-10-CM | POA: Diagnosis not present

## 2022-05-02 DIAGNOSIS — E781 Pure hyperglyceridemia: Secondary | ICD-10-CM | POA: Insufficient documentation

## 2022-05-02 MED ORDER — TADALAFIL 10 MG PO TABS: 10.0000 mg | ORAL_TABLET | Freq: Every day | ORAL | 1 refills | Status: DC | PRN

## 2022-05-02 NOTE — Progress Notes (Signed)
Established patient visit   Patient: Thomas Cross   DOB: 1955/11/08   66 y.o. Male  MRN: [id number] Visit Date: 05/02/2022  Today's healthcare provider: [name], PA-C   I,Tiffany J Bragg,acting as a scribe for [name], PA-C.,have documented all relevant documentation on the behalf of [name], PA-C,as directed by  [name], PA-C while in the presence of [name], PA-C.   Chief Complaint  Patient presents with   Hyperlipidemia   Erectile Dysfunction    Patient would like a script for ED, states he was on one at one time but ran out.   Subjective    HPI Pt reports erectile dysfunction for years. Historically had tried sildenafil with limited improvement. Denies any changes to condition.   Lipid/Cholesterol, Follow-up  Last lipid panel Other pertinent labs  Lab Results  Component Value Date   CHOL 196 12/20/2021   HDL 31 (L) 12/20/2021   LDLCALC 99 12/20/2021   TRIG 389 (H) 12/20/2021   CHOLHDL 6.3 (H) 12/20/2021   Lab Results  Component Value Date   ALT 22 12/20/2021   AST 18 12/20/2021   PLT 228 07/26/2021   TSH 1.770 07/26/2021     He was last seen for this 6 months ago.  Management since that visit includes no changes, not on script for lipids.  He reports excellent compliance with treatment.  Symptoms: No chest pain No chest pressure/discomfort  Yes dyspnea No lower extremity edema  No numbness or tingling of extremity No orthopnea  No palpitations No paroxysmal nocturnal dyspnea  No speech difficulty No syncope   Current diet: in general, an "unhealthy" diet Current exercise: gardening, housecleaning, and walking  The 10-year ASCVD risk score (Arnett DK, et al., 2019) is: 27.1%  ---------------------------------------------------------------------------------------------------   Medications: Outpatient Medications Prior to Visit  Medication Sig   albuterol (VENTOLIN HFA) 108 (90 Base) MCG/ACT inhaler Inhale 2  puffs into the lungs every 6 (six) hours as needed for wheezing or shortness of breath.   aspirin EC 81 MG tablet Take 81 mg by mouth every evening.   calcium carbonate (TUMS - DOSED IN MG ELEMENTAL CALCIUM) 500 MG chewable tablet Chew 1 tablet by mouth as needed for indigestion or heartburn.   Omega-3 Fatty Acids (FISH OIL) 1000 MG CPDR Take 1 capsule by mouth every evening.    [DISCONTINUED] azithromycin (ZITHROMAX Z-PAK) 250 MG tablet Take 2 tabs today, then 1 tab until gone   [DISCONTINUED] predniSONE (DELTASONE) 10 MG tablet Take 4 tablets X 3 days, 3 tabs X 3 days, 2 tabs x 3 days, 1 tab x 3 days   [DISCONTINUED] Spacer/Aero-Holding Chambers (AEROCHAMBER MV) inhaler Use as instructed   No facility-administered medications prior to visit.    Review of Systems  Constitutional:  Negative for fatigue and fever.  Respiratory:  Negative for cough and shortness of breath.   Cardiovascular:  Negative for chest pain, palpitations and leg swelling.  Genitourinary:        Erectile dysfunction  Neurological:  Negative for dizziness and headaches.      Objective    BP 136/82 (BP Location: Right Arm, Patient Position: Sitting, Cuff Size: Normal)   Pulse 60   Resp 16   Ht 6' (1.829 m)   Wt 203 lb (92.1 kg)   SpO2 97%   BMI 27.53 kg/m   Physical Exam Constitutional:      General: He is awake.     Appearance: He is well-developed.  HENT:     Head: Normocephalic.  Eyes:     Conjunctiva/sclera: Conjunctivae normal.  Cardiovascular:     Rate and Rhythm: Normal rate and regular rhythm.     Heart sounds: Normal heart sounds.  Pulmonary:     Effort: Pulmonary effort is normal.     Breath sounds: Normal breath sounds.  Skin:    General: Skin is warm.  Neurological:     Mental Status: He is alert and oriented to person, place, and time.  Psychiatric:        Attention and Perception: Attention normal.        Mood and Affect: Mood normal.        Speech: Speech normal.        Behavior:  Behavior is cooperative.      No results found for any visits on 05/02/22.  Assessment & Plan     Problem List Items Addressed This Visit       Other   Hypercholesterolemia with hypertriglyceridemia - Primary    Again discussed importance of statin in protection of heart attack and stroke Pt has risk factors of aortic atherosclerosis, smoking, age, hyperlipidemia The 10-year ASCVD risk score (Arnett DK, et al., 2019) is: 27.1%  Will repeat lipids fasting as pt wants to keep watching, but he continues to decline statin      Relevant Medications   tadalafil (CIALIS) 10 MG tablet   Other Relevant Orders   Lipid Profile   Comprehensive Metabolic Panel (CMET)   Erectile dysfunction    Explained potential etiology, importance again of statin medications.  Advised trial of Cialis but if no improvement would refer to urology      Relevant Medications   tadalafil (CIALIS) 10 MG tablet    Return in about 6 months (around 11/01/2022) for chronic conditions.      I, [name], PA-C have reviewed all documentation for this visit. The documentation on  05/02/2022 for the exam, diagnosis, procedures, and orders are all accurate and complete.  [name], PA-C [facility] [address] [place], [place], [address] Office: [phone] Fax: [place]

## 2022-05-02 NOTE — Assessment & Plan Note (Signed)
Again discussed importance of statin in protection of heart attack and stroke Pt has risk factors of aortic atherosclerosis, smoking, age, hyperlipidemia The 10-year ASCVD risk score (Arnett DK, et al., 2019) is: 27.1%  Will repeat lipids fasting as pt wants to keep watching, but he continues to decline statin

## 2022-05-02 NOTE — Assessment & Plan Note (Signed)
Explained potential etiology, importance again of statin medications.  Advised trial of Cialis but if no improvement would refer to urology

## 2022-05-03 LAB — COMPREHENSIVE METABOLIC PANEL WITH GFR
ALT: 17 IU/L (ref 0–44)
AST: 21 IU/L (ref 0–40)
Albumin/Globulin Ratio: 2.1 (ref 1.2–2.2)
Albumin: 4.4 g/dL (ref 3.9–4.9)
Alkaline Phosphatase: 141 IU/L — ABNORMAL HIGH (ref 44–121)
BUN/Creatinine Ratio: 10 (ref 10–24)
BUN: 10 mg/dL (ref 8–27)
Bilirubin Total: 0.5 mg/dL (ref 0.0–1.2)
CO2: 23 mmol/L (ref 20–29)
Calcium: 9.3 mg/dL (ref 8.6–10.2)
Chloride: 107 mmol/L — ABNORMAL HIGH (ref 96–106)
Creatinine, Ser: 1.03 mg/dL (ref 0.76–1.27)
Globulin, Total: 2.1 g/dL (ref 1.5–4.5)
Glucose: 88 mg/dL (ref 70–99)
Potassium: 4.7 mmol/L (ref 3.5–5.2)
Sodium: 144 mmol/L (ref 134–144)
Total Protein: 6.5 g/dL (ref 6.0–8.5)
eGFR: 79 mL/min/1.73

## 2022-05-03 LAB — LIPID PANEL
Chol/HDL Ratio: 5.8 ratio — ABNORMAL HIGH (ref 0.0–5.0)
Cholesterol, Total: 210 mg/dL — ABNORMAL HIGH (ref 100–199)
HDL: 36 mg/dL — ABNORMAL LOW
LDL Chol Calc (NIH): 156 mg/dL — ABNORMAL HIGH (ref 0–99)
Triglycerides: 97 mg/dL (ref 0–149)
VLDL Cholesterol Cal: 18 mg/dL (ref 5–40)

## 2022-08-27 DIAGNOSIS — L821 Other seborrheic keratosis: Secondary | ICD-10-CM | POA: Diagnosis not present

## 2022-08-27 DIAGNOSIS — D2261 Melanocytic nevi of right upper limb, including shoulder: Secondary | ICD-10-CM | POA: Diagnosis not present

## 2022-08-27 DIAGNOSIS — D2271 Melanocytic nevi of right lower limb, including hip: Secondary | ICD-10-CM | POA: Diagnosis not present

## 2022-08-27 DIAGNOSIS — D2272 Melanocytic nevi of left lower limb, including hip: Secondary | ICD-10-CM | POA: Diagnosis not present

## 2022-08-27 DIAGNOSIS — D225 Melanocytic nevi of trunk: Secondary | ICD-10-CM | POA: Diagnosis not present

## 2022-08-27 DIAGNOSIS — D2262 Melanocytic nevi of left upper limb, including shoulder: Secondary | ICD-10-CM | POA: Diagnosis not present

## 2022-09-12 ENCOUNTER — Encounter: Payer: Self-pay | Admitting: Emergency Medicine

## 2022-09-23 ENCOUNTER — Ambulatory Visit: Payer: Medicare HMO | Admitting: Emergency Medicine

## 2022-09-29 ENCOUNTER — Ambulatory Visit
Admission: RE | Admit: 2022-09-29 | Discharge: 2022-09-29 | Disposition: A | Discharge status: Home / Self Care | Payer: Medicare HMO | Source: Ambulatory Visit | Attending: Acute Care | Admitting: Acute Care

## 2022-09-29 DIAGNOSIS — Z122 Encounter for screening for malignant neoplasm of respiratory organs: Secondary | ICD-10-CM

## 2022-09-29 DIAGNOSIS — Z87891 Personal history of nicotine dependence: Secondary | ICD-10-CM

## 2022-09-29 DIAGNOSIS — F17210 Nicotine dependence, cigarettes, uncomplicated: Secondary | ICD-10-CM

## 2022-09-29 DIAGNOSIS — J439 Emphysema, unspecified: Secondary | ICD-10-CM | POA: Diagnosis not present

## 2022-10-05 ENCOUNTER — Other Ambulatory Visit: Payer: Self-pay | Admitting: Acute Care

## 2022-10-05 DIAGNOSIS — F17210 Nicotine dependence, cigarettes, uncomplicated: Secondary | ICD-10-CM

## 2022-10-05 DIAGNOSIS — Z87891 Personal history of nicotine dependence: Secondary | ICD-10-CM

## 2022-10-05 DIAGNOSIS — Z122 Encounter for screening for malignant neoplasm of respiratory organs: Secondary | ICD-10-CM

## 2022-10-06 ENCOUNTER — Emergency Department: Payer: Medicare HMO

## 2022-10-06 ENCOUNTER — Ambulatory Visit: Payer: Self-pay | Admitting: *Deleted

## 2022-10-06 ENCOUNTER — Other Ambulatory Visit: Payer: Self-pay | Admitting: Physician Assistant

## 2022-10-06 ENCOUNTER — Encounter: Payer: Self-pay | Admitting: Physician Assistant

## 2022-10-06 ENCOUNTER — Emergency Department
Admission: EM | Admit: 2022-10-06 | Discharge: 2022-10-06 | Disposition: A | Discharge status: Home / Self Care | Payer: Medicare HMO | Attending: Emergency Medicine | Admitting: Emergency Medicine

## 2022-10-06 ENCOUNTER — Telehealth: Payer: Self-pay | Admitting: Cardiology

## 2022-10-06 ENCOUNTER — Other Ambulatory Visit: Payer: Self-pay

## 2022-10-06 DIAGNOSIS — R000 Tachycardia, unspecified: Secondary | ICD-10-CM | POA: Diagnosis not present

## 2022-10-06 DIAGNOSIS — R42 Dizziness and giddiness: Secondary | ICD-10-CM | POA: Insufficient documentation

## 2022-10-06 DIAGNOSIS — I10 Essential (primary) hypertension: Secondary | ICD-10-CM | POA: Insufficient documentation

## 2022-10-06 DIAGNOSIS — I6523 Occlusion and stenosis of bilateral carotid arteries: Secondary | ICD-10-CM | POA: Diagnosis not present

## 2022-10-06 DIAGNOSIS — J439 Emphysema, unspecified: Secondary | ICD-10-CM | POA: Diagnosis not present

## 2022-10-06 DIAGNOSIS — R079 Chest pain, unspecified: Secondary | ICD-10-CM | POA: Diagnosis not present

## 2022-10-06 DIAGNOSIS — H538 Other visual disturbances: Secondary | ICD-10-CM | POA: Diagnosis not present

## 2022-10-06 DIAGNOSIS — M8580 Other specified disorders of bone density and structure, unspecified site: Secondary | ICD-10-CM | POA: Insufficient documentation

## 2022-10-06 DIAGNOSIS — R0602 Shortness of breath: Secondary | ICD-10-CM | POA: Insufficient documentation

## 2022-10-06 LAB — BASIC METABOLIC PANEL
Anion gap: 9 (ref 5–15)
BUN: 17 mg/dL (ref 8–23)
CO2: 22 mmol/L (ref 22–32)
Calcium: 9 mg/dL (ref 8.9–10.3)
Chloride: 109 mmol/L (ref 98–111)
Creatinine, Ser: 0.98 mg/dL (ref 0.61–1.24)
GFR, Estimated: >60 mL/min (ref >60)
Glucose, Bld: 114 mg/dL — ABNORMAL HIGH (ref 70–99)
Potassium: 3.4 mmol/L — ABNORMAL LOW (ref 3.5–5.1)
Sodium: 140 mmol/L (ref 135–145)

## 2022-10-06 LAB — CBC
HCT: 43.7 % (ref 39.0–52.0)
Hemoglobin: 15.5 g/dL (ref 13.0–17.0)
MCH: 34.9 pg — ABNORMAL HIGH (ref 26.0–34.0)
MCHC: 35.5 g/dL (ref 30.0–36.0)
MCV: 98.4 fL (ref 80.0–100.0)
Platelets: 246 10*3/uL (ref 150–400)
RBC: 4.44 MIL/uL (ref 4.22–5.81)
RDW: 12.2 % (ref 11.5–15.5)
WBC: 7.6 10*3/uL (ref 4.0–10.5)
nRBC: 0 % (ref 0.0–0.2)

## 2022-10-06 LAB — HEPATIC FUNCTION PANEL
ALT: 20 U/L (ref 0–44)
AST: 25 U/L (ref 15–41)
Albumin: 4.2 g/dL (ref 3.5–5.0)
Alkaline Phosphatase: 96 U/L (ref 38–126)
Bilirubin, Direct: <0.1 mg/dL (ref 0.0–0.2)
Total Bilirubin: 0.8 mg/dL (ref 0.3–1.2)
Total Protein: 6.8 g/dL (ref 6.5–8.1)

## 2022-10-06 LAB — TROPONIN I (HIGH SENSITIVITY)
Troponin I (High Sensitivity): 6 ng/L (ref <18)
Troponin I (High Sensitivity): 5 ng/L (ref <18)

## 2022-10-06 LAB — BRAIN NATRIURETIC PEPTIDE: B Natriuretic Peptide: 182 pg/mL — ABNORMAL HIGH (ref 0.0–100.0)

## 2022-10-06 LAB — LIPASE, BLOOD: Lipase: 33 U/L (ref 11–51)

## 2022-10-06 MED ORDER — SODIUM CHLORIDE 0.9 % IV BOLUS
1000.0000 mL | Freq: Once | INTRAVENOUS | Status: AC
  Administered 2022-10-06: 1000 mL via INTRAVENOUS

## 2022-10-06 MED ORDER — IOHEXOL 350 MG/ML SOLN
100.0000 mL | Freq: Once | INTRAVENOUS | Status: AC | PRN
  Administered 2022-10-06: 100 mL via INTRAVENOUS

## 2022-10-06 NOTE — ED Provider Triage Note (Signed)
Emergency Medicine Provider Triage Evaluation Note  Thomas Cross, a 67 y.o. male  was evaluated in triage.  Pt complains of dizziness for 1 week.  He is also door saying some heart palpitations.  Patient would also endorse some left-sided chest wall pain denies any diaphoresis, nausea, vomiting, or dizziness.  Denies any syncope, head injury, or illness.  Review of Systems  Positive: Dizziness, chest pain Negative: Syncope  Physical Exam  BP (!) 179/81   Pulse 63   Temp 97.6 F (36.4 C) (Oral)   Resp 18   SpO2 95%  Gen:   Awake, no distress  NAD Resp:  Normal effort CTA MSK:   Moves extremities without difficulty  Other:    Medical Decision Making  Medically screening exam initiated at 4:00 PM.  Appropriate orders placed.  INGRID YANIK was informed that the remainder of the evaluation will be completed by another provider, this initial triage assessment does not replace that evaluation, and the importance of remaining in the ED until their evaluation is complete.  Patient to the ED for evaluation of intermittent dizziness for the last week.  Also reports some intermittent chest pain as well.   Lissa Hoard, PA-C 10/06/22 1601

## 2022-10-06 NOTE — Telephone Encounter (Signed)
  Chief Complaint: Daughter Warren Lacy calling in concerned about her father.   She has taken over his care.  (DPR)  He is not with her Symptoms: He is having blurred vision and dizziness for a week now.   He is also being woke up during the night with his heart beating real hard.   He drinks Mtn. Dew and liquor a lot.   He is a heavy smoker too.  Frequency: For a week now Pertinent Negatives: Patient denies stroke history. Disposition: [x] ED /[] Urgent Care (no appt availability in office) / [] Appointment(In office/virtual)/ []  [facility]/ [] Home Care/ [] Refused Recommended Disposition /[] [facility]/ []  Follow-up with PCP Additional Notes: I have referred him to the ED due to his symptoms.     Judeth Cornfield said she is going to take him to [facility] in [place].  She feels he needs a heart cath.  I let her know he would need to be evaluated first.    They are leaving town for the [employer]. And she was hoping he could be evaluated prior to their vacation plans.  Daughter said she also called his cardiologist but they can't see him until next week.   Message sent to Alfredia Ferguson, PA-C.

## 2022-10-06 NOTE — ED Provider Notes (Signed)
[facility] Provider Note  Patient Contact: 6:44 PM (approximate)   History   Dizziness   HPI  Thomas Cross is a 67 y.o. male with a history of GERD, arthritis and sleep apnea, presents to the emergency department with multiple constitutional symptoms.  Patient is primarily concerned for 2 weeks of right eye blurry vision and dizziness that is not provoked with changes in position.  Patient states that his dizziness feels like a sensation of swimmy headedness without the room spinning around him.  He states that he has occasional headache and pain radiates along the right aspect of his neck that does seem to be reproduced with range of motion.  Patient is very concerned about his heart and states that he has had some intermittent shortness of breath and can feel his pulse when he is sleeping.  Patient states that he often awakens to the sensation.  He denies nausea, vomiting or abdominal pain.  He has not started any new medications.  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  No fever at home.      Physical Exam   Triage Vital Signs: ED Triage Vitals  Enc Vitals Group     BP 10/06/22 1553 (!) 179/81     Pulse Rate 10/06/22 1551 63     Resp 10/06/22 1551 18     Temp 10/06/22 1551 97.6 F (36.4 C)     Temp Source 10/06/22 1551 Oral     SpO2 10/06/22 1551 95 %     Weight --      Height --      Head Circumference --      Peak Flow --      Pain Score 10/06/22 1553 0     Pain Loc --      Pain Edu? --      Excl. in GC? --     Most recent vital signs: Vitals:   10/06/22 1553 10/06/22 1948  BP: (!) 179/81 (!) 168/95  Pulse:    Resp:  17  Temp:  98.2 F (36.8 C)  SpO2:       General: Alert and in no acute distress. Eyes:  PERRL. EOMI. Head: No acute traumatic findings ENT:      Nose: No congestion/rhinnorhea.      Mouth/Throat: Mucous membranes are moist. Neck: No stridor. No  cervical spine tenderness to palpation. Cardiovascular:  Good peripheral perfusion Respiratory: Normal respiratory effort without tachypnea or retractions. Lungs CTAB. Good air entry to the bases with no decreased or absent breath sounds. Gastrointestinal: Bowel sounds 4 quadrants. Soft and nontender to palpation. No guarding or rigidity. No palpable masses. No distention. No CVA tenderness. Musculoskeletal: Symmetric strength in the upper and lower extremities.  Full range of motion to all extremities.  Neurologic: No cranial nerve deficits.  No gross focal neurologic deficits are appreciated.  No antalgic gait.  Patient can easily perform hand to nose. Skin:   No rash noted    ED Results / Procedures / Treatments   Labs (all labs ordered are listed, but only abnormal results are displayed) Labs Reviewed  BASIC METABOLIC PANEL - Abnormal; Notable for the following components:      Result Value   Potassium 3.4 (*)    Glucose, Bld 114 (*)    All other components within normal limits  CBC - Abnormal; Notable for the following components:   [facility]  34.9 (*)    All other components within normal limits  BRAIN NATRIURETIC PEPTIDE - Abnormal; Notable for the following components:   B Natriuretic Peptide 182.0 (*)    All other components within normal limits  HEPATIC FUNCTION PANEL  LIPASE, BLOOD  TROPONIN I (HIGH SENSITIVITY)  TROPONIN I (HIGH SENSITIVITY)     EKG  Normal sinus rhythm without ST segment elevation or other apparent arrhythmia.   RADIOLOGY  I personally viewed and evaluated these images as part of my medical decision making, as well as reviewing the written report by the radiologist.  ED Provider Interpretation: Chest x-ray unremarkable.   PROCEDURES:  Critical Care performed: No  Procedures   MEDICATIONS ORDERED IN ED: Medications  sodium chloride 0.9 % bolus 1,000 mL (0 mLs Intravenous Stopped 10/06/22 2042)  iohexol (OMNIPAQUE) 350 MG/ML injection 100 mL  (100 mLs Intravenous Contrast Given 10/06/22 1858)     IMPRESSION / MDM / ASSESSMENT AND PLAN / ED COURSE  I reviewed the triage vital signs and the nursing notes.                              Assessment and plan Dizziness 67 year old male presents to the emergency department with dizziness described as a swimming lightheadedness for the past 2 weeks as well as right eye blurry vision.  Patient is also complaining of intermittent headache, and neck pain and shortness of breath.  Patient was hypertensive at triage but vital signs were otherwise reassuring.  On exam, patient was alert and nontoxic-appearing with no increased work of breathing.  He did have good breath sounds in the lung bases bilaterally.  Differential diagnosis includes intracranial bleed, dissection, aneurysm, PE...  CTA of the chest shows no evidence of PE.  CT angio head and neck without acute abnormality.  BNP mildly elevated at 182.  CBC, BMP, lipase and troponin within range.  Hepatic function panel reassuring.  Delta troponin within range.  After lab results return, I reevaluated patient's gait.  He was able to perform hand to nose and rapid alternating movements easily.  Recommended outpatient follow-up with cardiology and patient and family members feel comfortable with this plan.      FINAL CLINICAL IMPRESSION(S) / ED DIAGNOSES   Final diagnoses:  Dizziness     Rx / DC Orders   ED Discharge Orders     None        Note:  This document was prepared using Dragon voice recognition software and may include unintentional dictation errors.   Pia Mau [place], Cordelia Poche 10/06/22 2149    Concha Se, MD 10/08/22 [phone]

## 2022-10-06 NOTE — Telephone Encounter (Signed)
STAT if patient feels like he/she is going to faint   Are you dizzy now? S/W Patient's daughter, stated it is consistent   Do you feel faint or have you passed out? No  Do you have any other symptoms? Heart has been waking him up in the middle of the night, heart beats hard, patient feels fatigue, blurred vision  Have you checked your HR and BP (record if available)? No  Patient's daughter is calling because the patient is having issues with dizziness and blurred vision. Patient's daughter states that the patient is not having signs of stroke, but he does feel fatigue and is waking up in the middle of the night from his heart beating hard. Patient's daughter stated that the dizziness is very consistent. Patient's daughter stated we can call her at [phone] or we can call the patient to go over the symptoms at [phone]. Please advise.

## 2022-10-06 NOTE — Telephone Encounter (Signed)
Reason for Disposition  Dizziness, lightheadedness, or weakness    Dizziness, blurred vision and heart beating hard especially at night.   It wakes him up.  Answer Assessment - Initial Assessment Questions 1. DESCRIPTION: "Please describe your heart rate or heartbeat that you are having" (e.g., fast/slow, regular/irregular, skipped or extra beats, "palpitations")     My daddy.   Daughter calling dizziness, blurred vision and fast heart rate for a week now.   The first responders checked him out and he was fine.   He had a CT lung last week. 2. ONSET: "When did it start?" (Minutes, hours or days)      For a week now.    No stroke symptoms I see.   His heart is beating hard and it wakes him up at night.   It's not irregular.   He is a heavy smoker.     He is not with me.   He called me this morning.   His cardiologist is not available until next week.    I think he needs a heart cath.    He wore a heart monitor for a week.   He drinks and smokes.    3. DURATION: "How long does it last" (e.g., seconds, minutes, hours)     It wakes him up during the night.   Feels like his heart is beating heard especially at night.    He still works a hard job.   He drinks liquor and Mtn Dew. 4. PATTERN "Does it come and go, or has it been constant since it started?"  "Does it get worse with exertion?"   "Are you feeling it now?"     He needs to be checked.    We are leaving town next [place] for [employer].  5. TAP: "Using your hand, can you tap out what you are feeling on a chair or table in front of you, so that I can hear?" (Note: not all patients can do this)       Not asked 6. HEART RATE: "Can you tell me your heart rate?" "How many beats in 15 seconds?"  (Note: not all patients can do this)       He is not with her. 7. RECURRENT SYMPTOM: "Have you ever had this before?" If Yes, ask: "When was the last time?" and "What happened that time?"      I took over their health care, daughter. 8. CAUSE: "What do you think  is causing the palpitations?"     I don't know 9. CARDIAC HISTORY: "Do you have any history of heart disease?" (e.g., heart attack, angina, bypass surgery, angioplasty, arrhythmia)      He sees a cardiologist.   He has a history of palpitations.    His CT scan and EKGs are normal. 10. OTHER SYMPTOMS: "Do you have any other symptoms?" (e.g., dizziness, chest pain, sweating, difficulty breathing)       Dizziness and blurred vision and heart beating hard going on for a week.  BP, sugar and oxygen is normal.   Wed. The fire fighters came over and checked.   [place]   Daughter is a CMA.    They think he is dehydrated and needs to drink water.   The fire fighters came out the same day he had the lung CT scan as part of a cancer research study.      11. PREGNANCY: "Is there  any chance you are pregnant?" "When was your last menstrual period?"       N/A  Protocols used: Heart Rate and Heartbeat Questions-A-AH

## 2022-10-06 NOTE — Telephone Encounter (Signed)
Spoke with patient's daughter Judeth Cornfield and she stated that the patient has been waking up with dizziness and blurred vision along with a "pounding" heartbeat. Informed patient's daughter that he has been diagnosed with palpitations and obstructive sleep apnea couple with current smoking that can prevent a full restful night's sleep. Informed her that we had an appointment on 10/16/22 but with the blurred vision he should seek medical attention sooner such as the ED or urgent care. Patient's daughter stated she just wanted him to have a echocardiogram or heart cath. Informed her that the patient would have to be evaluated in order to have those procedures ordered if deem appropriate. She stated that they would go to urgent care or ED since they cannot wait much longer with holiday plans coming up.

## 2022-10-06 NOTE — ED Notes (Signed)
Patient provided drink per request.

## 2022-10-06 NOTE — Telephone Encounter (Signed)
Noted, yes I think this is very appropriate to go to ED

## 2022-10-06 NOTE — Discharge Instructions (Signed)
Please make appointment to see cardiology, Dr. Juliann Pares.

## 2022-10-06 NOTE — ED Triage Notes (Signed)
Pt presents to ED with c/o of dizziness for the past week. Pt also states "I felt my heart pounding". Pt states he was hoping it would go away. Pt denies N/V/D. Pt states generalized weakness. Pt is A&Ox4.

## 2022-10-07 ENCOUNTER — Encounter: Payer: Self-pay | Admitting: Physician Assistant

## 2022-10-08 IMAGING — CT CT CHEST LUNG CANCER SCREENING LOW DOSE W/O CM
2 of 5 series · 15 of 40 positions shown, 18 images · non-contrast
Comparison: Current smoker with 73 pack-yearhistory

CLINICAL DATA: Current smoker 69 pk yr



[Series 3: lung 1.00 · axial · 0.77mm/px · z∈[-1263,-922]mm · 12 of 377 slices shown, 15 images]
[im 18/377  mediastinal]
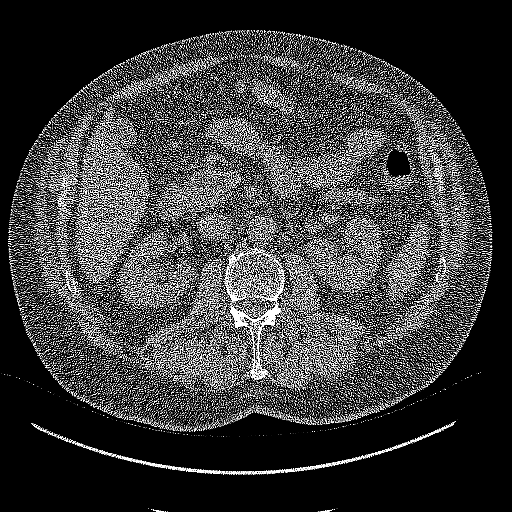
[im 18/377  lung]
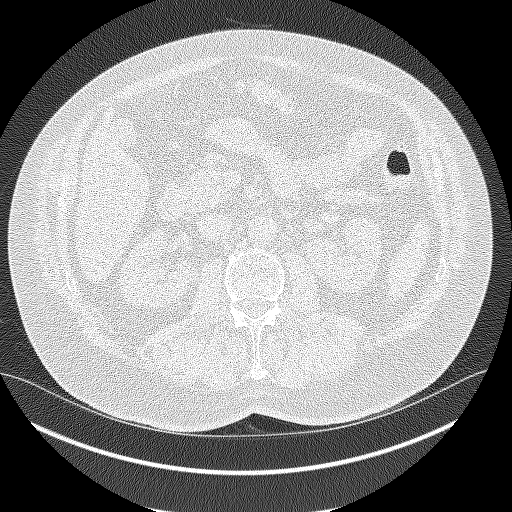
[im 52/377  lung]
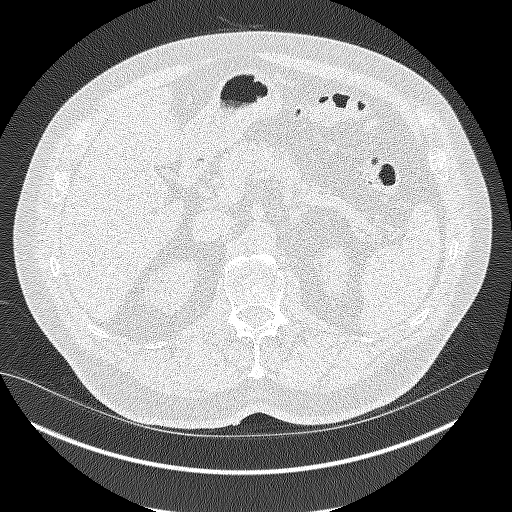
[im 86/377  lung]
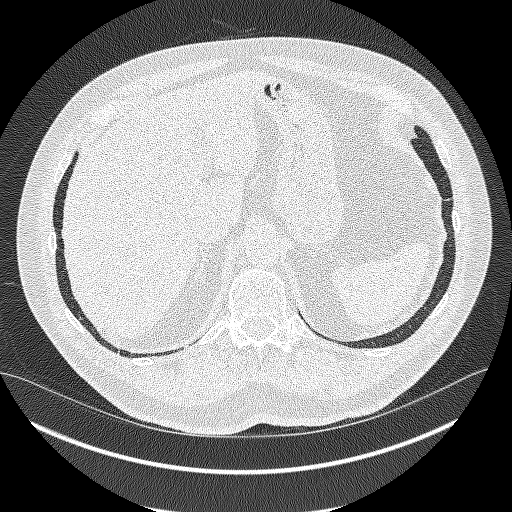
[im 120/377  lung]
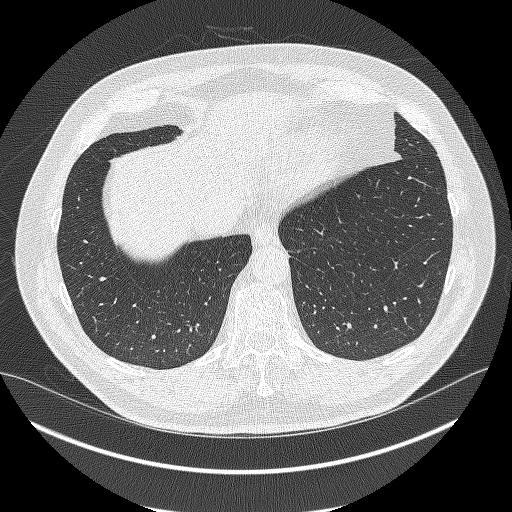
[im 137/377  mediastinal]
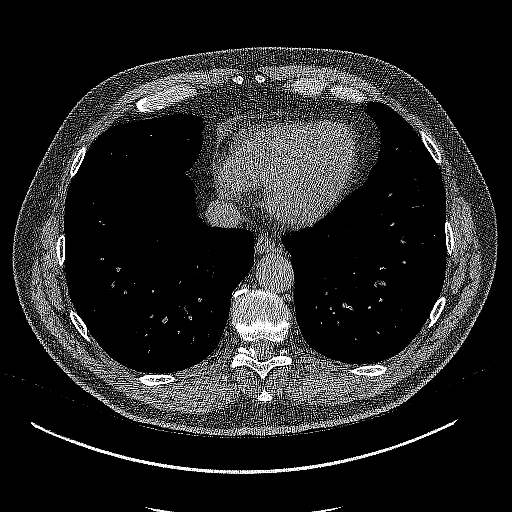
[im 137/377  lung]
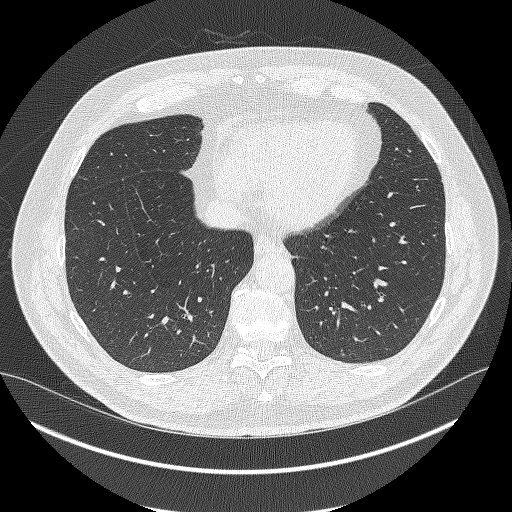
[im 171/377  lung]
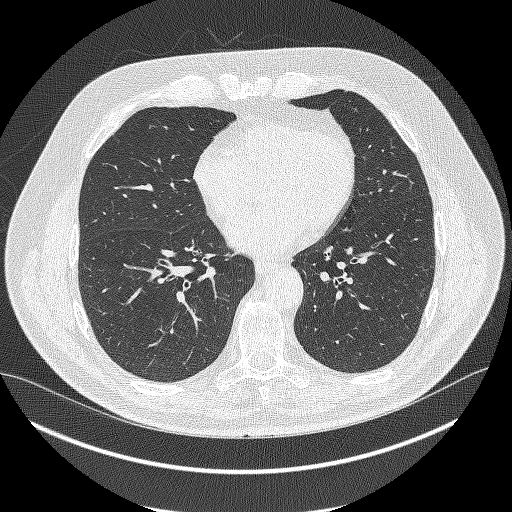
[im 206/377  lung]
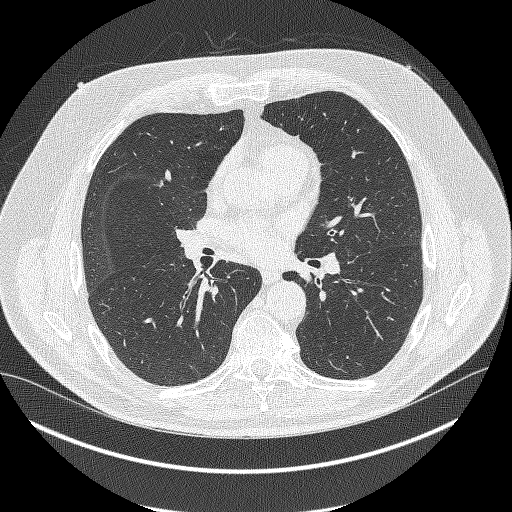
[im 240/377  lung]
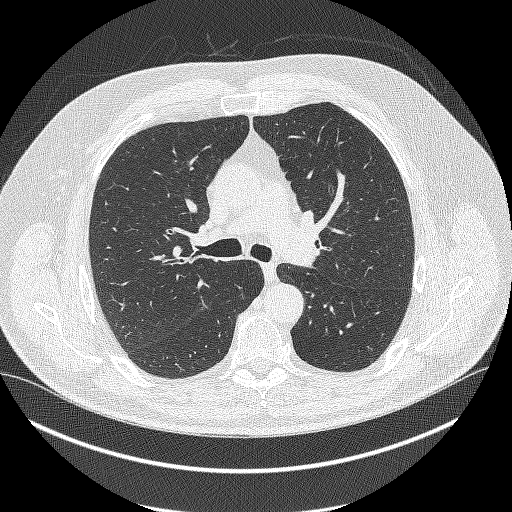
[im 257/377  mediastinal]
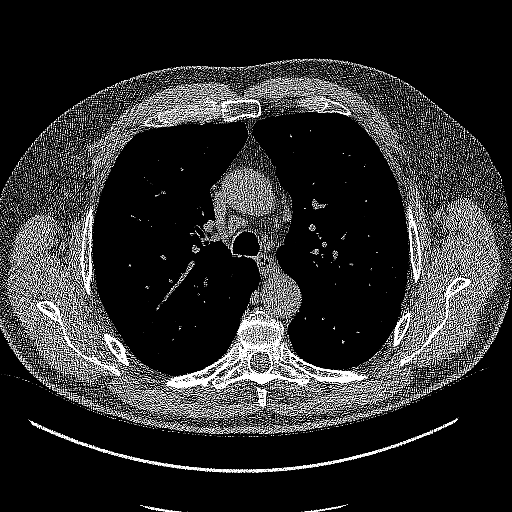
[im 257/377  lung]
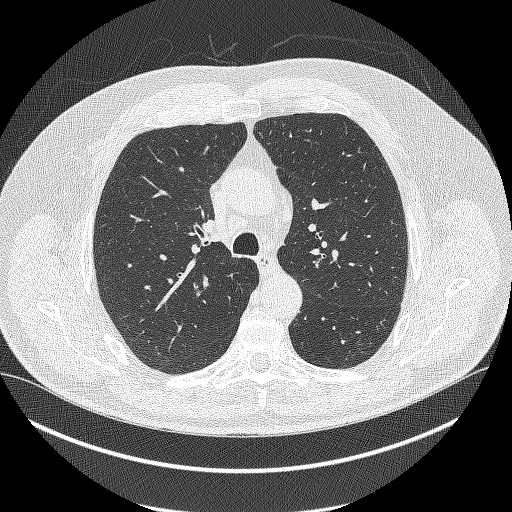
[im 291/377  lung]
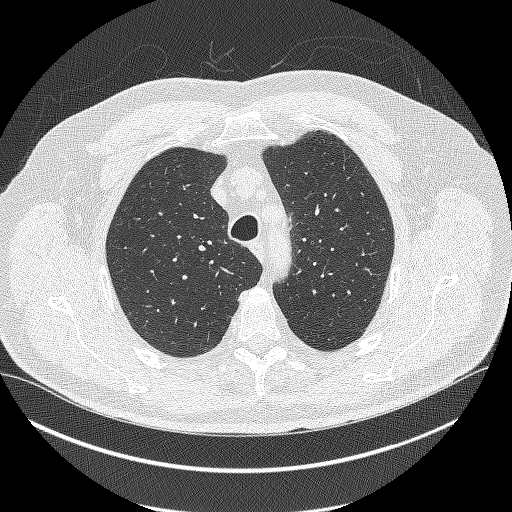
[im 325/377  lung]
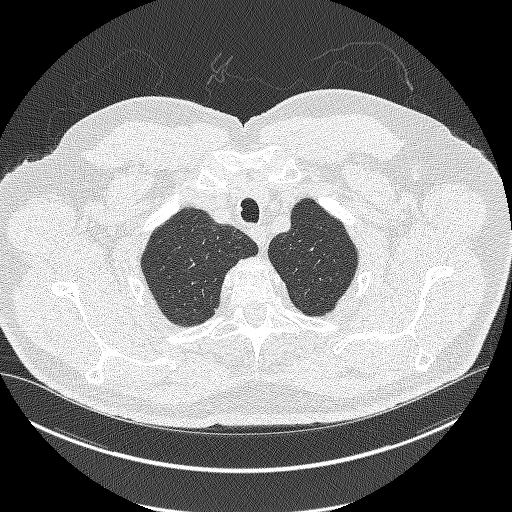
[im 359/377  lung]
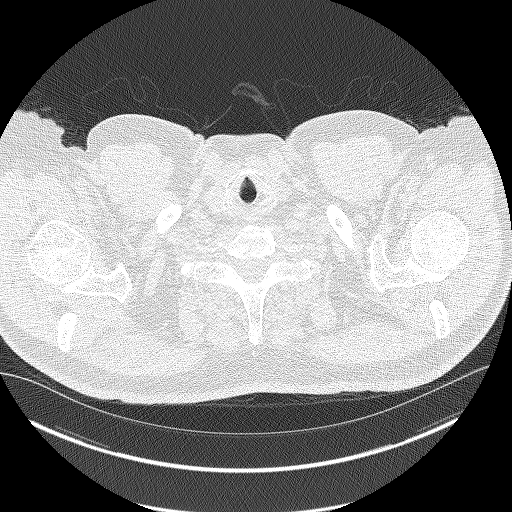

[Series 5: coronals lung 1.00 cor · coronal · 0.74mm/px · 3 of 392 slices shown]
[im 79/392  lung]
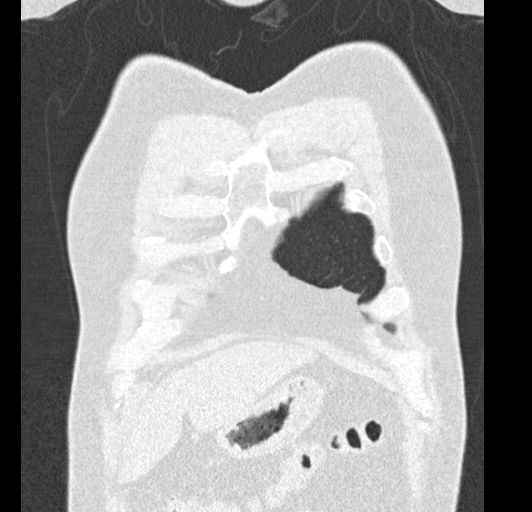
[im 157/392  lung]
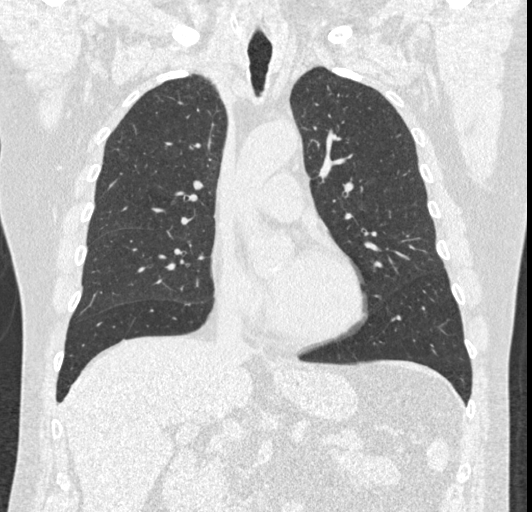
[im 235/392  lung]
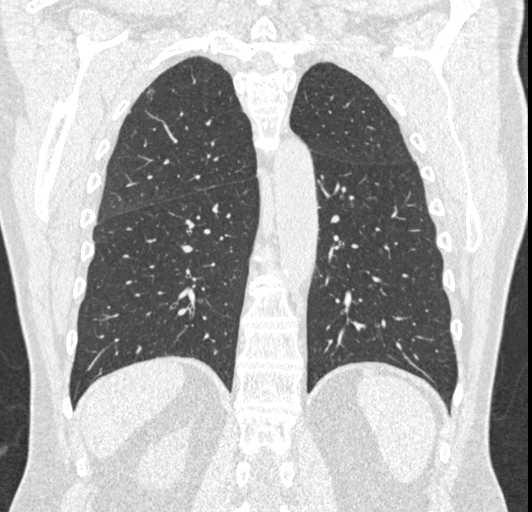

[15 of 40 positions shown; findings below may reference images not displayed]

FINDINGS: Cardiovascular: Normal heart size. Pericardial effusion. Normal
caliber thoracic aorta with mild atherosclerotic disease.

Mediastinum/Nodes: Esophagus and thyroid are unremarkable. No
pathologically enlarged nodes seen in the chest.

Lungs/Pleura: Central airways are patent. No consolidation, pleural
effusion or pneumothorax. Part solid nodule of the right upper lobe
with 4.6 mm solid component and 11.6 mm overall diameter, previously
ground-glass and measured 5 mm.

Upper Abdomen: No acute abnormality.

Musculoskeletal: No chest wall mass or suspicious bone lesions
identified.
IMPRESSION: 1. Part solid nodule of the right upper lobe with new solid
component measuring 4.6 mm. Lung-RADS 4B, suspicious. Additional
imaging evaluation or consultation with Pulmonology or Thoracic
Surgery recommended.
2. Aortic Atherosclerosis (CWJWC-WIL.L) and Emphysema ([id number]-GVT.Z).

These results will be called to the ordering clinician or
representative by the Radiologist Assistant, and communication
documented in the PACS or [REDACTED]. See

## 2022-10-22 ENCOUNTER — Ambulatory Visit (INDEPENDENT_AMBULATORY_CARE_PROVIDER_SITE_OTHER): Payer: Medicare HMO

## 2022-10-22 VITALS — Ht 72.0 in | Wt 200.0 lb

## 2022-10-22 DIAGNOSIS — Z0000 Encounter for general adult medical examination without abnormal findings: Secondary | ICD-10-CM | POA: Diagnosis not present

## 2022-10-22 NOTE — Patient Instructions (Signed)
Thomas Cross , Thank you for taking time to come for your Medicare Wellness Visit. I appreciate your ongoing commitment to your health goals. Please review the following plan we discussed and let me know if I can assist you in the future.   These are the goals we discussed:  Goals      DIET - EAT MORE FRUITS AND VEGETABLES        This is a list of the screening recommended for you and due dates:  Health Maintenance  Topic Date Due   COVID-19 Vaccine (1) Never done   Zoster (Shingles) Vaccine (1 of 2) Never done   Pneumonia Vaccine (2 of 2 - PCV) 11/07/2020   DTaP/Tdap/Td vaccine (2 - Td or Tdap) 03/17/2021   Flu Shot  12/18/2022   Screening for Lung Cancer  10/06/2023   Medicare Annual Wellness Visit  10/22/2023   Colon Cancer Screening  07/28/2027   Hepatitis C Screening  Completed   HPV Vaccine  Aged Out    Advanced directives: no  Conditions/risks identified: none  Next appointment: Follow up in one year for your annual wellness visit. 10/26/2023 @ 8:45 am telephone  Preventive Care 65 Years and Older, Male  Preventive care refers to lifestyle choices and visits with your health care provider that can promote health and wellness. What does preventive care include? A yearly physical exam. This is also called an annual well check. Dental exams once or twice a year. Routine eye exams. Ask your health care provider how often you should have your eyes checked. Personal lifestyle choices, including: Daily care of your teeth and gums. Regular physical activity. Eating a healthy diet. Avoiding tobacco and drug use. Limiting alcohol use. Practicing safe sex. Taking low doses of aspirin every day. Taking vitamin and mineral supplements as recommended by your health care provider. What happens during an annual well check? The services and screenings done by your health care provider during your annual well check will depend on your age, overall health, lifestyle risk factors, and  family history of disease. Counseling  Your health care provider may ask you questions about your: Alcohol use. Tobacco use. Drug use. Emotional well-being. Home and relationship well-being. Sexual activity. Eating habits. History of falls. Memory and ability to understand (cognition). Work and work Astronomer. Screening  You may have the following tests or measurements: Height, weight, and BMI. Blood pressure. Lipid and cholesterol levels. These may be checked every 5 years, or more frequently if you are over 45 years old. Skin check. Lung cancer screening. You may have this screening every year starting at age 59 if you have a 30-pack-year history of smoking and currently smoke or have quit within the past 15 years. Fecal occult blood test (FOBT) of the stool. You may have this test every year starting at age 46. Flexible sigmoidoscopy or colonoscopy. You may have a sigmoidoscopy every 5 years or a colonoscopy every 10 years starting at age 50. Prostate cancer screening. Recommendations will vary depending on your family history and other risks. Hepatitis C blood test. Hepatitis B blood test. Sexually transmitted disease (STD) testing. Diabetes screening. This is done by checking your blood sugar (glucose) after you have not eaten for a while (fasting). You may have this done every 1-3 years. Abdominal aortic aneurysm (AAA) screening. You may need this if you are a current or former smoker. Osteoporosis. You may be screened starting at age 71 if you are at high risk. Talk with your health care  provider about your test results, treatment options, and if necessary, the need for more tests. Vaccines  Your health care provider may recommend certain vaccines, such as: Influenza vaccine. This is recommended every year. Tetanus, diphtheria, and acellular pertussis (Tdap, Td) vaccine. You may need a Td booster every 10 years. Zoster vaccine. You may need this after age 80. Pneumococcal  13-valent conjugate (PCV13) vaccine. One dose is recommended after age 6. Pneumococcal polysaccharide (PPSV23) vaccine. One dose is recommended after age 71. Talk to your health care provider about which screenings and vaccines you need and how often you need them. This information is not intended to replace advice given to you by your health care provider. Make sure you discuss any questions you have with your health care provider. Document Released: 06/01/2015 Document Revised: 01/23/2016 Document Reviewed: 03/06/2015 Elsevier Interactive Patient Education  2017 [employer].  Fall Prevention in the Home Falls can cause injuries. They can happen to people of all ages. There are many things you can do to make your home safe and to help prevent falls. What can I do on the outside of my home? Regularly fix the edges of walkways and driveways and fix any cracks. Remove anything that might make you trip as you walk through a door, such as a raised step or threshold. Trim any bushes or trees on the path to your home. Use bright outdoor lighting. Clear any walking paths of anything that might make someone trip, such as rocks or tools. Regularly check to see if handrails are loose or broken. Make sure that both sides of any steps have handrails. Any raised decks and porches should have guardrails on the edges. Have any leaves, snow, or ice cleared regularly. Use sand or salt on walking paths during winter. Clean up any spills in your garage right away. This includes oil or grease spills. What can I do in the bathroom? Use night lights. Install grab bars by the toilet and in the tub and shower. Do not use towel bars as grab bars. Use non-skid mats or decals in the tub or shower. If you need to sit down in the shower, use a plastic, non-slip stool. Keep the floor dry. Clean up any water that spills on the floor as soon as it happens. Remove soap buildup in the tub or shower regularly. Attach  bath mats securely with double-sided non-slip rug tape. Do not have throw rugs and other things on the floor that can make you trip. What can I do in the bedroom? Use night lights. Make sure that you have a light by your bed that is easy to reach. Do not use any sheets or blankets that are too big for your bed. They should not hang down onto the floor. Have a firm chair that has side arms. You can use this for support while you get dressed. Do not have throw rugs and other things on the floor that can make you trip. What can I do in the kitchen? Clean up any spills right away. Avoid walking on wet floors. Keep items that you use a lot in easy-to-reach places. If you need to reach something above you, use a strong step stool that has a grab bar. Keep electrical cords out of the way. Do not use floor polish or wax that makes floors slippery. If you must use wax, use non-skid floor wax. Do not have throw rugs and other things on the floor that can make you trip. What can I  do with my stairs? Do not leave any items on the stairs. Make sure that there are handrails on both sides of the stairs and use them. Fix handrails that are broken or loose. Make sure that handrails are as long as the stairways. Check any carpeting to make sure that it is firmly attached to the stairs. Fix any carpet that is loose or worn. Avoid having throw rugs at the top or bottom of the stairs. If you do have throw rugs, attach them to the floor with carpet tape. Make sure that you have a light switch at the top of the stairs and the bottom of the stairs. If you do not have them, ask someone to add them for you. What else can I do to help prevent falls? Wear shoes that: Do not have high heels. Have rubber bottoms. Are comfortable and fit you well. Are closed at the toe. Do not wear sandals. If you use a stepladder: Make sure that it is fully opened. Do not climb a closed stepladder. Make sure that both sides of the  stepladder are locked into place. Ask someone to hold it for you, if possible. Clearly mark and make sure that you can see: Any grab bars or handrails. First and last steps. Where the edge of each step is. Use tools that help you move around (mobility aids) if they are needed. These include: Canes. Walkers. Scooters. Crutches. Turn on the lights when you go into a dark area. Replace any light bulbs as soon as they burn out. Set up your furniture so you have a clear path. Avoid moving your furniture around. If any of your floors are uneven, fix them. If there are any pets around you, be aware of where they are. Review your medicines with your doctor. Some medicines can make you feel dizzy. This can increase your chance of falling. Ask your doctor what other things that you can do to help prevent falls. This information is not intended to replace advice given to you by your health care provider. Make sure you discuss any questions you have with your health care provider. Document Released: 03/01/2009 Document Revised: 10/11/2015 Document Reviewed: 06/09/2014 Elsevier Interactive Patient Education  2017 [employer].

## 2022-10-22 NOTE — Progress Notes (Signed)
I connected with  Philomena Doheny on 10/22/22 by a audio enabled telemedicine application and verified that I am speaking with the correct person using two identifiers.  Patient Location: Home  Provider Location: Office/Clinic  I discussed the limitations of evaluation and management by telemedicine. The patient expressed understanding and agreed to proceed.  Subjective:   Thomas Cross is a 67 y.o. male who presents for Medicare Annual/Subsequent preventive examination.  Review of Systems    Cardiac Risk Factors include: hypertension;advanced age (>33men, >65 women);dyslipidemia;male gender;smoking/ tobacco exposure     Objective:    Today's Vitals   10/22/22 0821  Weight: 200 lb (90.7 kg)  Height: 6' (1.829 m)   Body mass index is 27.12 kg/m.     10/22/2022    8:30 AM 07/01/2021    1:58 PM 07/27/2020    7:17 AM 06/25/2020    2:58 PM 05/09/2016   12:08 PM  Advanced Directives  Does Patient Have a Medical Advance Directive? No No No No No  Would patient like information on creating a medical advance directive?  No - Patient declined  No - Patient declined     Current Medications (verified) Outpatient Encounter Medications as of 10/22/2022  Medication Sig   albuterol (VENTOLIN HFA) 108 (90 Base) MCG/ACT inhaler Inhale 2 puffs into the lungs every 6 (six) hours as needed for wheezing or shortness of breath.   aspirin EC 81 MG tablet Take 81 mg by mouth every evening.   calcium carbonate (TUMS - DOSED IN MG ELEMENTAL CALCIUM) 500 MG chewable tablet Chew 1 tablet by mouth as needed for indigestion or heartburn.   Omega-3 Fatty Acids (FISH OIL) 1000 MG CPDR Take 1 capsule by mouth every evening.    tadalafil (CIALIS) 10 MG tablet Take 1 tablet (10 mg total) by mouth daily as needed for erectile dysfunction.   No facility-administered encounter medications on file as of 10/22/2022.    Allergies (verified) Patient has no known allergies.   History: Past Medical History:   Diagnosis Date   Arthritis    GERD (gastroesophageal reflux disease)    RARE-TUMS   Sleep apnea    Past Surgical History:  Procedure Laterality Date   COLONOSCOPY  2009   COLONOSCOPY WITH PROPOFOL N/A 07/27/2020   Procedure: COLONOSCOPY WITH PROPOFOL;  Surgeon: Wyline Mood, MD;  Location: [facility] ENDOSCOPY;  Service: Gastroenterology;  Laterality: N/A;   HERNIA REPAIR  30 years ago   right inguinal   INGUINAL HERNIA REPAIR Right 05/20/2016   Procedure: LAPAROSCOPIC INGUINAL HERNIA;  Surgeon: Kieth Brightly, MD;  Location: [facility] ORS;  Service: General;  Laterality: Right;   TONSILLECTOMY AND ADENOIDECTOMY     Family History  Problem Relation Age of Onset   Heart disease Mother    Kidney cancer Mother    Bladder Cancer Mother    Arthritis Mother    CVA Father    Prostate cancer Father    Dementia Father    Hypertension Father    Heart attack Father    Heart attack Brother    Hyperlipidemia Brother    Heart disease Maternal Grandfather    Stroke Paternal Grandmother    Colon cancer Paternal Grandfather    Hyperlipidemia Sister    Hyperlipidemia Brother    Hyperlipidemia Brother    Hyperlipidemia Brother    Social History   Socioeconomic History   Marital status: Married    Spouse name: Not on file   Number of children: 2   Years  of education: Not on file   Highest education level: High school graduate  Occupational History   Occupation: full time / owner  Tobacco Use   Smoking status: Every Day    Packs/day: 1.50    Years: 45.00    Additional pack years: 0.00    Total pack years: 67.50    Types: Cigarettes   Smokeless tobacco: Never   Tobacco comments:    30  cigarettes smoked daily [facility] 02/11/22  Vaping Use   Vaping Use: Never used  Substance and Sexual Activity   Alcohol use: Yes    Alcohol/week: 14.0 standard drinks of alcohol    Types: 14 Shots of liquor per week    Comment: 2 MIXED DRINKS EVERY DAY   Drug use: No   Sexual activity: Not on file   Other Topics Concern   Not on file  Social History Narrative   Not on file   Social Determinants of Health   Financial Resource Strain: Low Risk  (10/22/2022)   Overall Financial Resource Strain (CARDIA)    Difficulty of Paying Living Expenses: Not hard at all  Food Insecurity: No Food Insecurity (10/22/2022)   Hunger Vital Sign    Worried About Running Out of Food in the Last Year: Never true    Ran Out of Food in the Last Year: Never true  Transportation Needs: No Transportation Needs (10/22/2022)   PRAPARE - Administrator, Civil Service (Medical): No    Lack of Transportation (Non-Medical): No  Physical Activity: Sufficiently Active (10/22/2022)   Exercise Vital Sign    Days of Exercise per Week: 5 days    Minutes of Exercise per Session: 60 min  Stress: No Stress Concern Present (10/22/2022)   [employer] of Occupational Health - Occupational Stress Questionnaire    Feeling of Stress : Only a little  Social Connections: Socially Integrated (10/22/2022)   Social Connection and Isolation Panel [NHANES]    Frequency of Communication with Friends and Family: More than three times a week    Frequency of Social Gatherings with Friends and Family: More than three times a week    Attends Religious Services: More than 4 times per year    Active Member of [employer] or Organizations: Yes    Attends Engineer, structural: More than 4 times per year    Marital Status: Married    Tobacco Counseling Ready to quit: No Counseling given: Not Answered Tobacco comments: 30  cigarettes smoked daily [facility] 02/11/22   Clinical Intake:  Pre-visit preparation completed: Yes  Pain : No/denies pain     BMI - recorded: 27.12 Nutritional Status: BMI 25 -29 Overweight Nutritional Risks: None Diabetes: No  How often do you need to have someone help you when you read instructions, pamphlets, or other written materials from your doctor or pharmacy?: 1 -  Never  Diabetic?no  Interpreter Needed?: No  Comments: lives with wife Information entered by :: B.Dajanique Robley,LPN   Activities of Daily Living    10/22/2022    8:31 AM 05/02/2022    9:11 AM  In your present state of health, do you have any difficulty performing the following activities:  Hearing? 0 0  Vision? 0 0  Difficulty concentrating or making decisions? 0 0  Walking or climbing stairs? 0 0  Dressing or bathing? 0 0  Doing errands, shopping? 0 0  Preparing Food and eating ? N   Using the Toilet? N   In the past six months,  have you accidently leaked urine? N   Do you have problems with loss of bowel control? N   Managing your Medications? N   Managing your Finances? N   Housekeeping or managing your Housekeeping? N     Patient Care Team: Alfredia Ferguson, PA-C as PCP - General (Physician Assistant) Debbe Odea, MD as PCP - Cardiology (Cardiology) Blair Promise, OD (Optometry)  Indicate any recent Medical Services you may have received from other than [facility] providers in the past year (date may be approximate).     Assessment:   This is a routine wellness examination for Thomas Cross.  Hearing/Vision screen Hearing Screening - Comments:: Adequate hearing; ringing in ears Vision Screening - Comments:: Adequate vision ;just readers [facility] [place]  Dietary issues and exercise activities discussed: Current Exercise Habits: The patient has a physically strenuous job, but has no regular exercise apart from work., Exercise limited by: respiratory conditions(s)   Goals Addressed             This Visit's Progress    DIET - EAT MORE FRUITS AND VEGETABLES   On track      Depression Screen    10/22/2022    8:28 AM 05/02/2022    9:10 AM 12/20/2021    2:15 PM 07/01/2021    1:55 PM 06/13/2021    2:19 PM 09/25/2020    1:33 PM 06/25/2020    2:55 PM  PHQ 2/9 Scores  PHQ - 2 Score 0 0 0 0 0 0 0  PHQ- 9 Score  2 2 0 5 3     Fall Risk    10/22/2022     8:25 AM 05/02/2022    9:10 AM 12/20/2021    2:15 PM 07/01/2021    1:58 PM 06/13/2021    2:18 PM  Fall Risk   Falls in the past year? 0 0 0 0 0  Number falls in past yr: 0 0 0 0 0  Injury with Fall? 0 0 0 0 0  Risk for fall due to : No Fall Risks No Fall Risks No Fall Risks No Fall Risks No Fall Risks  Follow up Education provided;Falls prevention discussed Falls evaluation completed Falls evaluation completed Falls evaluation completed     FALL RISK PREVENTION PERTAINING TO THE HOME:  Any stairs in or around the home? Yes  If so, are there any without handrails? Yes  Home free of loose throw rugs in walkways, pet beds, electrical cords, etc? Yes  Adequate lighting in your home to reduce risk of falls? Yes   ASSISTIVE DEVICES UTILIZED TO PREVENT FALLS:  Life alert? No  Use of a cane, walker or w/c? No  Grab bars in the bathroom? No  Shower chair or bench in shower? Yes  Elevated toilet seat or a handicapped toilet? Yes    Cognitive Function:        10/22/2022    8:38 AM 03/11/2016    1:45 PM  6CIT Screen  What Year? 0 points 0 points  What month? 0 points 0 points  What time? 0 points 0 points  Count back from 20 0 points 0 points  Months in reverse 0 points 2 points  Repeat phrase 0 points 2 points  Total Score 0 points 4 points    Immunizations Immunization History  Administered Date(s) Administered   Pneumococcal Polysaccharide-23 11/08/2019   Tdap 03/18/2011    TDAP status: Up to date  Flu Vaccine status: Declined, Education has been provided regarding  the importance of this vaccine but patient still declined. Advised may receive this vaccine at local pharmacy or Health Dept. Aware to provide a copy of the vaccination record if obtained from local pharmacy or Health Dept. Verbalized acceptance and understanding.  Pneumococcal vaccine status: Up to date  Covid-19 vaccine status: Declined, Education has been provided regarding the importance of this vaccine but  patient still declined. Advised may receive this vaccine at local pharmacy or Health Dept.or vaccine clinic. Aware to provide a copy of the vaccination record if obtained from local pharmacy or Health Dept. Verbalized acceptance and understanding.  Qualifies for Shingles Vaccine? Yes   Zostavax completed No   Shingrix Completed?: No.    Education has been provided regarding the importance of this vaccine. Patient has been advised to call insurance company to determine out of pocket expense if they have not yet received this vaccine. Advised may also receive vaccine at local pharmacy or Health Dept. Verbalized acceptance and understanding.  Screening Tests Health Maintenance  Topic Date Due   COVID-19 Vaccine (1) Never done   Zoster Vaccines- Shingrix (1 of 2) Never done   Pneumonia Vaccine 63+ Years old (2 of 2 - PCV) 11/07/2020   DTaP/Tdap/Td (2 - Td or Tdap) 03/17/2021   INFLUENZA VACCINE  12/18/2022   Lung Cancer Screening  10/06/2023   Medicare Annual Wellness (AWV)  10/22/2023   Colonoscopy  07/28/2027   Hepatitis C Screening  Completed   HPV VACCINES  Aged Out    Health Maintenance  Health Maintenance Due  Topic Date Due   COVID-19 Vaccine (1) Never done   Zoster Vaccines- Shingrix (1 of 2) Never done   Pneumonia Vaccine 46+ Years old (2 of 2 - PCV) 11/07/2020   DTaP/Tdap/Td (2 - Td or Tdap) 03/17/2021    Colorectal cancer screening: Type of screening: Colonoscopy. Completed yes. Repeat every 5 years  Lung Cancer Screening: (Low Dose CT Chest recommended if Age 44-80 years, 30 pack-year currently smoking OR have quit w/in 15years.) does qualify.   Lung Cancer Screening Referral: no  Additional Screening:  Hepatitis C Screening: does not qualify; Completed yes  Vision Screening: Recommended annual ophthalmology exams for early detection of glaucoma and other disorders of the eye. Is the patient up to date with their annual eye exam?  Yes  Who is the provider or what is  the name of the office in which the patient attends annual eye exams? [facility] If pt is not established with a provider, would they like to be referred to a provider to establish care? No .   Dental Screening: Recommended annual dental exams for proper oral hygiene  Community Resource Referral / Chronic Care Management: CRR required this visit?  No   CCM required this visit?  No      Plan:     I have personally reviewed and noted the following in the patient's chart:   Medical and social history Use of alcohol, tobacco or illicit drugs  Current medications and supplements including opioid prescriptions. Patient is not currently taking opioid prescriptions. Functional ability and status Nutritional status Physical activity Advanced directives List of other physicians Hospitalizations, surgeries, and ER visits in previous 12 months Vitals Screenings to include cognitive, depression, and falls Referrals and appointments  In addition, I have reviewed and discussed with patient certain preventive protocols, quality metrics, and best practice recommendations. A written personalized care plan for preventive services as well as general preventive health recommendations were provided  to patient.     Sue Lush, LPN   09/20/979   Nurse Notes: The patient states he is  doing well and has no concerns or questions at this time.

## 2022-10-23 DIAGNOSIS — H2513 Age-related nuclear cataract, bilateral: Secondary | ICD-10-CM | POA: Diagnosis not present

## 2022-10-23 DIAGNOSIS — H47323 Drusen of optic disc, bilateral: Secondary | ICD-10-CM | POA: Diagnosis not present

## 2022-10-23 DIAGNOSIS — H5203 Hypermetropia, bilateral: Secondary | ICD-10-CM | POA: Diagnosis not present

## 2022-10-23 DIAGNOSIS — H52223 Regular astigmatism, bilateral: Secondary | ICD-10-CM | POA: Diagnosis not present

## 2022-10-24 ENCOUNTER — Ambulatory Visit (INDEPENDENT_AMBULATORY_CARE_PROVIDER_SITE_OTHER): Payer: Medicare HMO | Admitting: Emergency Medicine

## 2022-10-24 DIAGNOSIS — J449 Chronic obstructive pulmonary disease, unspecified: Secondary | ICD-10-CM | POA: Diagnosis not present

## 2022-10-24 LAB — PULMONARY FUNCTION TEST
DL/VA % pred: 65 %
DL/VA: 2.64 ml/min/mmHg/L
DLCO cor % pred: 64 %
DLCO cor: 17.45 ml/min/mmHg
DLCO unc % pred: 66 %
DLCO unc: 17.87 ml/min/mmHg
FEF 25-75 Post: 1.47 L/sec
FEF 25-75 Pre: 1.08 L/sec
FEF2575-%Change-Post: 36 %
FEF2575-%Pred-Post: 56 %
FEF2575-%Pred-Pre: 41 %
FEV1-%Change-Post: 7 %
FEV1-%Pred-Post: 74 %
FEV1-%Pred-Pre: 69 %
FEV1-Post: 2.55 L
FEV1-Pre: 2.37 L
FEV1FVC-%Change-Post: 8 %
FEV1FVC-%Pred-Pre: 81 %
FEV6-%Change-Post: 3 %
FEV6-%Pred-Post: 88 %
FEV6-%Pred-Pre: 85 %
FEV6-Post: 3.87 L
FEV6-Pre: 3.76 L
FEV6FVC-%Change-Post: 3 %
FEV6FVC-%Pred-Post: 104 %
FEV6FVC-%Pred-Pre: 100 %
FVC-%Change-Post: 0 %
FVC-%Pred-Post: 84 %
FVC-%Pred-Pre: 85 %
FVC-Post: 3.92 L
FVC-Pre: 3.94 L
Post FEV1/FVC ratio: 65 %
Post FEV6/FVC ratio: 99 %
Pre FEV1/FVC ratio: 60 %
Pre FEV6/FVC Ratio: 95 %
RV % pred: 116 %
RV: 2.89 L
TLC % pred: 94 %
TLC: 6.81 L

## 2022-10-24 NOTE — Progress Notes (Signed)
Full PFT completed today ? ?

## 2022-10-29 ENCOUNTER — Encounter (HOSPITAL_BASED_OUTPATIENT_CLINIC_OR_DEPARTMENT_OTHER): Payer: Medicare HMO

## 2022-10-30 ENCOUNTER — Encounter: Payer: Self-pay | Admitting: Emergency Medicine

## 2022-10-30 ENCOUNTER — Ambulatory Visit: Payer: Medicare HMO | Admitting: Emergency Medicine

## 2022-10-30 ENCOUNTER — Encounter (HOSPITAL_BASED_OUTPATIENT_CLINIC_OR_DEPARTMENT_OTHER): Payer: Medicare HMO

## 2022-10-30 VITALS — BP 126/78 | HR 56 | Temp 98.3°F | Ht 71.0 in | Wt 199.0 lb

## 2022-10-30 DIAGNOSIS — J449 Chronic obstructive pulmonary disease, unspecified: Secondary | ICD-10-CM

## 2022-10-30 DIAGNOSIS — F17200 Nicotine dependence, unspecified, uncomplicated: Secondary | ICD-10-CM | POA: Diagnosis not present

## 2022-10-30 MED ORDER — STIOLTO RESPIMAT 2.5-2.5 MCG/ACT IN AERS: 2.0000 | INHALATION_SPRAY | Freq: Every day | RESPIRATORY_TRACT | 0 refills | Status: DC

## 2022-10-30 NOTE — Patient Instructions (Signed)
We reviewed your pulmonary function testing today. We will do a trial of a medication called Stiolto.  Take 2 puffs once daily.  Keep track of how the medication helps you.  If you do get benefit then please call our office so we can send a prescription to your pharmacy. Keep your albuterol available use 2 puffs if needed for shortness of breath, chest tightness, wheezing. Work hard to decrease your cigarettes.  This will significantly benefit your breathing and slow the progression of COPD Follow-up with APP in 6 months Follow Dr. Delton Coombes in 1 year or sooner if you have problems.

## 2022-10-30 NOTE — Progress Notes (Signed)
   Subjective:    Patient ID: Thomas Cross, male    DOB: 12-24-1955, 67 y.o.   MRN: [id number]  HPI  ROV 10/30/22 --follow-up visit for 67 year old man with a history of moderate obstruction and COPD, active tobacco use (100 pack years), moderate OSA.  I have seen him for this as well as an 11 mm mixed density right upper lobe nodule that has now resolved on subsequent imaging. He has some SOB and wheeze when laying flat. He coughs and clears clear mucous every day. He was in the ED 1 month ago for dyspnea.   CT-PA 10/06/2022 reviewed by me shows emphysema, no PE, no mediastinal or hilar adenopathy.  No nodules or masses.  Pulmonary function testing 10/24/2022 reviewed by me shows moderately severe obstruction without a bronchodilator response, normal lung volumes, decreased diffusion capacity    Review of Systems As per HPi     Objective:   Physical Exam  Vitals:   10/30/22 1102  BP: 126/78  Pulse: (!) 56  Temp: 98.3 F (36.8 C)  TempSrc: Oral  SpO2: 99%  Weight: 199 lb (90.3 kg)  Height: 5\' 11"  (1.803 m)   Gen: Pleasant, well-nourished, in no distress,  normal affect  ENT: No lesions,  mouth clear,  oropharynx clear, no postnasal drip  Neck: No JVD, inspiratory and expiratory upper airway noise  Lungs: No use of accessory muscles, some referred upper airway noise, bilateral end expiratory wheezing  Cardiovascular: RRR, heart sounds normal, no murmur or gallops, no peripheral edema  Musculoskeletal: No deformities, no cyanosis or clubbing  Neuro: alert, awake, non focal  Skin: Warm, no lesions or rash     Assessment & Plan:  COPD (chronic obstructive pulmonary disease) (HCC) Evaluation and pulmonary function testing consistent with COPD and slow progression which is not surprising given his continued tobacco use.  Will do a trial of BD to see if he gets benefit.  We reviewed your pulmonary function testing today. We will do a trial of a medication called Stiolto.   Take 2 puffs once daily.  Keep track of how the medication helps you.  If you do get benefit then please call our office so we can send a prescription to your pharmacy. Keep your albuterol available use 2 puffs if needed for shortness of breath, chest tightness, wheezing. Follow-up with APP in 6 months Follow Dr. Delton Coombes in 1 year or sooner if you have problems.  Smoker Discussed the importance of cessation with him today.  In particular reviewed relationship of continued tobacco use to his progressive dyspnea and COPD.  We talked about strategies to cut down.  Ultimately we will also discuss strategies to stop altogether.   Levy Pupa, MD, PhD 11/06/2022, 6:05 PM [facility] [phone] or if no answer before 7:00PM call [phone] For any issues after 7:00PM please call eLink [phone]

## 2022-11-03 ENCOUNTER — Ambulatory Visit: Payer: Medicare HMO | Admitting: Medical

## 2022-11-06 ENCOUNTER — Encounter: Payer: Self-pay | Admitting: Cardiology

## 2022-11-06 ENCOUNTER — Ambulatory Visit: Payer: Medicare HMO | Attending: Cardiology | Admitting: Cardiology

## 2022-11-06 VITALS — BP 142/90 | HR 61 | Ht 71.0 in | Wt 199.6 lb

## 2022-11-06 DIAGNOSIS — F17200 Nicotine dependence, unspecified, uncomplicated: Secondary | ICD-10-CM

## 2022-11-06 DIAGNOSIS — E782 Mixed hyperlipidemia: Secondary | ICD-10-CM | POA: Diagnosis not present

## 2022-11-06 DIAGNOSIS — R0789 Other chest pain: Secondary | ICD-10-CM | POA: Diagnosis not present

## 2022-11-06 DIAGNOSIS — J449 Chronic obstructive pulmonary disease, unspecified: Secondary | ICD-10-CM

## 2022-11-06 DIAGNOSIS — R0600 Dyspnea, unspecified: Secondary | ICD-10-CM | POA: Diagnosis not present

## 2022-11-06 DIAGNOSIS — G4733 Obstructive sleep apnea (adult) (pediatric): Secondary | ICD-10-CM | POA: Diagnosis not present

## 2022-11-06 DIAGNOSIS — I10 Essential (primary) hypertension: Secondary | ICD-10-CM

## 2022-11-06 NOTE — Assessment & Plan Note (Signed)
Discussed the importance of cessation with him today.  In particular reviewed relationship of continued tobacco use to his progressive dyspnea and COPD.  We talked about strategies to cut down.  Ultimately we will also discuss strategies to stop altogether.

## 2022-11-06 NOTE — Patient Instructions (Signed)
Medication Instructions:  No changes at this time.   *If you need a refill on your cardiac medications before your next appointment, please call your pharmacy*   Lab Work: None  If you have labs (blood work) drawn today and your tests are completely normal, you will receive your results only by: MyChart Message (if you have MyChart) OR A paper copy in the mail If you have any lab test that is abnormal or we need to change your treatment, we will call you to review the results.   Testing/Procedures: Your physician has requested that you have an echocardiogram. Echocardiography is a painless test that uses sound waves to create images of your heart. It provides your doctor with information about the size and shape of your heart and how well your heart's chambers and valves are working. This procedure takes approximately one hour. There are no restrictions for this procedure. Please do NOT wear cologne, perfume, aftershave, or lotions (deodorant is allowed). Please arrive 15 minutes prior to your appointment time.    Follow-Up: At [facility], you and your health needs are our priority.  As part of our continuing mission to provide you with exceptional heart care, we have created designated Provider Care Teams.  These Care Teams include your primary Cardiologist (physician) and Advanced Practice Providers (APPs -  Physician Assistants and Nurse Practitioners) who all work together to provide you with the care you need, when you need it.   Your next appointment:   Follow up after echo has been done.   Provider:   Debbe Odea, MD or Charlsie Quest, NP

## 2022-11-06 NOTE — Assessment & Plan Note (Signed)
Evaluation and pulmonary function testing consistent with COPD and slow progression which is not surprising given his continued tobacco use.  Will do a trial of BD to see if he gets benefit.  We reviewed your pulmonary function testing today. We will do a trial of a medication called Stiolto.  Take 2 puffs once daily.  Keep track of how the medication helps you.  If you do get benefit then please call our office so we can send a prescription to your pharmacy. Keep your albuterol available use 2 puffs if needed for shortness of breath, chest tightness, wheezing. Follow-up with APP in 6 months Follow Dr. Delton Coombes in 1 year or sooner if you have problems.

## 2022-11-06 NOTE — Progress Notes (Signed)
Cardiology Office Note:  .   Date:  11/06/2022  ID:  Thomas Cross, DOB 03/07/1956, MRN [id number] PCP: Alfredia Ferguson, Cordelia Poche  [facility] Providers Cardiologist:  Debbe Odea, MD    History of Present Illness: Thomas Cross is a 67 y.o. male with a past medical history of gastroesophageal reflux disease, arthritis, current smoker x 40+ years, struct of sleep apnea, COPD, palpitations who presents today for follow-up.  Previously patient been seen in the office due to complaints of shortness of breath and snoring.  He was referred to pulmonary medicine for altered sleep with concerns of OSA and also history of tobacco.  Workup did reveal OSA and COPD.  Management was being planned as per pulmonary medicine.  He was initially compliant with CPAP mask which caused energy levels to go well but started with throat pain and was not compliant with his device.  He did continue to smoke.  Echocardiogram done 09/2019 revealed an LVEF of 55 to 60%, no regional wall motion abnormalities.  He was placed on monitor 06/2021 for complaints of palpitations which revealed several runs of SVT.  PFTs were completed in 01/2020 which revealed the patient has COPD Gold stage II with diffusion defect likely emphysema an emphasis was placed on the importance of smoking cessation.  He was last seen in clinic 01/08/2022 with complaints of increased fatigue.  He recently followed up with pulmonary and had a PET scan completed that revealed several pulmonary nodules in the right lung apex as well as aortic atherosclerosis.  There were no medication changes made and no further testing was ordered at that time.  On 5 09/06/2022 he was evaluated in the [facility] emergency department with complaints of dizziness.  He had two weeks of right eye blurry vision with dizziness.  Not associated with changes in position.  He stated that he was also concerned about losartan and statin daily benefits management shortness of breath  and palpitations predominantly that he can feel a night.  Blood pressure was 179/81, pulse of 63, respirations of 18, temperature 97.6.  Potassium was 3.4, blood glucose 114, BNP of 182.  EKG revealed sinus rhythm without changes.  Chest x-ray was unremarkable.  CT of the chest showed no evidence of PE.  CT angio of the head and neck without any acute abnormality.  He returns to clinic today accompanied by his daughter.  He states that he continues to ongoing shortness of breath.  Pulmonary recently started on a new inhaler that she has yet to start.  He does have some occasional atypical chest discomfort that is like a prickly little sensation that last for a few seconds and then is resolved.  He related to muscle discomfort.  He had also had some issues with dizziness and blurry vision in his right as he already has reduced vision in his left eye.  Large majority of the symptoms have resolved since his emergency department visit.  Started to have questions to management with a left heart catheterization.  ROS: 10 point review of systems has been completed and considered negative with exception of what is in the HPI  Studies Reviewed: Marland Kitchen      EKG completed today revealed normal sinus rhythm with a rate of 61 and LVH with no acute change noted from prior studies  Zio monitor 07/16/21 Normal sinus rhythm Patient had a min HR of 42 bpm, max HR of 169 bpm, and avg HR of 64 bpm.  22 Supraventricular Tachycardia runs occurred, the run with the fastest interval lasting 9 beats with a max rate of 169 bpm, the longest lasting 15 beats with an avg rate of 108 bpm.   Isolated SVEs were rare (<1.0%), SVE Couplets were rare (<1.0%), and SVE Triplets were rare (<1.0%).   Isolated VEs were rare (<1.0%), and no VE Couplets or VE Triplets were present.    1 patient triggered events associated with normal sinus rhythm Other events not triggered    TTE 09/29/19 1. Left ventricular ejection fraction, by  estimation, is 55 to 60%. The  left ventricle has normal function. The left ventricle has no regional  wall motion abnormalities. Left ventricular diastolic parameters were  normal.   2. Right ventricular systolic function is normal. The right ventricular  size is normal.   3. Left atrial size was mildly dilated.   4. The inferior vena cava is dilated in size with >50% respiratory  variability, suggesting right atrial pressure of 8 mmHg.   Risk Assessment/Calculations:     HYPERTENSION CONTROL Vitals:   11/06/22 0804 11/06/22 0825  BP: (!) 148/89 (!) 142/90    The patient's blood pressure is elevated above target today.  In order to address the patient's elevated BP: The blood pressure is usually elevated in clinic.  Blood pressures monitored at home have been optimal.   Patient recorded blood pressures at home and taking his blood pressures around 120-140     Physical Exam:   VS:  BP (!) 142/90 (BP Location: Left Arm, Patient Position: Sitting, Cuff Size: Normal)   Pulse 61   Ht 5\' 11"  (1.803 m)   Wt 199 lb 9.6 oz (90.5 kg)   SpO2 98%   BMI 27.84 kg/m    Wt Readings from Last 3 Encounters:  11/06/22 199 lb 9.6 oz (90.5 kg)  10/30/22 199 lb (90.3 kg)  10/22/22 200 lb (90.7 kg)    GEN: Well nourished, well developed in no acute distress NECK: No JVD; No carotid bruits CARDIAC: RRR, no murmurs, rubs, gallops RESPIRATORY:  Clear to auscultation without rales, wheezing or rhonchi  ABDOMEN: Soft, non-tender, non-distended EXTREMITIES:  No edema; No deformity   ASSESSMENT AND PLAN: .   Continued shortness of breath with dyspnea on exertion that is chronic.  He continues to follow with pulmonary and was recently placed on a new inhaler which she has yet to start.  He also had PFTs that were completed which revealed moderately severe obstruction.  He has been scheduled for an echocardiogram to reevaluate LVEF and wall motion.  Atypical chest discomfort with rest or exertion and  only lasts for few seconds described more likely to be musculoskeletal.  Patient has no record of previous stress testing in the past.  Discussed various modalities and testing prior to catheterization.  Patient is scheduled for echocardiogram if changes in echocardiogram will revisit then further ischemic testing or left heart catheterization.  His family and patient have concerns as he recently lost his brother to an MI.  Mixed hyperlipidemia with LDL of 156.  Continues to decline an any lowering treatments.  This continues to be followed by his PCP.  Current smoker x 40+ years with no desire to quit at this time with total cessation recommended.  Obstructive sleep apnea diagnosed by pulmonary medicine.  CPAP compliance.  Advised he continues to follow with pulmonary for mask fitting and titration as needed.  Essential hypertension with blood pressure today 148/89 recheck of 142/90.  Record  at home reveals blood pressures 120s to 140s are not elevated.  Also chart review of various appointments are not consistently elevated.  Will continue to monitor.       Dispo: Patient return to clinic to see MD/APP after echocardiogram is completed or before if needed.  Signed, Terryn Redner, NP

## 2022-11-10 NOTE — Addendum Note (Signed)
Addended by: Auburn Bilberry D on: 11/10/2022 04:10 PM   Modules accepted: Orders

## 2022-11-11 ENCOUNTER — Ambulatory Visit: Payer: Medicare HMO | Admitting: Cardiology

## 2022-11-17 ENCOUNTER — Ambulatory Visit: Payer: Medicare HMO | Attending: Cardiology

## 2022-11-17 DIAGNOSIS — R0600 Dyspnea, unspecified: Secondary | ICD-10-CM | POA: Diagnosis not present

## 2022-11-17 LAB — ECHOCARDIOGRAM COMPLETE
Area-P 1/2: 4.06 cm2
S' Lateral: 2.9 cm

## 2022-11-24 ENCOUNTER — Ambulatory Visit: Payer: Medicare HMO | Attending: Cardiology | Admitting: Cardiology

## 2022-11-24 ENCOUNTER — Encounter: Payer: Self-pay | Admitting: Cardiology

## 2022-11-24 VITALS — BP 126/82 | HR 68 | Ht 71.0 in | Wt 196.2 lb

## 2022-11-24 DIAGNOSIS — F17200 Nicotine dependence, unspecified, uncomplicated: Secondary | ICD-10-CM | POA: Diagnosis not present

## 2022-11-24 DIAGNOSIS — E782 Mixed hyperlipidemia: Secondary | ICD-10-CM

## 2022-11-24 DIAGNOSIS — R0600 Dyspnea, unspecified: Secondary | ICD-10-CM | POA: Diagnosis not present

## 2022-11-24 DIAGNOSIS — J449 Chronic obstructive pulmonary disease, unspecified: Secondary | ICD-10-CM | POA: Diagnosis not present

## 2022-11-24 DIAGNOSIS — I10 Essential (primary) hypertension: Secondary | ICD-10-CM | POA: Diagnosis not present

## 2022-11-24 DIAGNOSIS — F1010 Alcohol abuse, uncomplicated: Secondary | ICD-10-CM | POA: Diagnosis not present

## 2022-11-24 DIAGNOSIS — G4733 Obstructive sleep apnea (adult) (pediatric): Secondary | ICD-10-CM

## 2022-11-24 NOTE — Patient Instructions (Signed)
Medication Instructions:  Your physician recommends that you continue on your current medications as directed. Please refer to the Current Medication list given to you today.  *If you need a refill on your cardiac medications before your next appointment, please call your pharmacy*   Lab Work: Your provider would like for you to return in 8 weeks to have the following labs drawn: Lipid Panel.   Please go to the [facility] entrance and check in at the front desk.  You do not need an appointment.  They are open from 7am-6 pm.  You need to be fasting.  If you have labs (blood work) drawn today and your tests are completely normal, you will receive your results only by: MyChart Message (if you have MyChart) OR A paper copy in the mail If you have any lab test that is abnormal or we need to change your treatment, we will call you to review the results.   Testing/Procedures: none   Follow-Up: At [facility], you and your health needs are our priority.  As part of our continuing mission to provide you with exceptional heart care, we have created designated Provider Care Teams.  These Care Teams include your primary Cardiologist (physician) and Advanced Practice Providers (APPs -  Physician Assistants and Nurse Practitioners) who all work together to provide you with the care you need, when you need it.  We recommend signing up for the patient portal called "MyChart".  Sign up information is provided on this After Visit Summary.  MyChart is used to connect with patients for Virtual Visits (Telemedicine).  Patients are able to view lab/test results, encounter notes, upcoming appointments, etc.  Non-urgent messages can be sent to your provider as well.   To learn more about what you can do with MyChart, go to [email or web address].    Your next appointment:   3 month(s)  Provider:   You may see Debbe Odea, MD or one of the following Advanced Practice Providers on  your designated Care Team:   Charlsie Quest, NP

## 2022-11-24 NOTE — Progress Notes (Signed)
Discussed results during follow-up.

## 2022-11-24 NOTE — Progress Notes (Signed)
Cardiology Office Note:  .   Date:  11/24/2022  ID:  Thomas Cross, DOB [birth year], MRN [id number] PCP: Alfredia Ferguson, Cordelia Poche  [facility] Providers Cardiologist:  Debbe Odea, MD    History of Present Illness: Thomas Cross is a 67 y.o. male with past medical history gastroesophageal reflux disease, arthritis, current smoker x 20+ years, obstructive sleep apnea, COPD, palpitations who presents today for follow-up.  Echocardiogram done 09/2019 revealed an LVEF of 55 to 60%, no regional wall motion abnormalities.  ZIO monitor 06/2021 for complaints of palpitations revealed several runs of SVT.  PFTs were completed 01/2020 which revealed the patient with COPD Gold stage II with diffusion defect likely emphysema with emphasis placed on smoking cessation.  On 10/06/18/13 emergency department with complaints of dizziness.  He had had 2 weeks of right eye blurry vision with dizziness.  No associated changes with position.  Blood pressure was elevated at 179/81, pulse of 63, respirations 18, temperature 97.6.  Labs revealed potassium 3.4, blood glucose 114, BNP 182.  Chest x-ray was unremarkable and EKG revealed  sinus rhythm without changes.  CT of the chest showed no evidence of PE CT angio of the head and neck without any acute abnormality.  He was last seen in clinic 11/06/2022 accompanied by her daughter we are continuing ongoing shortness of breath.  Pulmonary recently started him on a new inhaler that he had yet to start.  He had some occasional atypical chest discomfort like some prickly sensation that would last for a few seconds and then resolved.  Further medication changes that were made at that time and he was scheduled for follow-up echocardiogram.  He returns to clinic today accompanied by his daughter.  He states overall that he has been doing fairly well.  Denies any chest pain or worsening in shortness of breath.  He did try a new inhaler approximately 1 time.  Daughter  states that in she is starting to call him to remind him to try and use these things and he had been advised that the inhaler would help with some of his shortness of breath early on if he would just agree on taking and remembering the medication.  He also has longstanding history of obstructive sleep apnea and is no longer compliant with CPAP therapy.  Recent echocardiogram was completed without changes which was reassuring.  He denies any recent hospitalizations or visits to the emergency department.  ROS: 10 point review of systems is reviewed and considered negative with exception of what is listed in the HPI  Studies Reviewed: Marland Kitchen       TTE 11/17/22 1. Left ventricular ejection fraction, by estimation, is 55 to 60%. The  left ventricle has normal function. The left ventricle has no regional  wall motion abnormalities. Left ventricular diastolic parameters were  normal.   2. Right ventricular systolic function is normal. The right ventricular  size is mildly enlarged.   3. The mitral valve is normal in structure. No evidence of mitral valve  regurgitation.   4. The aortic valve is tricuspid. Aortic valve regurgitation is not  visualized.   5. The inferior vena cava is normal in size with greater than 50%  respiratory variability, suggesting right atrial pressure of 3 mmHg.   Zio monitor 07/16/21 Normal sinus rhythm Patient had a min HR of 42 bpm, max HR of 169 bpm, and avg HR of 64 bpm.    22 Supraventricular Tachycardia runs occurred, the run  with the fastest interval lasting 9 beats with a max rate of 169 bpm, the longest lasting 15 beats with an avg rate of 108 bpm.   Isolated SVEs were rare (<1.0%), SVE Couplets were rare (<1.0%), and SVE Triplets were rare (<1.0%).   Isolated VEs were rare (<1.0%), and no VE Couplets or VE Triplets were present.    1 patient triggered events associated with normal sinus rhythm Other events not triggered     TTE 09/29/19 1. Left ventricular  ejection fraction, by estimation, is 55 to 60%. The  left ventricle has normal function. The left ventricle has no regional  wall motion abnormalities. Left ventricular diastolic parameters were  normal.   2. Right ventricular systolic function is normal. The right ventricular  size is normal.   3. Left atrial size was mildly dilated.   4. The inferior vena cava is dilated in size with >50% respiratory  variability, suggesting right atrial pressure of 8 mmHg.  Risk Assessment/Calculations:             Physical Exam:   VS:  BP 126/82 (BP Location: Left Arm, Patient Position: Sitting, Cuff Size: Normal)   Pulse 68   Ht 5\' 11"  (1.803 m)   Wt 196 lb 3.2 oz (89 kg)   SpO2 98%   BMI 27.36 kg/m    Wt Readings from Last 3 Encounters:  11/24/22 196 lb 3.2 oz (89 kg)  11/06/22 199 lb 9.6 oz (90.5 kg)  10/30/22 199 lb (90.3 kg)    GEN: Well nourished, well developed in no acute distress NECK: No JVD; No carotid bruits CARDIAC: RRR, no murmurs, rubs, gallops RESPIRATORY:  Clear to auscultation without rales, wheezing or rhonchi  ABDOMEN: Soft, non-tender, non-distended EXTREMITIES:  No edema; No deformity   ASSESSMENT AND PLAN: .   Continue shortness of breath and dyspnea on exertion that is chronic and unchanged.  Patient has tried to new inhaler prescribed by pulmonary wants.  PFTs revealed moderately to severe obstruction with COPD and slow progression, which was not unsurprising given his continued tobacco use.  Echocardiogram was completed which revealed an LVEF of 55 to 60%, no regional wall motion abnormality and no valvular abnormalities were noted.  Mixed hyperlipidemia with LDL of 156.  Continues to decline statin therapy.  Has decided he would take a supplement of red yeast rice and will start that today.  He has been scheduled for lipid panel in 8 weeks.  Current smoker x 40+ years with no desire to quit at this time with total cessation recommended.  Obstructive sleep apnea  diagnosed by pulmonary medicine.  He is noncompliant with CPAP.  Advised to continue to follow with pulmonary and encouraged to continue with therapy.  Essential hypertension with blood pressure today much better controlled at 126/82.  Continued alcohol use with total cessation recommended.        Dispo: Patient return to clinic to see MD/APP in 3 months or sooner if needed.  Signed, Mamta Rimmer, NP

## 2022-12-04 ENCOUNTER — Encounter: Payer: Self-pay | Admitting: Family Medicine

## 2022-12-09 ENCOUNTER — Other Ambulatory Visit: Payer: Self-pay | Admitting: Family Medicine

## 2022-12-09 DIAGNOSIS — R499 Unspecified voice and resonance disorder: Secondary | ICD-10-CM

## 2022-12-25 DIAGNOSIS — Z7982 Long term (current) use of aspirin: Secondary | ICD-10-CM | POA: Diagnosis not present

## 2022-12-25 DIAGNOSIS — Z841 Family history of disorders of kidney and ureter: Secondary | ICD-10-CM | POA: Diagnosis not present

## 2022-12-25 DIAGNOSIS — I499 Cardiac arrhythmia, unspecified: Secondary | ICD-10-CM | POA: Diagnosis not present

## 2022-12-25 DIAGNOSIS — F17210 Nicotine dependence, cigarettes, uncomplicated: Secondary | ICD-10-CM | POA: Diagnosis not present

## 2022-12-25 DIAGNOSIS — Z809 Family history of malignant neoplasm, unspecified: Secondary | ICD-10-CM | POA: Diagnosis not present

## 2022-12-25 DIAGNOSIS — J439 Emphysema, unspecified: Secondary | ICD-10-CM | POA: Diagnosis not present

## 2022-12-25 DIAGNOSIS — E785 Hyperlipidemia, unspecified: Secondary | ICD-10-CM | POA: Diagnosis not present

## 2022-12-25 DIAGNOSIS — K08109 Complete loss of teeth, unspecified cause, unspecified class: Secondary | ICD-10-CM | POA: Diagnosis not present

## 2022-12-25 DIAGNOSIS — I10 Essential (primary) hypertension: Secondary | ICD-10-CM | POA: Diagnosis not present

## 2023-02-09 ENCOUNTER — Encounter: Payer: Self-pay | Admitting: Physician Assistant

## 2023-02-09 ENCOUNTER — Ambulatory Visit: Payer: Medicare HMO | Admitting: Physician Assistant

## 2023-02-09 VITALS — BP 150/75 | HR 57 | Temp 98.4°F | Ht 72.0 in | Wt 197.6 lb

## 2023-02-09 DIAGNOSIS — K219 Gastro-esophageal reflux disease without esophagitis: Secondary | ICD-10-CM | POA: Diagnosis not present

## 2023-02-09 DIAGNOSIS — R490 Dysphonia: Secondary | ICD-10-CM

## 2023-02-09 MED ORDER — OMEPRAZOLE 40 MG PO CPDR
40.0000 mg | DELAYED_RELEASE_CAPSULE | Freq: Every day | ORAL | 2 refills | Status: DC
Start: 2023-02-09 — End: 2023-04-07

## 2023-02-09 NOTE — Progress Notes (Signed)
Celso Amy, PA-C [address]  [address]  [place], [place] [address]  Main: [phone]  Fax: [phone]   Gastroenterology Consultation  Referring Provider:     Jacky Kindle, FNP Primary Care Physician:  Jacky Kindle, FNP Primary Gastroenterologist:  Celso Amy, PA-C / Dr. Wyline Mood   Reason for Consultation:     Voice hoarseness        HPI:   Thomas Cross is a 67 y.o. y/o male referred for consultation & management  by Jacky Kindle, FNP.    Medical history significant for GERD, arthritis, current smoker 40+ years, moderate to severe COPD, hypertension, sleep apnea, continued alcohol use.  Echo 09/2019 showed LVEF 55 to 60%.  Patient is followed by cardiology and pulmonology.  Current symptoms: Patient states he has had voice hoarseness and mild sore throat for 1 or 2 months.  He has not yet seen an ENT specialist.  No prior ENT evaluation.  No previous EGD or barium swallow.  He has history of acid reflux intermittently for many years.  He occasionally takes OTC Prilosec once per month sporadically as needed.  Rarely takes Tums as needed.  He has not had any Prilosec in a long time.  He denies heartburn, chest pain, dysphagia, abdominal pain, or unintentional weight loss.  Last colonoscopy 07/2020 by Dr. Tobi Bastos showed 2 small sessile serrated polyps removed from sigmoid and descending colon.  Excellent prep.  7-year repeat (due 07/2027).   Past Medical History:  Diagnosis Date   Arthritis    GERD (gastroesophageal reflux disease)    RARE-TUMS   Sleep apnea     Past Surgical History:  Procedure Laterality Date   COLONOSCOPY  2009   COLONOSCOPY WITH PROPOFOL N/A 07/27/2020   Procedure: COLONOSCOPY WITH PROPOFOL;  Surgeon: Wyline Mood, MD;  Location: [facility] ENDOSCOPY;  Service: Gastroenterology;  Laterality: N/A;   HERNIA REPAIR  30 years ago   right inguinal   INGUINAL HERNIA REPAIR Right 05/20/2016   Procedure: LAPAROSCOPIC INGUINAL HERNIA;  Surgeon:  Kieth Brightly, MD;  Location: [facility] ORS;  Service: General;  Laterality: Right;   TONSILLECTOMY AND ADENOIDECTOMY      Prior to Admission medications   Medication Sig Start Date End Date Taking? Authorizing Provider  aspirin EC 81 MG tablet Take 81 mg by mouth every evening.    [provider]    Family History  Problem Relation Age of Onset   Heart disease Mother    Kidney cancer Mother    Bladder Cancer Mother    Arthritis Mother    CVA Father    Prostate cancer Father    Dementia Father    Hypertension Father    Heart attack Father    Heart attack Brother    Hyperlipidemia Brother    Heart disease Maternal Grandfather    Stroke Paternal Grandmother    Colon cancer Paternal Grandfather    Hyperlipidemia Sister    Hyperlipidemia Brother    Hyperlipidemia Brother    Hyperlipidemia Brother      Social History   Tobacco Use   Smoking status: Every Day    Current packs/day: 1.50    Average packs/day: 1.5 packs/day for 45.0 years (67.5 ttl pk-yrs)    Types: Cigarettes   Smokeless tobacco: Never   Tobacco comments:    30 - 40   cigarettes smoked daily [facility] 10/30/22  Vaping Use   Vaping status: Never Used  Substance Use Topics  Alcohol use: Yes    Alcohol/week: 14.0 standard drinks of alcohol    Types: 14 Shots of liquor per week    Comment: 2 MIXED DRINKS EVERY DAY   Drug use: No    Allergies as of 02/09/2023   (No Known Allergies)    Review of Systems:    All systems reviewed and negative except where noted in HPI.   Physical Exam:  BP (!) 150/75   Pulse (!) 57   Temp 98.4 F (36.9 C)   Ht 6' (1.829 m)   Wt 197 lb 9.6 oz (89.6 kg)   BMI 26.80 kg/m  No LMP for male patient.  General:   Alert,  Well-developed, well-nourished, pleasant and cooperative in NAD Mouth: Edentulous.  No dentures. Tonsils are surgically absent.  No rashes or lesions in the mouth or oropharynx.   Neck: No lymphadenopathy, masses, or thyromegaly. Lungs:   Respirations even and unlabored.  Clear throughout to auscultation.   No wheezes, crackles, or rhonchi. No acute distress. Heart:  Regular rate and rhythm; no murmurs, clicks, rubs, or gallops. Abdomen:  Normal bowel sounds.  No bruits.  Soft, and non-distended without masses, hepatosplenomegaly or hernias noted.  No Tenderness.  No guarding or rebound tenderness.    Neurologic:  Alert and oriented x3;  grossly normal neurologically.  Imaging Studies: No results found.  Assessment and Plan:   VERNE CHAUSSEE is a 67 y.o. y/o male has been referred for:  1.  Voice hoarseness -possibly due to to laryngeal pharyngeal reflux disease  Trial of PPI -omeprazole 40 Mg 1 capsule once daily for 1 month.  If symptoms persist in spite of PPI therapy, then we will schedule EGD and also recommend ENT evaluation.  2.  GERD  Start omeprazole 40 Mg 1 capsule once daily, take 30 minutes before meal, #30, 2 refills. Recommend Lifestyle Modifications to prevent Acid Reflux.  Rec. Avoid coffee, sodas, peppermint, citrus fruits, and spicey foods.  Avoid eating 2-3 hours before bedtime.   If symptoms persist on PPI, then we will schedule EGD.  He has no alarm symptoms such as dysphagia or weight loss.  3.  Tobacco dependence  Encouraged smoking cessation   Follow up in 4 weeks with Inetta Fermo.  Celso Amy, PA-C

## 2023-02-24 ENCOUNTER — Encounter: Payer: Self-pay | Admitting: Cardiology

## 2023-02-24 ENCOUNTER — Ambulatory Visit: Payer: Medicare HMO | Attending: Cardiology | Admitting: Cardiology

## 2023-02-24 VITALS — BP 150/84 | HR 57 | Ht 72.0 in | Wt 196.8 lb

## 2023-02-24 DIAGNOSIS — F1010 Alcohol abuse, uncomplicated: Secondary | ICD-10-CM

## 2023-02-24 DIAGNOSIS — G4733 Obstructive sleep apnea (adult) (pediatric): Secondary | ICD-10-CM | POA: Diagnosis not present

## 2023-02-24 DIAGNOSIS — R0602 Shortness of breath: Secondary | ICD-10-CM | POA: Diagnosis not present

## 2023-02-24 DIAGNOSIS — F17200 Nicotine dependence, unspecified, uncomplicated: Secondary | ICD-10-CM

## 2023-02-24 DIAGNOSIS — I10 Essential (primary) hypertension: Secondary | ICD-10-CM

## 2023-02-24 DIAGNOSIS — R079 Chest pain, unspecified: Secondary | ICD-10-CM | POA: Diagnosis not present

## 2023-02-24 DIAGNOSIS — J449 Chronic obstructive pulmonary disease, unspecified: Secondary | ICD-10-CM | POA: Diagnosis not present

## 2023-02-24 DIAGNOSIS — E782 Mixed hyperlipidemia: Secondary | ICD-10-CM | POA: Diagnosis not present

## 2023-02-24 NOTE — Patient Instructions (Signed)
Medication Instructions:  No changes at this time.   *If you need a refill on your cardiac medications before your next appointment, please call your pharmacy*   Lab Work: Lipid panel today  If you have labs (blood work) drawn today and your tests are completely normal, you will receive your results only by: MyChart Message (if you have MyChart) OR A paper copy in the mail If you have any lab test that is abnormal or we need to change your treatment, we will call you to review the results.   Testing/Procedures: How to Prepare for Your Cardiac PET/CT Stress Test:  1. Please do not take these medications before your test:   Medications that may interfere with the cardiac pharmacological stress agent (ex. nitrates - including erectile dysfunction medications, isosorbide mononitrate, tamulosin or beta-blockers) the day of the exam. (Erectile dysfunction medication should be held for at least 72 hrs prior to test) Theophylline containing medications for 12 hours. Dipyridamole 48 hours prior to the test. Your remaining medications may be taken with water.  2. Nothing to eat or drink, except water, 3 hours prior to arrival time.   NO caffeine/decaffeinated products, or chocolate 12 hours prior to arrival.  3. NO perfume, cologne or lotion on chest or abdomen area.          - FEMALES - Please avoid wearing dresses to this appointment.  4. Total time is 1 to 2 hours; you may want to bring reading material for the waiting time.  5. Please report to Radiology at the [facility] Main Entrance 30 minutes early for your test.  [address] [place], [place] [address]  6. Please report to Radiology at [facility] Main Entrance, medical mall, 30 mins prior to your test.  [address]  [place], [place]  [phone]  Diabetic Preparation:  Hold oral medications. You may take NPH and Lantus insulin. Do not take Humalog or Humulin R (Regular  Insulin) the day of your test. Check blood sugars prior to leaving the house. If able to eat breakfast prior to 3 hour fasting, you may take all medications, including your insulin, Do not worry if you miss your breakfast dose of insulin - start at your next meal. Patients who wear a continuous glucose monitor MUST remove the device prior to scanning.  IF YOU THINK YOU MAY BE PREGNANT, OR ARE NURSING PLEASE INFORM THE TECHNOLOGIST.  In preparation for your appointment, medication and supplies will be purchased.  Appointment availability is limited, so if you need to cancel or reschedule, please call the Radiology Department at [phone] [facility]) OR [phone] [facility])  24 hours in advance to avoid a cancellation fee of $100.00  What to Expect After you Arrive:  Once you arrive and check in for your appointment, you will be taken to a preparation room within the Radiology Department.  A technologist or Nurse will obtain your medical history, verify that you are correctly prepped for the exam, and explain the procedure.  Afterwards,  an IV will be started in your arm and electrodes will be placed on your skin for EKG monitoring during the stress portion of the exam. Then you will be escorted to the PET/CT scanner.  There, staff will get you positioned on the scanner and obtain a blood pressure and EKG.  During the exam, you will continue to be connected to the EKG and blood pressure machines.  A small, safe amount of a radioactive tracer  will be injected in your IV to obtain a series of pictures of your heart along with an injection of a stress agent.    After your Exam:  It is recommended that you eat a meal and drink a caffeinated beverage to counter act any effects of the stress agent.  Drink plenty of fluids for the remainder of the day and urinate frequently for the first couple of hours after the exam.  Your doctor will inform you of your test results within 7-10 business days.  For  more information and frequently asked questions, please visit our website : [email or web address]  For questions about your test or how to prepare for your test, please call: Cardiac Imaging Nurse Navigators Office: [phone]    Follow-Up: At [facility], you and your health needs are our priority.  As part of our continuing mission to provide you with exceptional heart care, we have created designated Provider Care Teams.  These Care Teams include your primary Cardiologist (physician) and Advanced Practice Providers (APPs -  Physician Assistants and Nurse Practitioners) who all work together to provide you with the care you need, when you need it.   Your next appointment:   Follow up after test.   Provider:   Debbe Odea, MD or Charlsie Quest, NP

## 2023-02-24 NOTE — Progress Notes (Signed)
Cardiology Office Note:  .   Date:  02/24/2023  ID:  Thomas Cross, DOB 1955-08-02, MRN [id number] PCP: Jacky Kindle, FNP  [facility] Providers Cardiologist:  Debbe Odea, MD    History of Present Illness: Thomas Cross is a 67 y.o. male with a past medical history of gastroesophageal reflux disease, arthritis, current smoker x 20+ years, obstructive sleep apnea, COPD, palpitations, who presents today for follow-up.  Echocardiogram 09/2019 revealed LVEF 55 to 60%, no RWMA.  ZIO monitor 06/2021 for complaint of palpitations revealed several runs of SVT.  PFTs were completed 01/2020 which revealed the patient had COPD Gold stage II with diffusion defect likely emphysema with emphasis placed on smoking cessation.  Echocardiogram completed 11/17/2022 revealed LVEF 55 to 60%, no RWMA, and no valvular abnormalities.  He was last seen in clinic 11/24/2022.  The cardiac perspective he been doing fairly well.  Unfortunately at that time with his longstanding history of obstructive sleep apnea is no longer compliant with his CPAP therapy.  No medication changes were made and no further testing that was ordered.  He returns to clinic today accompanied by his daughter.  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  He states that he has followed up with GI who has placed him on PPI therapy for silent acid reflux.  He is having complaints of chest discomfort that he states the chest pressure that happens with rest or exertion that is midsternal and to the left side of his chest.  He has chronic shortness of breath that is unchanged from his COPD.  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  Denies any hospitalizations or visits to the emergency  department.  ROS: 10 point review of systems has been reviewed and considered negative with exception what is been listed in the HPI  Studies Reviewed: Marland Kitchen        TTE 11/17/22 1. Left ventricular ejection fraction, by estimation, is 55 to 60%. The  left ventricle has normal function. The left ventricle has no regional  wall motion abnormalities. Left ventricular diastolic parameters were  normal.   2. Right ventricular systolic function is normal. The right ventricular  size is mildly enlarged.   3. The mitral valve is normal in structure. No evidence of mitral valve  regurgitation.   4. The aortic valve is tricuspid. Aortic valve regurgitation is not  visualized.   5. The inferior vena cava is normal in size with greater than 50%  respiratory variability, suggesting right atrial pressure of 3 mmHg.    Zio monitor 07/16/21 Normal sinus rhythm Patient had a min HR of 42 bpm, max HR of 169 bpm, and avg HR of 64 bpm.    22 Supraventricular Tachycardia runs occurred, the run with the fastest interval lasting 9 beats with a max rate of 169 bpm, the longest lasting 15 beats with an avg rate of 108 bpm.   Isolated SVEs were rare (<1.0%), SVE Couplets were rare (<1.0%), and SVE Triplets were rare (<1.0%).   Isolated VEs were rare (<1.0%), and no VE Couplets or VE Triplets were present.    1 patient triggered events associated with normal sinus rhythm Other  events not triggered     TTE 09/29/19 1. Left ventricular ejection fraction, by estimation, is 55 to 60%. The  left ventricle has normal function. The left ventricle has no regional  wall motion abnormalities. Left ventricular diastolic parameters were  normal.   2. Right ventricular systolic function is normal. The right ventricular  size is normal.   3. Left atrial size was mildly dilated.   4. The inferior vena cava is dilated in size with >50% respiratory  variability, suggesting right atrial pressure of 8 mmHg.  Risk  Assessment/Calculations:     HYPERTENSION CONTROL Vitals:   02/24/23 0912 02/24/23 0930  BP: (!) 154/86 (!) 150/84    The patient's blood pressure is elevated above target today.  In order to address the patient's elevated BP: Blood pressure will be monitored at home to determine if medication changes need to be made. (currently patient is not amendable to taking antihypertensives)          Physical Exam:   VS:  BP (!) 150/84 (BP Location: Right Arm, Patient Position: Sitting, Cuff Size: Normal)   Pulse (!) 57   Ht 6' (1.829 m)   Wt 196 lb 12.8 oz (89.3 kg)   SpO2 99%   BMI 26.69 kg/m    Wt Readings from Last 3 Encounters:  02/24/23 196 lb 12.8 oz (89.3 kg)  02/09/23 197 lb 9.6 oz (89.6 kg)  11/24/22 196 lb 3.2 oz (89 kg)    GEN: Well nourished, well developed in no acute distress NECK: No JVD; No carotid bruits CARDIAC: RRR, no murmurs, rubs, gallops RESPIRATORY:  Clear to auscultation without rales, wheezing or rhonchi  ABDOMEN: Soft, non-tender, non-distended EXTREMITIES:  No edema; No deformity   ASSESSMENT AND PLAN: .   Chest pain that is described as a tightness or pressure substernal and occasionally to the left side of his chest.  He is not sure whether it is related to COPD or acid reflux.  He states it happens several times a week.  Discussed several options testing and with his extensive history of COPD with Adacel Lexiscan would not be appropriate, he is unable to walk on a treadmill, he has been scheduled for cardiac PET stress. He is continued on ASA 81 mg daily.  Chronic shortness of breath and dyspnea on exertion with COPD.  Previous PFTs revealed moderate to severe obstruction with COPD and slow progression which is not surprising with his continued tobacco use.  He had tried a new inhaler previously prescribed by pulmonary but he stopped taking the medication.  Hypertension with a blood pressure today 154/86 and recheck of 150/84.  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  Currently has not been interested in starting antihypertensive medications.  Will continue to revisit at return visit previous blood pressures were 120 systolic.  Mixed hyperlipidemia with an LDL of 156.  Being sent for repeat lipid panel today.  Continues to decline statin therapy.  Current smoker x 40+ years with some onset of cessation recommended.  Obstructive sleep apnea diagnosed by pulmonary medicine.  He states he is no longer using CPAP as it was a recommendation not a necessity.  Advised him to follow back up with pulmonary.  Continued alcohol use with total cessation recommended    Informed Consent   Shared Decision Making/Informed Consent The risks [chest pain, shortness of breath, cardiac arrhythmias, dizziness, blood pressure fluctuations, myocardial infarction, stroke/transient ischemic  attack, nausea, vomiting, allergic reaction, radiation exposure, metallic taste sensation and life-threatening complications (estimated to be 1 in 10,000)], benefits (risk stratification, diagnosing coronary artery disease, treatment guidance) and alternatives of a cardiac PET stress test were discussed in detail with Mr. Deman and he agrees to proceed.     Dispo: Patient to return to clinic to see MD/APP once testing is completed  Signed, Malone Vanblarcom, NP

## 2023-02-25 LAB — LIPID PANEL
Chol/HDL Ratio: 5.5 {ratio} — ABNORMAL HIGH (ref 0.0–5.0)
Cholesterol, Total: 215 mg/dL — ABNORMAL HIGH (ref 100–199)
HDL: 39 mg/dL — ABNORMAL LOW (ref >39)
LDL Chol Calc (NIH): 149 mg/dL — ABNORMAL HIGH (ref 0–99)
Triglycerides: 147 mg/dL (ref 0–149)
VLDL Cholesterol Cal: 27 mg/dL (ref 5–40)

## 2023-02-25 NOTE — Progress Notes (Signed)
LDL or bad cholesterol remains elevated at 149. It would be beneficial to take a cholesterol medication. Recommend Crestor 2.5 mg nightly to see if he tolerates. If he is agreeable to start the medication will need a repeat lipid panel and LFT in 3 months.

## 2023-03-10 ENCOUNTER — Encounter (HOSPITAL_COMMUNITY): Payer: Self-pay

## 2023-03-12 ENCOUNTER — Ambulatory Visit
Admission: RE | Admit: 2023-03-12 | Discharge: 2023-03-12 | Disposition: A | Discharge status: Home / Self Care | Payer: Medicare HMO | Source: Ambulatory Visit | Attending: Cardiology | Admitting: Cardiology

## 2023-03-12 ENCOUNTER — Ambulatory Visit: Payer: Medicare HMO | Admitting: Physician Assistant

## 2023-03-12 DIAGNOSIS — R079 Chest pain, unspecified: Secondary | ICD-10-CM | POA: Diagnosis not present

## 2023-03-12 DIAGNOSIS — M4854XA Collapsed vertebra, not elsewhere classified, thoracic region, initial encounter for fracture: Secondary | ICD-10-CM | POA: Diagnosis not present

## 2023-03-12 DIAGNOSIS — I700 Atherosclerosis of aorta: Secondary | ICD-10-CM | POA: Insufficient documentation

## 2023-03-12 DIAGNOSIS — I2089 Other forms of angina pectoris: Secondary | ICD-10-CM | POA: Diagnosis not present

## 2023-03-12 LAB — NM PET CT CARDIAC PERFUSION MULTI W/ABSOLUTE BLOODFLOW
MBFR: 2.58
Nuc Rest EF: 59 %
Nuc Stress EF: 57 %
Peak HR: 78 {beats}/min
Rest HR: 65 {beats}/min
Rest MBF: 0.84 ml/g/min
Rest Nuclear Isotope Dose: 23.1 mCi
SRS: 1
SSS: 0
ST Depression (mm): 0 mm
Stress MBF: 2.17 ml/g/min
Stress Nuclear Isotope Dose: 23.1 mCi
TID: 1

## 2023-03-12 MED ORDER — RUBIDIUM RB82 GENERATOR (RUBYFILL)
25.0000 | PACK | Freq: Once | INTRAVENOUS | Status: AC
  Administered 2023-03-12: 23.05 via INTRAVENOUS

## 2023-03-12 MED ORDER — REGADENOSON 0.4 MG/5ML IV SOLN
0.4000 mg | Freq: Once | INTRAVENOUS | Status: AC
  Administered 2023-03-12: 0.4 mg via INTRAVENOUS
  Filled 2023-03-12: qty 5

## 2023-03-12 MED ORDER — REGADENOSON 0.4 MG/5ML IV SOLN
INTRAVENOUS | Status: AC
  Filled 2023-03-12: qty 5

## 2023-03-12 NOTE — Progress Notes (Signed)
Patient presents for a cardiac PET stress test and tolerated procedure without incident. Patient maintained acceptable vital signs throughout the test and was offered caffeine after test.  Patient ambulated out of department with a steady gait.  

## 2023-03-13 NOTE — Progress Notes (Signed)
No evidence of ischemia or infarction noted.  Global blood flow reserve was normal.  This study is normal and considered low risk overall.  Very reassuring study.

## 2023-04-06 NOTE — Progress Notes (Unsigned)
Celso Amy, PA-C [address]  [address]  [place], [place] [address]  Main: [phone]  Fax: [phone]   Primary Care Physician: Jacky Kindle, FNP  Primary Gastroenterologist:  Celso Amy, PA-C / Dr. Wyline Mood    CC: Follow-up GERD and voice hoarseness  HPI: Thomas Cross is a 67 y.o. male returns for 6-week follow-up of GERD and voice hoarseness.  6 weeks ago he was started on omeprazole 40 Mg once daily.  Avoiding GERD trigger foods.  Symptoms have greatly improved.  He has no more voice hoarseness.  Occasionally feels like food gets stuck in his throat.  No chest pain, heartburn, abdominal pain, weight loss, or other GI symptoms.  History of intermittent acid reflux for many years.  OTC Prilosec stopped working.  No previous EGD, barium swallow test, or ENT evaluation.  Last colonoscopy 07/2020 by Dr. Tobi Bastos showed 2 small sessile serrated polyps removed from sigmoid and descending colon. Excellent prep. 7-year repeat (due 07/2027).   Medical history significant for GERD, arthritis, current smoker 40+ years, moderate to severe COPD, hypertension, sleep apnea, continued alcohol use.  Echo 09/2019 showed LVEF 55 to 60%.  Patient is followed by cardiology and pulmonology.     Prior to Admission medications   Medication Sig Start Date End Date Taking? Authorizing Provider  omeprazole (PRILOSEC) 40 MG capsule Take 1 capsule (40 mg total) by mouth daily. 04/07/23 07/06/23 Yes Celso Amy, PA-C     Allergies as of 04/07/2023   (No Known Allergies)    Past Medical History:  Diagnosis Date   Arthritis    GERD (gastroesophageal reflux disease)    RARE-TUMS   Sleep apnea     Past Surgical History:  Procedure Laterality Date   COLONOSCOPY  2009   COLONOSCOPY WITH PROPOFOL N/A 07/27/2020   Procedure: COLONOSCOPY WITH PROPOFOL;  Surgeon: Wyline Mood, MD;  Location[facility] ENDOSCOPY;  Service: Gastroenterology;  Laterality: N/A;   HERNIA REPAIR  30 years ago   right  inguinal   INGUINAL HERNIA REPAIR Right 05/20/2016   Procedure: LAPAROSCOPIC INGUINAL HERNIA;  Surgeon: Kieth Brightly, MD;  Location: [facility] ORS;  Service: General;  Laterality: Right;   TONSILLECTOMY AND ADENOIDECTOMY      Review of Systems:    All systems reviewed and negative except where noted in HPI.   Physical Examination:   BP (!) 173/71   Pulse 62   Temp 98.2 F (36.8 C)   Ht 6' (1.829 m)   Wt 193 lb (87.5 kg)   BMI 26.18 kg/m   General: Well-nourished, well-developed in no acute distress.  Lungs: Clear to auscultation bilaterally. Non-labored. Heart: Regular rate and rhythm, no murmurs rubs or gallops.  Abdomen: Bowel sounds are normal; Abdomen is Soft; No hepatosplenomegaly, masses or hernias;  No Abdominal Tenderness; No guarding or rebound tenderness. Neuro: Alert and oriented x 3.  Grossly intact.  Psych: Alert and cooperative, normal mood and affect.   Imaging Studies: NM PET CT CARDIAC PERFUSION MULTI W/ABSOLUTE BLOODFLOW  Result Date: 03/12/2023   LV perfusion is normal. There is no evidence of ischemia. There is no evidence of infarction.   Rest left ventricular function is normal. Rest EF: 59%. Stress left ventricular function is normal. Stress EF: 57%. End diastolic cavity size is normal. End systolic cavity size is normal.   Myocardial blood flow was computed to be 0.14ml/g/min at rest and 2.57ml/g/min at stress. Global myocardial blood flow reserve was 2.58 and was normal.  Coronary calcium was absent on the attenuation correction CT images.   The study is normal. The study is low risk.   Electronically signed by Epifanio Lesches, MD EXAM: OVER-READ INTERPRETATION CARDIAC CT CHEST The following report is an over-read performed by radiologist Dr. Gaylyn Rong of [facility] on 03/12/2023. This over-read does not include interpretation of cardiac or coronary anatomy or pathology. The cardiac and PET interpretation by the cardiologist is  attached or will be attached. COMPARISON:  10/06/2022 FINDINGS: Extracardiac Vascular: Descending thoracic aorta atheromatous vascular calcification. Mediastinum: Unremarkable Lung: Unremarkable Included Upper Abdomen: Unremarkable Musculoskeletal: Stable mild anterior T7 wedging. IMPRESSION: 1. No acute findings. 2. Stable mild anterior T7 wedging. 3. Descending thoracic aorta atheromatous vascular calcification. 4. Aortic atherosclerosis. Aortic Atherosclerosis (ICD10-I70.0). Electronically Signed   By: Gaylyn Rong M.D.   On: 03/12/2023 15:09   Assessment and Plan:   Thomas Cross is a 67 y.o. y/o male returns for 6-week follow-up of:  1.  GERD x many years not controlled on low dose PPI Omeprazole 20mg  daily; Improved on Omeprazole 40mg  daily.  **Screen for Barrett's.  Scheduling EGD I discussed risks of EGD with patient to include risk of bleeding, perforation, and risk of sedation.  Patient expressed understanding and agrees to proceed with EGD.   Continue omeprazole 40 Mg daily and GERD diet.  2.  Dysphagia: Mild pharyngeal dysphagia  Scheduling EGD I discussed risks of EGD with patient to include risk of bleeding, perforation, and risk of sedation.  Patient expressed understanding and agrees to proceed with EGD.   Celso Amy, PA-C  Follow up As needed based on EGD results and GI symptoms.

## 2023-04-07 ENCOUNTER — Ambulatory Visit: Payer: Medicare HMO | Admitting: Physician Assistant

## 2023-04-07 ENCOUNTER — Encounter: Payer: Self-pay | Admitting: Physician Assistant

## 2023-04-07 VITALS — BP 173/71 | HR 62 | Temp 98.2°F | Ht 72.0 in | Wt 193.0 lb

## 2023-04-07 DIAGNOSIS — R1310 Dysphagia, unspecified: Secondary | ICD-10-CM

## 2023-04-07 DIAGNOSIS — K219 Gastro-esophageal reflux disease without esophagitis: Secondary | ICD-10-CM

## 2023-04-07 DIAGNOSIS — R490 Dysphonia: Secondary | ICD-10-CM

## 2023-04-07 DIAGNOSIS — R1313 Dysphagia, pharyngeal phase: Secondary | ICD-10-CM | POA: Diagnosis not present

## 2023-04-07 MED ORDER — OMEPRAZOLE 40 MG PO CPDR
40.0000 mg | DELAYED_RELEASE_CAPSULE | Freq: Every day | ORAL | 2 refills | Status: DC
Start: 2023-04-07 — End: 2023-05-07

## 2023-04-14 ENCOUNTER — Ambulatory Visit: Payer: Medicare HMO | Attending: Cardiology | Admitting: Cardiology

## 2023-04-14 ENCOUNTER — Encounter: Payer: Self-pay | Admitting: Cardiology

## 2023-04-14 VITALS — BP 142/82 | HR 60 | Ht 72.0 in | Wt 193.6 lb

## 2023-04-14 DIAGNOSIS — F17200 Nicotine dependence, unspecified, uncomplicated: Secondary | ICD-10-CM

## 2023-04-14 DIAGNOSIS — I10 Essential (primary) hypertension: Secondary | ICD-10-CM | POA: Diagnosis not present

## 2023-04-14 DIAGNOSIS — R079 Chest pain, unspecified: Secondary | ICD-10-CM | POA: Diagnosis not present

## 2023-04-14 DIAGNOSIS — J449 Chronic obstructive pulmonary disease, unspecified: Secondary | ICD-10-CM

## 2023-04-14 DIAGNOSIS — G4733 Obstructive sleep apnea (adult) (pediatric): Secondary | ICD-10-CM | POA: Diagnosis not present

## 2023-04-14 DIAGNOSIS — IMO0001 Reserved for inherently not codable concepts without codable children: Secondary | ICD-10-CM

## 2023-04-14 DIAGNOSIS — N529 Male erectile dysfunction, unspecified: Secondary | ICD-10-CM | POA: Diagnosis not present

## 2023-04-14 DIAGNOSIS — E782 Mixed hyperlipidemia: Secondary | ICD-10-CM | POA: Diagnosis not present

## 2023-04-14 MED ORDER — SILDENAFIL CITRATE 25 MG PO TABS: 25.0000 mg | ORAL_TABLET | Freq: Every day | ORAL | 0 refills | Status: AC | PRN

## 2023-04-14 NOTE — Progress Notes (Unsigned)
Cardiology Office Note:  .   Date:  04/14/2023  ID:  Thomas Cross, DOB June 06, 1955, MRN [id number] PCP: Jacky Kindle, FNP  [facility] Providers Cardiologist:  Debbe Odea, MD { Click to update primary MD,subspecialty MD or APP then REFRESH:1}   History of Present Illness: Thomas Cross is a 67 y.o. male with past medical history gastroesophageal reflux disease, arthritis, current smoker x 20+ years, obstructive sleep apnea, COPD, palpitations, who presents today for follow-up.   Prior echocardiogram completed 09/2019 revealed an LVEF of 55 to 60%, no RWMA.  ZIO monitor 06/2021 for complaint of palpitations revealed some runs of SVT.  PFTs were completed 01/2020 which revealed the patient had COPD Gold stage II with a diffusion defect likely emphysema with emphasis placed on smoking cessation.  Repeat echocardiogram completed 11/17/2022 revealed LVEF 55 to 60%, no RWMA, and no valvular abnormalities.  Cardiac PET stress test completed 03/08/2023 with LV perfusion that was normal with no evidence of ischemia or infarct.  Myocardial blood flow was computed and considered normal.  Coronary calcium was absent on the attenuation correction CT images.  The study was normal and considered low risk.   He was last seen in clinic 02/24/2023 accompanied by his daughter.  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  He also continue to follow-up with GI who had recently placed him on PPI therapy for silent acid reflux.  For his continued chest pain that he described as a tightness or substernal pressure he was scheduled for cardiac PET stress which revealed low risk study with no infarct or effusion.  He returns to clinic today stating that overall he has been doing well from a cardiac  perspective.  Continues to have chronic shortness of breath and dyspnea on exertion.  Occasionally has chest discomfort that is more muscular.  States he continues to be followed by GI who is continued him on PPI therapy for silent acid reflux.  He is scheduled to have an upcoming EGD to determine where his symptoms are coming from.  Denies any hospitalization or visits to the emergency department.  ROS: 10 point review of systems has been reviewed and considered negative with exception was been listed in the HPI  Studies Reviewed: Marland Kitchen   EKG Interpretation Date/Time:  Tuesday April 14 2023 11:16:30 EST Ventricular Rate:  60 PR Interval:  150 QRS Duration:  90 QT Interval:  424 QTC Calculation: 424 R Axis:   67  Text Interpretation: Sinus rhythm with marked sinus arrhythmia When compared with ECG of 06-Oct-2022 15:53, No significant change was found Confirmed by Charlsie Quest ([id number]) on 04/14/2023 11:24:05 AM    Cardiac PET 03/12/23   LV perfusion is normal. There is no evidence of ischemia. There is no evidence of infarction.   Rest left ventricular function is normal. Rest EF: 59%. Stress left ventricular function is normal. Stress EF: 57%. End diastolic cavity size is normal. End systolic cavity size is normal.   Myocardial blood flow was computed to be 0.42ml/g/min at rest and 2.75ml/g/min at stress. Global myocardial blood flow reserve was 2.58 and was normal.   Coronary calcium was absent on the attenuation correction CT images.   The study is normal. The study is  low risk.  TTE 11/17/22 1. Left ventricular ejection fraction, by estimation, is 55 to 60%. The  left ventricle has normal function. The left ventricle has no regional  wall motion abnormalities. Left ventricular diastolic parameters were  normal.   2. Right ventricular systolic function is normal. The right ventricular  size is mildly enlarged.   3. The mitral valve is normal in structure. No evidence of mitral valve   regurgitation.   4. The aortic valve is tricuspid. Aortic valve regurgitation is not  visualized.   5. The inferior vena cava is normal in size with greater than 50%  respiratory variability, suggesting right atrial pressure of 3 mmHg.    Zio monitor 07/16/21 Normal sinus rhythm Patient had a min HR of 42 bpm, max HR of 169 bpm, and avg HR of 64 bpm.    22 Supraventricular Tachycardia runs occurred, the run with the fastest interval lasting 9 beats with a max rate of 169 bpm, the longest lasting 15 beats with an avg rate of 108 bpm.   Isolated SVEs were rare (<1.0%), SVE Couplets were rare (<1.0%), and SVE Triplets were rare (<1.0%).   Isolated VEs were rare (<1.0%), and no VE Couplets or VE Triplets were present.    1 patient triggered events associated with normal sinus rhythm Other events not triggered   TTE 09/29/19 1. Left ventricular ejection fraction, by estimation, is 55 to 60%. The  left ventricle has normal function. The left ventricle has no regional  wall motion abnormalities. Left ventricular diastolic parameters were  normal.   2. Right ventricular systolic function is normal. The right ventricular  size is normal.   3. Left atrial size was mildly dilated.   4. The inferior vena cava is dilated in size with >50% respiratory  variability, suggesting right atrial pressure of 8 mmHg.  Risk Assessment/Calculations:        Physical Exam:   VS:  BP (!) 142/82 (BP Location: Left Arm, Patient Position: Sitting, Cuff Size: Normal)   Pulse 60   Ht 6' (1.829 m)   Wt 193 lb 9.6 oz (87.8 kg)   SpO2 98%   BMI 26.26 kg/m    Wt Readings from Last 3 Encounters:  04/14/23 193 lb 9.6 oz (87.8 kg)  04/07/23 193 lb (87.5 kg)  02/24/23 196 lb 12.8 oz (89.3 kg)    GEN: Well nourished, well developed in no acute distress NECK: No JVD; No carotid bruits CARDIAC: RRR, no murmurs, rubs, gallops RESPIRATORY:  Clear to auscultation without rales, wheezing or rhonchi  ABDOMEN: Soft,  non-tender, non-distended EXTREMITIES:  No edema; No deformity   ASSESSMENT AND PLAN: .   ***    {Are you ordering a CV Procedure (e.g. stress test, cath, DCCV, TEE, etc)?   Press F2        :[id number]}  Dispo: ***  Signed, Quince Santana, NP

## 2023-04-14 NOTE — Patient Instructions (Signed)
Medication Instructions:  Your physician has recommended you make the following change in your medication:  START: sildenafil (VIAGRA) 25 MG tablet - Take 1 tablet (25 mg total) by mouth daily as needed for erectile dysfunction. 1 Hour prior to intercourse  *If you need a refill on your cardiac medications before your next appointment, please call your pharmacy*  Lab Work: - None ordered  Testing/Procedures: - None ordered  Follow-Up: At [facility], you and your health needs are our priority.  As part of our continuing mission to provide you with exceptional heart care, we have created designated Provider Care Teams.  These Care Teams include your primary Cardiologist (physician) and Advanced Practice Providers (APPs -  Physician Assistants and Nurse Practitioners) who all work together to provide you with the care you need, when you need it.  Your next appointment:   3 - 4 month(s)  Provider:   Charlsie Quest, NP    Other Instructions - None

## 2023-04-23 ENCOUNTER — Ambulatory Visit: Payer: Medicare HMO | Admitting: Adult Health

## 2023-05-01 ENCOUNTER — Ambulatory Visit: Payer: Medicare HMO | Admitting: Adult Health

## 2023-05-07 ENCOUNTER — Other Ambulatory Visit: Payer: Self-pay | Admitting: Physician Assistant

## 2023-05-07 DIAGNOSIS — K219 Gastro-esophageal reflux disease without esophagitis: Secondary | ICD-10-CM

## 2023-05-22 ENCOUNTER — Encounter: Payer: Self-pay | Admitting: Gastroenterology

## 2023-05-26 ENCOUNTER — Encounter: Payer: Self-pay | Admitting: Adult Health

## 2023-05-26 ENCOUNTER — Ambulatory Visit: Payer: Medicare Other | Admitting: Adult Health

## 2023-05-26 VITALS — BP 150/80 | HR 63 | Temp 98.4°F | Ht 71.0 in | Wt 198.2 lb

## 2023-05-26 DIAGNOSIS — G4733 Obstructive sleep apnea (adult) (pediatric): Secondary | ICD-10-CM | POA: Diagnosis not present

## 2023-05-26 DIAGNOSIS — J449 Chronic obstructive pulmonary disease, unspecified: Secondary | ICD-10-CM

## 2023-05-26 DIAGNOSIS — F17210 Nicotine dependence, cigarettes, uncomplicated: Secondary | ICD-10-CM

## 2023-05-26 MED ORDER — ALBUTEROL SULFATE HFA 108 (90 BASE) MCG/ACT IN AERS: 1.0000 | INHALATION_SPRAY | Freq: Four times a day (QID) | RESPIRATORY_TRACT | Status: AC | PRN

## 2023-05-26 NOTE — Progress Notes (Signed)
 @Patient  ID: Thomas Cross, male    DOB: [birth year], 68 y.o.   MRN: [id number]  Chief Complaint  Patient presents with   Follow-up    Referring provider: Cyndi Manuelita RIGGERS  HPI: 68 year old male active smoker followed for COPD and sleep apnea (CPAP intolerant) Participates in the lung cancer CT screening program.  TEST/EVENTS :  Pulmonary function testing 6/7/2024shows moderately severe obstruction without a bronchodilator response, normal lung volumes, decreased diffusion capacity   CT-PA 10/06/2022 hows emphysema, no PE, no mediastinal or hilar adenopathy.  No nodules or masses.  05/26/2023 Follow up : COPD and OSA  Patient presents for 24-month follow-up.  Patient says overall COPD is doing okay.  He denies any flare of cough or wheezing.  Does get short of breath with prolonged walking.  Last visit was tried on Stiolto but had no perceived benefit.  Says he has an albuterol  at home but rarely uses.  Patient does continue to smoke.  We discussed smoking cessation.  Patient has moderate obstructive sleep apnea.  Has tried CPAP in the past but is intolerant.  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  We discussed that underlying sleep apnea will need to be managed and CPAP compliance documented.  Patient says he is not going to use CPAP.  Patient is not a candidate for oral appliance as he is edentulous .  We did discuss the inspire device.  Patient declines inspire..  Patient education was given on sleep apnea.    No Known Allergies  Immunization History  Administered Date(s) Administered   Pneumococcal Polysaccharide-23 11/08/2019   Tdap 03/18/2011    Past Medical History:  Diagnosis Date   Arthritis    GERD (gastroesophageal reflux disease)    RARE-TUMS   Sleep apnea     Tobacco History: Social History   Tobacco Use  Smoking Status Every Day   Current packs/day: 1.50   Average packs/day: 1.5 packs/day for 45.0 years (67.5 ttl pk-yrs)   Types:  Cigarettes  Smokeless Tobacco Never  Tobacco Comments   Smoking 1.5 ppd.  Trying to cut back.  05/25/2022 hfb   Ready to quit: Not Answered Counseling given: Not Answered Tobacco comments: Smoking 1.5 ppd.  Trying to cut back.  05/25/2022 hfb   Outpatient Medications Prior to Visit  Medication Sig Dispense Refill   omeprazole  (PRILOSEC) 40 MG capsule TAKE 1 CAPSULE (40 MG TOTAL) BY MOUTH DAILY. 90 capsule 3   sildenafil  (VIAGRA ) 25 MG tablet Take 1 tablet (25 mg total) by mouth daily as needed for erectile dysfunction. 1 Hour prior to intercourse 10 tablet 0   No facility-administered medications prior to visit.     Review of Systems:   Constitutional:   No  weight loss, night sweats,  Fevers, chills, +fatigue, or  lassitude.  HEENT:   No headaches,  Difficulty swallowing,  Tooth/dental problems, or  Sore throat,                No sneezing, itching, ear ache, nasal congestion, post nasal drip,   CV:  No chest pain,  Orthopnea, PND, swelling in lower extremities, anasarca, dizziness, palpitations, syncope.   GI  No heartburn, indigestion, abdominal pain, nausea, vomiting, diarrhea, change in bowel habits, loss of appetite, bloody stools.   Resp:  No chest wall deformity  Skin: no rash or lesions.  GU: no dysuria, change in color of urine, no urgency or frequency.  No flank pain, no hematuria   MS:  No joint pain or swelling.  No decreased range of motion.  No back pain.    Physical Exam  BP (!) 150/80 (BP Location: Left Arm, Cuff Size: Large)   Pulse 63   Temp 98.4 F (36.9 C) (Oral)   Ht 5' 11 (1.803 m)   Wt 198 lb 3.2 oz (89.9 kg)   SpO2 100%   BMI 27.64 kg/m   GEN: A/Ox3; pleasant , NAD, well nourished    HEENT:  [place]/AT,   NOSE-clear, THROAT-clear, no lesions, no postnasal drip or exudate noted.  Edentulous  NECK:  Supple w/ fair ROM; no JVD; normal carotid impulses w/o bruits; no thyromegaly or nodules palpated; no lymphadenopathy.    RESP  Clear  P & A; w/o,  wheezes/ rales/ or rhonchi. no accessory muscle use, no dullness to percussion  CARD:  RRR, no m/r/g, no peripheral edema, pulses intact, no cyanosis or clubbing.  GI:   Soft & nt; nml bowel sounds; no organomegaly or masses detected.   Musco: Warm bil, no deformities or joint swelling noted.   Neuro: alert, no focal deficits noted.    Skin: Warm, no lesions or rashes    Lab Results:  CBC   BMET   BNP  No results found for: PROBNP  Imaging: No results found.  Administration History     None          Latest Ref Rng & Units 10/24/2022    1:02 PM 12/19/2019   10:23 AM  PFT Results  FVC-Pre L 3.94    FVC-Predicted Pre % 85  81  P  FVC-Post L 3.92  3.84  P  FVC-Predicted Post % 84  81  P  Pre FEV1/FVC % % 60  63  P  Post FEV1/FCV % % 65  64  P  FEV1-Pre L 2.37  2.46  P  FEV1-Predicted Pre % 69  69  P  FEV1-Post L 2.55  2.45  P  DLCO uncorrected ml/min/mmHg 17.87  18.68  P  DLCO UNC% % 66  68  P  DLCO corrected ml/min/mmHg 17.45    DLCO COR %Predicted % 64    DLVA Predicted % 65  65  P  TLC L 6.81  7.48  P  TLC % Predicted % 94  103  P  RV % Predicted % 116  138  P    P Preliminary result    No results found for: NITRICOXIDE      Assessment & Plan:   Assessment and Plan    Chronic Obstructive Pulmonary Disease (COPD) Moderate COPD -no perceived benefit with previous Stiolto.  Encouraged patient on smoking cessation.  Albuterol  as needed.   We emphasized the importance of using albuterol  as a rescue inhaler and the need for smoking cessation to improve overall health. We will prescribe albuterol  as a rescue inhaler, encourage smoking cessation, and continue annual CT scans for lung cancer screening.  Tobacco abuse.  Smoking cessation discussed.  Continue with lung cancer CT chest screening program.  Obstructive Sleep Apnea (OSA) They have moderate OSA-intolerant to CPAP therapy,  We discussed restarting CPAP adjustment of pressures and/or mask.   Patient declines at this time.  We discussed healthy sleep regimen with positional therapy-sleeping with his head of the bed elevated at 30 degrees and avoiding sleeping on his back.   We did discuss this may affect his  CDL license eligibility. We discussed alternative treatments such as  Inspire device, but they were not interested.  We did discuss a repeat sleep study if there is weight loss or a change in symptoms, and discuss adjusting CPAP pressures or trying a different mask if they decide to retry CPAP.  General Health Maintenance They declined the flu vaccination.   Follow-up We will schedule a follow-up with Dr. Shelah in six months and As needed         Madelin Stank, NP 05/26/2023

## 2023-05-26 NOTE — Patient Instructions (Addendum)
 Albuterol inhaler As needed   Work on not smoking .  Do not drive if sleepy Avoid sleeping on your back.  Call back if you change your mind on CPAP .  Yearly CT chest as planned this year.  Follow up with Dr. Delton Coombes  in 6 months and As needed

## 2023-05-29 ENCOUNTER — Ambulatory Visit: Payer: Medicare Other | Admitting: Anesthesiology

## 2023-05-29 ENCOUNTER — Ambulatory Visit
Admission: RE | Admit: 2023-05-29 | Discharge: 2023-05-29 | Disposition: A | Discharge status: Home / Self Care | Payer: Medicare Other | Attending: Gastroenterology | Admitting: Gastroenterology

## 2023-05-29 ENCOUNTER — Encounter
Admission: RE | Disposition: A | Discharge status: Home / Self Care | Payer: Self-pay | Source: Home / Self Care | Attending: Gastroenterology

## 2023-05-29 DIAGNOSIS — F17210 Nicotine dependence, cigarettes, uncomplicated: Secondary | ICD-10-CM | POA: Diagnosis not present

## 2023-05-29 DIAGNOSIS — K2270 Barrett's esophagus without dysplasia: Secondary | ICD-10-CM | POA: Diagnosis not present

## 2023-05-29 DIAGNOSIS — K2289 Other specified disease of esophagus: Secondary | ICD-10-CM | POA: Diagnosis not present

## 2023-05-29 DIAGNOSIS — Z79899 Other long term (current) drug therapy: Secondary | ICD-10-CM | POA: Diagnosis not present

## 2023-05-29 DIAGNOSIS — G4730 Sleep apnea, unspecified: Secondary | ICD-10-CM | POA: Insufficient documentation

## 2023-05-29 DIAGNOSIS — I10 Essential (primary) hypertension: Secondary | ICD-10-CM | POA: Diagnosis not present

## 2023-05-29 DIAGNOSIS — R1310 Dysphagia, unspecified: Secondary | ICD-10-CM

## 2023-05-29 DIAGNOSIS — K219 Gastro-esophageal reflux disease without esophagitis: Secondary | ICD-10-CM | POA: Diagnosis not present

## 2023-05-29 HISTORY — PX: BIOPSY: SHX5522

## 2023-05-29 HISTORY — PX: ESOPHAGOGASTRODUODENOSCOPY (EGD) WITH PROPOFOL: SHX5813

## 2023-05-29 SURGERY — ESOPHAGOGASTRODUODENOSCOPY (EGD) WITH PROPOFOL
Anesthesia: General

## 2023-05-29 MED ORDER — PROPOFOL 500 MG/50ML IV EMUL
INTRAVENOUS | Status: DC | PRN
  Administered 2023-05-29: 125 ug/kg/min via INTRAVENOUS

## 2023-05-29 MED ORDER — PROPOFOL 10 MG/ML IV BOLUS
INTRAVENOUS | Status: DC | PRN
  Administered 2023-05-29: 70 mg via INTRAVENOUS

## 2023-05-29 MED ORDER — SODIUM CHLORIDE 0.9 % IV SOLN
INTRAVENOUS | Status: DC
  Administered 2023-05-29: 1000 mL via INTRAVENOUS

## 2023-05-29 MED ORDER — GLYCOPYRROLATE 0.2 MG/ML IJ SOLN
INTRAMUSCULAR | Status: DC | PRN
  Administered 2023-05-29: .2 mg via INTRAVENOUS

## 2023-05-29 MED ORDER — LIDOCAINE HCL (CARDIAC) PF 100 MG/5ML IV SOSY
PREFILLED_SYRINGE | INTRAVENOUS | Status: DC | PRN
  Administered 2023-05-29: 50 mg via INTRAVENOUS

## 2023-05-29 NOTE — Anesthesia Preprocedure Evaluation (Signed)
 Anesthesia Evaluation  Patient identified by MRN, date of birth, ID band Patient awake    Reviewed: Allergy & Precautions, NPO status , Patient's Chart, lab work & pertinent test results  History of Anesthesia Complications Negative for: history of anesthetic complications  Airway Mallampati: III  TM Distance: <3 FB Neck ROM: full    Dental  (+) Missing, Poor Dentition   Pulmonary shortness of breath and with exertion, sleep apnea , COPD, Current Smoker and Patient abstained from smoking.   Pulmonary exam normal        Cardiovascular hypertension, (-) angina Normal cardiovascular exam     Neuro/Psych negative neurological ROS  negative psych ROS   GI/Hepatic Neg liver ROS,GERD  Controlled,,  Endo/Other  negative endocrine ROS    Renal/GU negative Renal ROS  negative genitourinary   Musculoskeletal   Abdominal   Peds  Hematology negative hematology ROS (+)   Anesthesia Other Findings Past Medical History: No date: Arthritis No date: GERD (gastroesophageal reflux disease)     Comment:  RARE-TUMS No date: Sleep apnea  Past Surgical History: 2009: COLONOSCOPY 07/27/2020: COLONOSCOPY WITH PROPOFOL ; N/A     Comment:  Procedure: COLONOSCOPY WITH PROPOFOL ;  Surgeon: Therisa Bi, MD;  Location: [facility] ENDOSCOPY;  Service:               Gastroenterology;  Laterality: N/A; 30 years ago: HERNIA REPAIR     Comment:  right inguinal 05/20/2016: INGUINAL HERNIA REPAIR; Right     Comment:  Procedure: LAPAROSCOPIC INGUINAL HERNIA;  Surgeon:               Louanne KANDICE Muse, MD;  Location: [facility] ORS;  Service:              General;  Laterality: Right; No date: TONSILLECTOMY AND ADENOIDECTOMY  BMI    Body Mass Index: 27.67 kg/m      Reproductive/Obstetrics negative OB ROS                             Anesthesia Physical Anesthesia Plan  ASA: 3  Anesthesia Plan: General    Post-op Pain Management:    Induction: Intravenous  PONV Risk Score and Plan: Propofol  infusion and TIVA  Airway Management Planned: Natural Airway and Nasal Cannula  Additional Equipment:   Intra-op Plan:   Post-operative Plan:   Informed Consent: I have reviewed the patients History and Physical, chart, labs and discussed the procedure including the risks, benefits and alternatives for the proposed anesthesia with the patient or authorized representative who has indicated his/her understanding and acceptance.     Dental Advisory Given  Plan Discussed with: Anesthesiologist, CRNA and Surgeon  Anesthesia Plan Comments: (Patient consented for risks of anesthesia including but not limited to:  - adverse reactions to medications - risk of airway placement if required - damage to eyes, teeth, lips or other oral mucosa - nerve damage due to positioning  - sore throat or hoarseness - Damage to heart, brain, nerves, lungs, other parts of body or loss of life  Patient voiced understanding and assent.)       Anesthesia Quick Evaluation

## 2023-05-29 NOTE — Anesthesia Postprocedure Evaluation (Signed)
 Anesthesia Post Note  Patient: Thomas Cross  Procedure(s) Performed: ESOPHAGOGASTRODUODENOSCOPY (EGD) WITH PROPOFOL  BIOPSY  Patient location during evaluation: Endoscopy Anesthesia Type: General Level of consciousness: awake and alert Pain management: pain level controlled Vital Signs Assessment: post-procedure vital signs reviewed and stable Respiratory status: spontaneous breathing, nonlabored ventilation, respiratory function stable and patient connected to nasal cannula oxygen Cardiovascular status: blood pressure returned to baseline and stable Postop Assessment: no apparent nausea or vomiting Anesthetic complications: no   No notable events documented.   Last Vitals:  Vitals:   05/29/23 1008 05/29/23 1018  BP: (!) 159/86 (!) 173/89  Pulse: 60 (!) 57  Resp: (!) 26 12  Temp:    SpO2: 99% 100%    Last Pain:  Vitals:   05/29/23 1018  TempSrc:   PainSc: 0-No pain                 Fairy POUR Vickey Boak

## 2023-05-29 NOTE — Transfer of Care (Signed)
 Immediate Anesthesia Transfer of Care Note  Patient: Thomas Cross  Procedure(s) Performed: ESOPHAGOGASTRODUODENOSCOPY (EGD) WITH PROPOFOL  BIOPSY  Patient Location: PACU  Anesthesia Type:General  Level of Consciousness: awake, alert , and oriented  Airway & Oxygen Therapy: Patient Spontanous Breathing  Post-op Assessment: Report given to RN and Post -op Vital signs reviewed and stable  Post vital signs: stable  Last Vitals:  Vitals Value Taken Time  BP 133/72 05/29/23 0958  Temp    Pulse    Resp 17 05/29/23 0958  SpO2 100 % 05/29/23 0958  Vitals shown include unfiled device data.  Last Pain:  Vitals:   05/29/23 0958  TempSrc:   PainSc: 0-No pain         Complications: No notable events documented.

## 2023-05-29 NOTE — Op Note (Signed)
 [facility] Gastroenterology Patient Name: Thomas Cross Procedure Date: 05/29/2023 9:30 AM MRN: [id number] Account #: [id number] Date of Birth: [birth year] Admit Type: Outpatient Age: 68 Room: [facility] ENDO ROOM 4 Gender: Male Note Status: Finalized Instrument Name: Barnie Endoscope [id number] Procedure:             Upper GI endoscopy Indications:           Dysphagia Providers:             Ruel Kung MD, MD Referring MD:          Kelly DASEN. Emilio (Referring MD) Medicines:             Monitored Anesthesia Care Complications:         No immediate complications. Procedure:             Pre-Anesthesia Assessment:                        - Prior to the procedure, a History and Physical was                         performed, and patient medications, allergies and                         sensitivities were reviewed. The patient's tolerance                         of previous anesthesia was reviewed.                        - The risks and benefits of the procedure and the                         sedation options and risks were discussed with the                         patient. All questions were answered and informed                         consent was obtained.                        - ASA Grade Assessment: II - A patient with mild                         systemic disease.                        After obtaining informed consent, the endoscope was                         passed under direct vision. Throughout the procedure,                         the patient's blood pressure, pulse, and oxygen                         saturations were monitored continuously. The Endoscope                         was introduced through  the mouth, and advanced to the                         third part of duodenum. The upper GI endoscopy was                         accomplished with ease. The patient tolerated the                         procedure well. Findings:      The stomach was normal.       The examined duodenum was normal.      The cardia and gastric fundus were normal on retroflexion.      There were esophageal mucosal changes suspicious for short-segment       Barrett's esophagus present in the lower third of the esophagus. The       maximum longitudinal extent of these mucosal changes was 2 cm in length.       Mucosa was biopsied with a cold forceps for histology. A total of 2       specimen bottles were sent to pathology.      Normal mucosa was found in the upper third of the esophagus and in the       middle third of the esophagus. Biopsies were taken with a cold forceps       for histology. Impression:            - Normal stomach.                        - Normal examined duodenum.                        - Esophageal mucosal changes suspicious for                         short-segment Barrett's esophagus. Biopsied.                        - Normal mucosa was found in the upper third of the                         esophagus and in the middle third of the esophagus.                         Biopsied. Recommendation:        - Await pathology results.                        - Discharge patient to home (with escort).                        - Resume previous diet.                        - Continue present medications.                        - Return to GI clinic as previously scheduled. Procedure Code(s):     --- Professional ---  56760, Esophagogastroduodenoscopy, flexible,                         transoral; with biopsy, single or multiple Diagnosis Code(s):     --- Professional ---                        K22.89, Other specified disease of esophagus                        R13.10, Dysphagia, unspecified CPT copyright 2022 American Medical Association. All rights reserved. The codes documented in this report are preliminary and upon coder review may  be revised to meet current compliance requirements. Ruel Kung, MD Ruel Kung MD, MD 05/29/2023 9:53:45  AM This report has been signed electronically. Number of Addenda: 0 Note Initiated On: 05/29/2023 9:30 AM Estimated Blood Loss:  Estimated blood loss: none.      [facility]

## 2023-05-29 NOTE — H&P (Signed)
 Ruel Kung, MD [address], [address], [place], [place], [address] [address], [address], [place], [place], [address] Phone: [phone]  Fax: [phone]  Primary Care Physician:  Emilio Kelly DASEN, FNP   Pre-Procedure History & Physical: HPI:  Thomas Cross is a 68 y.o. male is here for an endoscopy    Past Medical History:  Diagnosis Date   Arthritis    GERD (gastroesophageal reflux disease)    RARE-TUMS   Sleep apnea     Past Surgical History:  Procedure Laterality Date   COLONOSCOPY  2009   COLONOSCOPY WITH PROPOFOL  N/A 07/27/2020   Procedure: COLONOSCOPY WITH PROPOFOL ;  Surgeon: Kung Ruel, MD;  Location: [facility] ENDOSCOPY;  Service: Gastroenterology;  Laterality: N/A;   HERNIA REPAIR  30 years ago   right inguinal   INGUINAL HERNIA REPAIR Right 05/20/2016   Procedure: LAPAROSCOPIC INGUINAL HERNIA;  Surgeon: Louanne KANDICE Muse, MD;  Location: [facility] ORS;  Service: General;  Laterality: Right;   TONSILLECTOMY AND ADENOIDECTOMY      Prior to Admission medications   Medication Sig Start Date End Date Taking? Authorizing Provider  albuterol  (VENTOLIN  HFA) 108 (90 Base) MCG/ACT inhaler Inhale 1-2 puffs into the lungs every 6 (six) hours as needed. 05/26/23   Parrett, Madelin RAMAN, NP  omeprazole  (PRILOSEC) 40 MG capsule TAKE 1 CAPSULE (40 MG TOTAL) BY MOUTH DAILY. 05/07/23 08/05/23  [place], PA-C  sildenafil  (VIAGRA ) 25 MG tablet Take 1 tablet (25 mg total) by mouth daily as needed for erectile dysfunction. 1 Hour prior to intercourse 04/14/23   Gerard Frederick, NP    Allergies as of 04/07/2023   (No Known Allergies)    Family History  Problem Relation Age of Onset   Heart disease Mother    Kidney cancer Mother    Bladder Cancer Mother    Arthritis Mother    CVA Father    Prostate cancer Father    Dementia Father    Hypertension Father    Heart attack Father    Heart attack Brother    Hyperlipidemia Brother    Heart disease Maternal Grandfather    Stroke  Paternal Grandmother    Colon cancer Paternal Grandfather    Hyperlipidemia Sister    Hyperlipidemia Brother    Hyperlipidemia Brother    Hyperlipidemia Brother     Social History   Socioeconomic History   Marital status: Married    Spouse name: Not on file   Number of children: 2   Years of education: Not on file   Highest education level: High school graduate  Occupational History   Occupation: full time / network engineer  Tobacco Use   Smoking status: Every Day    Current packs/day: 1.50    Average packs/day: 1.5 packs/day for 45.0 years (67.5 ttl pk-yrs)    Types: Cigarettes   Smokeless tobacco: Never   Tobacco comments:    Smoking 1.5 ppd.  Trying to cut back.  05/25/2022 hfb  Vaping Use   Vaping status: Never Used  Substance and Sexual Activity   Alcohol use: Yes    Alcohol/week: 14.0 standard drinks of alcohol    Types: 14 Shots of liquor per week    Comment: 2 MIXED DRINKS EVERY DAY   Drug use: No   Sexual activity: Not on file  Other Topics Concern   Not on file  Social History Narrative   Not on file   Social Drivers of Health   Financial Resource Strain: Low Risk  (  10/22/2022)   Overall Financial Resource Strain (CARDIA)    Difficulty of Paying Living Expenses: Not hard at all  Food Insecurity: No Food Insecurity (10/22/2022)   Hunger Vital Sign    Worried About Running Out of Food in the Last Year: Never true    Ran Out of Food in the Last Year: Never true  Transportation Needs: No Transportation Needs (10/22/2022)   PRAPARE - Administrator, Civil Service (Medical): No    Lack of Transportation (Non-Medical): No  Physical Activity: Sufficiently Active (10/22/2022)   Exercise Vital Sign    Days of Exercise per Week: 5 days    Minutes of Exercise per Session: 60 min  Stress: No Stress Concern Present (10/22/2022)   [employer] of Occupational Health - Occupational Stress Questionnaire    Feeling of Stress : Only a little  Social Connections: Socially  Integrated (10/22/2022)   Social Connection and Isolation Panel [NHANES]    Frequency of Communication with Friends and Family: More than three times a week    Frequency of Social Gatherings with Friends and Family: More than three times a week    Attends Religious Services: More than 4 times per year    Active Member of [employer] or Organizations: Yes    Attends Engineer, Structural: More than 4 times per year    Marital Status: Married  Catering Manager Violence: Not At Risk (10/22/2022)   Humiliation, Afraid, Rape, and Kick questionnaire    Fear of Current or Ex-Partner: No    Emotionally Abused: No    Physically Abused: No    Sexually Abused: No    Review of Systems: See HPI, otherwise negative ROS  Physical Exam: There were no vitals taken for this visit. General:   Alert,  pleasant and cooperative in NAD Head:  Normocephalic and atraumatic. Neck:  Supple; no masses or thyromegaly. Lungs:  Clear throughout to auscultation, normal respiratory effort.    Heart:  +S1, +S2, Regular rate and rhythm, No edema. Abdomen:  Soft, nontender and nondistended. Normal bowel sounds, without guarding, and without rebound.   Neurologic:  Alert and  oriented x4;  grossly normal neurologically.  Impression/Plan: Thomas Cross is here for an endoscopy  to be performed for  evaluation of dysphagia.    Risks, benefits, limitations, and alternatives regarding endoscopy have been reviewed with the patient.  Questions have been answered.  All parties agreeable.   Ruel Kung, MD  05/29/2023, 8:26 AM

## 2023-06-01 ENCOUNTER — Encounter: Payer: Self-pay | Admitting: Gastroenterology

## 2023-06-01 LAB — SURGICAL PATHOLOGY

## 2023-07-21 ENCOUNTER — Ambulatory Visit: Payer: Medicare Other | Attending: Cardiology | Admitting: Cardiology

## 2023-07-21 ENCOUNTER — Encounter: Payer: Self-pay | Admitting: Cardiology

## 2023-07-21 ENCOUNTER — Encounter: Payer: Self-pay | Admitting: Family Medicine

## 2023-07-21 VITALS — BP 140/80 | HR 60 | Ht 72.0 in | Wt 197.0 lb

## 2023-07-21 DIAGNOSIS — J449 Chronic obstructive pulmonary disease, unspecified: Secondary | ICD-10-CM

## 2023-07-21 DIAGNOSIS — I10 Essential (primary) hypertension: Secondary | ICD-10-CM | POA: Diagnosis not present

## 2023-07-21 DIAGNOSIS — E782 Mixed hyperlipidemia: Secondary | ICD-10-CM | POA: Diagnosis not present

## 2023-07-21 DIAGNOSIS — F17200 Nicotine dependence, unspecified, uncomplicated: Secondary | ICD-10-CM

## 2023-07-21 DIAGNOSIS — G4733 Obstructive sleep apnea (adult) (pediatric): Secondary | ICD-10-CM

## 2023-07-21 NOTE — Patient Instructions (Signed)
 Medication Instructions:  Your physician recommends that you continue on your current medications as directed. Please refer to the Current Medication list given to you today.    *If you need a refill on your cardiac medications before your next appointment, please call your pharmacy*   Lab Work: None    If you have labs (blood work) drawn today and your tests are completely normal, you will receive your results only by: MyChart Message (if you have MyChart) OR A paper copy in the mail If you have any lab test that is abnormal or we need to change your treatment, we will call you to review the results.   Testing/Procedures: None    Follow-Up: At [facility], you and your health needs are our priority.  As part of our continuing mission to provide you with exceptional heart care, we have created designated Provider Care Teams.  These Care Teams include your primary Cardiologist (physician) and Advanced Practice Providers (APPs -  Physician Assistants and Nurse Practitioners) who all work together to provide you with the care you need, when you need it.  We recommend signing up for the patient portal called "MyChart".  Sign up information is provided on this After Visit Summary.  MyChart is used to connect with patients for Virtual Visits (Telemedicine).  Patients are able to view lab/test results, encounter notes, upcoming appointments, etc.  Non-urgent messages can be sent to your provider as well.   To learn more about what you can do with MyChart, go to [email or web address].    Your next appointment:   6 month(s)  The format for your next appointment:   In Person  Provider:   You will see one of the following Advanced Practice Providers on your designated Care Team:   Nicolasa Ducking, NP Eula Listen, PA-C Cadence Fransico Michael, PA-C Charlsie Quest, NP Carlos Levering, NP     Other Instructions

## 2023-07-21 NOTE — Progress Notes (Signed)
 Cardiology Office Note:  .   Date:  07/21/2023  ID:  Thomas Cross, DOB [birth year], MRN [id number] PCP: Jacky Kindle, FNP  [facility] Providers Cardiologist:  Debbe Odea, MD    History of Present Illness: Thomas Cross is a 67 y.o. male with past medical history of Gastrosoft reflux disease, arthritis, current smoker x 20+ years, obstructive sleep apnea, COPD, palpitations, who presents today for follow-up.   Prior echocardiogram completed 09/2019 revealed an LVEF of 55 to 60%, no RWMA.  ZIO monitor 06/2021 for complaint of palpitations revealed some runs of SVT.  PFTs were completed 01/2020 which revealed the patient had COPD Gold stage II with a diffusion defect likely emphysema with emphasis placed on smoking cessation.  Repeat echocardiogram completed 11/17/2022 revealed LVEF 55 to 60%, no RWMA, and no valvular abnormalities.  Cardiac PET stress test completed 03/08/2023 with LV perfusion that was normal with no evidence of ischemia or infarct.  Myocardial blood flow was computed and considered normal.  Coronary calcium was absent on the attenuation correction CT images.  The study was normal and considered low risk.    He was last seen in clinic 04/14/2023 and overall been doing well from a cardiac perspective.  He continued to have chronic shortness of breath and dyspnea on exertion occasionally and chest discomfort that was more musculoskeletal.  He continued to follow with GI who continued him on PPI therapy for silent acid reflux he also had upcoming EGD that was scheduled to determine the origination of his symptoms.  He was continued on his previous medication regimen with no further testing that was ordered at the time.  He returns to clinic today stating that he has been doing well from the cardiac perspective.  He continues to have chronic shortness of breath that is unchanged.  Recently followed up with GI and underwent endoscopic which she said as far as he knows that  with no findings.  He has been continued on acid reflux medications.  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  Denies any hospitalizations or visits to the emergency department.  ROS: 10 point review of systems has been reviewed and considered negative with exception was been listed in the HPI  Studies Reviewed: .        Cardiac PET 03/12/23   LV perfusion is normal. There is no evidence of ischemia. There is no evidence of infarction.   Rest left ventricular function is normal. Rest EF: 59%. Stress left ventricular function is normal. Stress EF: 57%. End diastolic cavity size is normal. End systolic cavity size is normal.   Myocardial blood flow was computed to be 0.26ml/g/min at rest and 2.59ml/g/min at stress. Global myocardial blood flow reserve was 2.58 and was normal.   Coronary calcium was absent on the attenuation correction CT images.   The study is normal. The study is low risk.   TTE 11/17/22 1. Left ventricular ejection fraction, by estimation, is 55 to 60%. The  left ventricle has normal function. The left ventricle has no regional  wall motion abnormalities. Left ventricular diastolic parameters were  normal.   2. Right ventricular systolic function is normal. The right ventricular  size is mildly enlarged.   3. The mitral valve is normal in structure. No evidence of mitral valve  regurgitation.   4.  The aortic valve is tricuspid. Aortic valve regurgitation is not  visualized.   5. The inferior vena cava is normal in size with greater than 50%  respiratory variability, suggesting right atrial pressure of 3 mmHg.    Zio monitor 07/16/21 Normal sinus rhythm Patient had a min HR of 42 bpm, max HR of 169 bpm, and avg HR of 64 bpm.    22 Supraventricular Tachycardia runs occurred, the run with the fastest interval lasting 9 beats with a max rate  of 169 bpm, the longest lasting 15 beats with an avg rate of 108 bpm.   Isolated SVEs were rare (<1.0%), SVE Couplets were rare (<1.0%), and SVE Triplets were rare (<1.0%).   Isolated VEs were rare (<1.0%), and no VE Couplets or VE Triplets were present.    1 patient triggered events associated with normal sinus rhythm Other events not triggered   TTE 09/29/19 1. Left ventricular ejection fraction, by estimation, is 55 to 60%. The  left ventricle has normal function. The left ventricle has no regional  wall motion abnormalities. Left ventricular diastolic parameters were  normal.   2. Right ventricular systolic function is normal. The right ventricular  size is normal.   3. Left atrial size was mildly dilated.   4. The inferior vena cava is dilated in size with >50% respiratory  variability, suggesting right atrial pressure of 8 mmHg.  Risk Assessment/Calculations:     HYPERTENSION CONTROL Vitals:   07/21/23 1012 07/21/23 1025  BP: (!) 155/81 (!) 140/80    The patient's blood pressure is elevated above target today.  In order to address the patient's elevated BP:           Physical Exam:   VS:  BP (!) 140/80 (BP Location: Left Arm, Patient Position: Sitting, Cuff Size: Normal)   Pulse 60   Ht 6' (1.829 m)   Wt 197 lb (89.4 kg)   SpO2 98%   BMI 26.72 kg/m    Wt Readings from Last 3 Encounters:  07/21/23 197 lb (89.4 kg)  05/29/23 198 lb 6.6 oz (90 kg)  05/26/23 198 lb 3.2 oz (89.9 kg)    GEN: Well nourished, well developed in no acute distress NECK: No JVD; No carotid bruits CARDIAC: RRR, no murmurs, rubs, gallops RESPIRATORY:  Clear to auscultation without rales, wheezing or rhonchi  ABDOMEN: Soft, non-tender, non-distended EXTREMITIES:  No edema; No deformity   ASSESSMENT AND PLAN: .   Chronic shortness of breath dyspnea on exertion with COPD continued tobacco abuse where he states he is down to a pack and 1/2/day from previous 2 packs.  He continues to follow  with pulmonary.  Previous PFTs revealed moderate to severe obstructive COPD and slight progression which was not surprising with his continued tobacco abuse.  Continues to have a rescue inhaler.  Is no longer taking any regularly scheduled inhalers previously prescribed from pulmonary as he found no benefit.  Shortness of breath has unchanged.  Hypertension with blood pressure today 155/81 and 140/80.  States blood pressure typically does run slightly lower when he is not in the doctor's office.  He continues to decline any antihypertensive medications.  Has been encouraged to limit sodium intake and continue to monitor his pressures outside of the physician's office.  Mixed hyperlipidemia with a last LDL 149.  Continues to decline statin therapy.  Obstructive sleep apnea where previously he was noncompliant with CPAP.  States that he was advised by one of his providers that it was  not a necessity.  Encouraged to continue to follow with pulmonary as we are unable to offer him a letter stating he no longer needs CPAP.  Current smoker congratulated on decreasing from 2 packs to 1/2 packs/day.  Total cessation is recommended.      Dispo: Patient return to clinic to see MD/APP in 6 months or sooner if needed  Signed, Izella Ybanez, NP

## 2023-08-10 ENCOUNTER — Ambulatory Visit: Payer: Medicare Other | Admitting: Gastroenterology

## 2023-08-10 VITALS — BP 169/90 | HR 60 | Temp 98.1°F | Wt 194.6 lb

## 2023-08-10 DIAGNOSIS — K2270 Barrett's esophagus without dysplasia: Secondary | ICD-10-CM

## 2023-08-10 DIAGNOSIS — K219 Gastro-esophageal reflux disease without esophagitis: Secondary | ICD-10-CM

## 2023-08-10 DIAGNOSIS — F17210 Nicotine dependence, cigarettes, uncomplicated: Secondary | ICD-10-CM | POA: Diagnosis not present

## 2023-08-10 MED ORDER — OMEPRAZOLE 40 MG PO CPDR
40.0000 mg | DELAYED_RELEASE_CAPSULE | Freq: Every day | ORAL | 3 refills | Status: AC
Start: 2023-08-10 — End: ?

## 2023-08-10 NOTE — Progress Notes (Signed)
 Thomas Mood MD, MRCP(U.K) [address]  [address]  [place], [place] [address]  Main: [phone]  Fax: [phone]   Primary Care Physician: Thomas Kindle, FNP  Primary Gastroenterologist:  Dr. Wyline Cross   Chief Complaint  Patient presents with   Barrett's esophagus    HPI: Thomas Cross is a 68 y.o. male   Summary of history :  Initially seen by Thomas Cross in November 2024 for GERD, voice hoarseness, dysphagia, reflux for many years.  Treated with OTC Prilosec , colonoscopy in March 2022 by myself showed 2 small sessile serrated polyps should be repeated in 2027.  Smoker of 40 years duration with moderate to severe COPD.  Interval history   04/07/2023-08/10/2023   I performed an upper endoscopy in January 2025.  EGD showed a 2 cm extent of short segment Barrett's esophagus biopsies confirm the same.  No dysplasia noted.  There are also features of reflux seen in the biopsies. He is doing well denies any symptoms on omeprazole 40 mg once a day.  No other complaints.  Current Outpatient Medications  Medication Sig Dispense Refill   albuterol (VENTOLIN HFA) 108 (90 Base) MCG/ACT inhaler Inhale 1-2 puffs into the lungs every 6 (six) hours as needed.     sildenafil (VIAGRA) 25 MG tablet Take 1 tablet (25 mg total) by mouth daily as needed for erectile dysfunction. 1 Hour prior to intercourse 10 tablet 0   omeprazole (PRILOSEC) 40 MG capsule TAKE 1 CAPSULE (40 MG TOTAL) BY MOUTH DAILY. 90 capsule 3   No current facility-administered medications for this visit.    Allergies as of 08/10/2023   (No Known Allergies)      ROS:  General: Negative for anorexia, weight loss, fever, chills, fatigue, weakness. ENT: Negative for hoarseness, difficulty swallowing , nasal congestion. CV: Negative for chest pain, angina, palpitations, dyspnea on exertion, peripheral edema.  Respiratory: Negative for dyspnea at rest, dyspnea on exertion, cough, sputum, wheezing.  GI: See  history of present illness. GU:  Negative for dysuria, hematuria, urinary incontinence, urinary frequency, nocturnal urination.  Endo: Negative for unusual weight change.    Physical Examination:   BP (!) 169/90   Pulse 60   Temp 98.1 F (36.7 C) (Oral)   Wt 194 lb 9.6 oz (88.3 kg)   BMI 26.39 kg/m   General: Well-nourished, well-developed in no acute distress.  Eyes: No icterus. Conjunctivae pink. Mouth: Oropharyngeal mucosa moist and pink , no lesions erythema or exudate. Neuro: Alert and oriented x 3.  Grossly intact. Skin: Warm and dry, no jaundice.   Psych: Alert and cooperative, normal Cross and affect.   Imaging Studies: No results found.  Assessment and Plan:   Thomas Cross is a 68 y.o. y/o male here to follow-up for history of GERD.  Recent upper endoscopy showed features of short segment Barrett's esophagus with no dysplasia.  History of smoking.  He is on omeprazole 20 mg once a day  Plan 1.  Needs to continue omeprazole long-term indefinitely due to presence of Barrett's esophagus.  Risks versus benefits discussed not limited but including possible thinning of the bones, increased risk of infection, low magnesium, balancing the risk for the benefits decided that the benefits exceed the risks and hence he will continue on the lowest dose possible.  2.  Continue lifestyle changes including keeping the head end of the bed elevated, avoid eating for 2 hours before bedtime.  Taking the PPI for Thomas Cross in the  morning empty stomach and eating 30 minutes after.  Losing weight.  Repeat EGD in 3 years.  Advised to stop smoking    Dr Thomas Mood  MD,MRCP [facility]) Follow up in 1 year

## 2023-08-10 NOTE — Patient Instructions (Signed)
 We will put you in our recall list for a 1 year follow up.  Your next EGD will be due in 3 years. We will reach out to you so we could could schedule your upper endoscopy.

## 2023-11-11 ENCOUNTER — Ambulatory Visit

## 2023-11-11 ENCOUNTER — Encounter: Payer: Self-pay | Admitting: Family Medicine

## 2023-11-11 ENCOUNTER — Ambulatory Visit (INDEPENDENT_AMBULATORY_CARE_PROVIDER_SITE_OTHER): Admitting: Family Medicine

## 2023-11-11 VITALS — BP 136/79 | HR 60 | Resp 16 | Ht 72.0 in | Wt 191.3 lb

## 2023-11-11 DIAGNOSIS — F17210 Nicotine dependence, cigarettes, uncomplicated: Secondary | ICD-10-CM

## 2023-11-11 DIAGNOSIS — Z0000 Encounter for general adult medical examination without abnormal findings: Secondary | ICD-10-CM | POA: Diagnosis not present

## 2023-11-11 DIAGNOSIS — I10 Essential (primary) hypertension: Secondary | ICD-10-CM | POA: Diagnosis not present

## 2023-11-11 DIAGNOSIS — Z0001 Encounter for general adult medical examination with abnormal findings: Secondary | ICD-10-CM | POA: Diagnosis not present

## 2023-11-11 DIAGNOSIS — J449 Chronic obstructive pulmonary disease, unspecified: Secondary | ICD-10-CM

## 2023-11-11 DIAGNOSIS — Z79899 Other long term (current) drug therapy: Secondary | ICD-10-CM | POA: Diagnosis not present

## 2023-11-11 DIAGNOSIS — E559 Vitamin D deficiency, unspecified: Secondary | ICD-10-CM

## 2023-11-11 DIAGNOSIS — K219 Gastro-esophageal reflux disease without esophagitis: Secondary | ICD-10-CM

## 2023-11-11 DIAGNOSIS — E782 Mixed hyperlipidemia: Secondary | ICD-10-CM | POA: Diagnosis not present

## 2023-11-11 DIAGNOSIS — Z716 Tobacco abuse counseling: Secondary | ICD-10-CM

## 2023-11-11 DIAGNOSIS — N182 Chronic kidney disease, stage 2 (mild): Secondary | ICD-10-CM

## 2023-11-11 DIAGNOSIS — I700 Atherosclerosis of aorta: Secondary | ICD-10-CM | POA: Diagnosis not present

## 2023-11-11 MED ORDER — NICOTINE 21 MG/24HR TD PT24: 21.0000 mg | MEDICATED_PATCH | Freq: Every day | TRANSDERMAL | 1 refills | Status: AC

## 2023-11-11 NOTE — Patient Instructions (Addendum)
 [name] , Thank you for taking time out of your busy schedule to complete your Annual Wellness Visit with me. I enjoyed our conversation and look forward to speaking with you again next year. I, as well as your care team,  appreciate your ongoing commitment to your health goals. Please review the following plan we discussed and let me know if I can assist you in the future.   Follow up Visits: Next Medicare AWV with our clinical staff:   11/16/24 @ 10:50 AM BY PHONE Have you seen your provider in the last 6 months (3 months if uncontrolled diabetes)? Yes  Clinician Recommendations:  Aim for 30 minutes of exercise or brisk walking, 6-8 glasses of water, and 5 servings of fruits and vegetables each day. TAKE CARE!      This is a list of the screening recommended for you and due dates:  Health Maintenance  Topic Date Due   Pneumococcal Vaccine for age over 77 (2 of 2 - PCV) 11/07/2020   Screening for Lung Cancer  10/06/2023   Zoster (Shingles) Vaccine (1 of 2) 02/11/2024*   COVID-19 Vaccine (3 - Pfizer risk series) 02/17/2024*   DTaP/Tdap/Td vaccine (2 - Td or Tdap) 11/10/2024*   Medicare Annual Wellness Visit  11/10/2024   Colon Cancer Screening  07/28/2027   Hepatitis C Screening  Completed   Hepatitis B Vaccine  Aged Out   HPV Vaccine  Aged Out   Meningitis B Vaccine  Aged Out   Flu Shot  Discontinued  *Topic was postponed. The date shown is not the original due date.    Advanced directives: (ACP Link)Information on Advanced Care Planning can be found at [place]  [facility] Advance Health Care Directives Advance Health Care Directives. [email or web address]  Advance Care Planning is important because it:  [x]  Makes sure you receive the medical care that is consistent with your values, goals, and preferences  [x]  It provides guidance to your family and loved ones and reduces their decisional burden about whether or not they are making the right decisions based on your  wishes.  Follow the link provided in your after visit summary or read over the paperwork we have mailed to you to help you started getting your Advance Directives in place. If you need assistance in completing these, please reach out to us  so that we can help you!

## 2023-11-11 NOTE — Progress Notes (Signed)
 Subjective:   Thomas Cross is a 68 y.o. who presents for a Medicare Wellness preventive visit.  As a reminder, Annual Wellness Visits don't include a physical exam, and some assessments may be limited, especially if this visit is performed virtually. We may recommend an in-person follow-up visit with your provider if needed.  Visit Complete: Virtual I connected with  [name] on 11/11/23 by a audio enabled telemedicine application and verified that I am speaking with the correct person using two identifiers.  Patient Location: Home  Provider Location: Home Office  I discussed the limitations of evaluation and management by telemedicine. The patient expressed understanding and agreed to proceed.  Vital Signs: Because this visit was a virtual/telehealth visit, some criteria may be missing or patient reported. Any vitals not documented were not able to be obtained and vitals that have been documented are patient reported.  VideoDeclined- This patient declined Librarian, academic. Therefore the visit was completed with audio only.  Persons Participating in Visit: Patient.  AWV Questionnaire: No: Patient Medicare AWV questionnaire was not completed prior to this visit.  Cardiac Risk Factors include: advanced age (>78men, >26 women);hypertension;dyslipidemia;male gender;smoking/ tobacco exposure     Objective:    There were no vitals filed for this visit. There is no height or weight on file to calculate BMI.     11/11/2023   11:39 AM 05/29/2023    8:58 AM 10/22/2022    8:30 AM 07/01/2021    1:58 PM 07/27/2020    7:17 AM 06/25/2020    2:58 PM 05/09/2016   12:08 PM  Advanced Directives  Does Patient Have a Medical Advance Directive? No No No No No No No   Would patient like information on creating a medical advance directive? No - Patient declined   No - Patient declined  No - Patient declined      Data saved with a previous flowsheet row definition     Current Medications (verified) Outpatient Encounter Medications as of 11/11/2023  Medication Sig   albuterol  (VENTOLIN  HFA) 108 (90 Base) MCG/ACT inhaler Inhale 1-2 puffs into the lungs every 6 (six) hours as needed.   nicotine (NICODERM CQ - DOSED IN MG/24 HOURS) 21 mg/24hr patch Place 1 patch (21 mg total) onto the skin daily.   omeprazole  (PRILOSEC) 40 MG capsule Take 1 capsule (40 mg total) by mouth daily.   sildenafil  (VIAGRA ) 25 MG tablet Take 1 tablet (25 mg total) by mouth daily as needed for erectile dysfunction. 1 Hour prior to intercourse   No facility-administered encounter medications on file as of 11/11/2023.    Allergies (verified) Patient has no known allergies.   History: Past Medical History:  Diagnosis Date   Arthritis    Elevated BP without diagnosis of hypertension 06/13/2021   GERD (gastroesophageal reflux disease)    RARE-TUMS   Sleep apnea    Past Surgical History:  Procedure Laterality Date   BIOPSY  05/29/2023   Procedure: BIOPSY;  Surgeon: Therisa Bi, MD;  Location: [facility] ENDOSCOPY;  Service: Gastroenterology;;   COLONOSCOPY  2009   COLONOSCOPY WITH PROPOFOL  N/A 07/27/2020   Procedure: COLONOSCOPY WITH PROPOFOL ;  Surgeon: Therisa Bi, MD;  Location: [facility] ENDOSCOPY;  Service: Gastroenterology;  Laterality: N/A;   ESOPHAGOGASTRODUODENOSCOPY (EGD) WITH PROPOFOL  N/A 05/29/2023   Procedure: ESOPHAGOGASTRODUODENOSCOPY (EGD) WITH PROPOFOL ;  Surgeon: Therisa Bi, MD;  Location: [facility] ENDOSCOPY;  Service: Gastroenterology;  Laterality: N/A;   HERNIA REPAIR  30 years ago   right inguinal  INGUINAL HERNIA REPAIR Right 05/20/2016   Procedure: LAPAROSCOPIC INGUINAL HERNIA;  Surgeon: Louanne KANDICE Muse, MD;  Location: [facility] ORS;  Service: General;  Laterality: Right;   TONSILLECTOMY AND ADENOIDECTOMY     Family History  Problem Relation Age of Onset   Heart disease Mother    Kidney cancer Mother    Bladder Cancer Mother    Arthritis Mother    CVA Father     Prostate cancer Father    Dementia Father    Hypertension Father    Heart attack Father    Heart attack Brother    Hyperlipidemia Brother    Heart disease Maternal Grandfather    Stroke Paternal Grandmother    Colon cancer Paternal Grandfather    Hyperlipidemia Sister    Hyperlipidemia Brother    Hyperlipidemia Brother    Hyperlipidemia Brother    Social History   Socioeconomic History   Marital status: Married    Spouse name: Not on file   Number of children: 2   Years of education: Not on file   Highest education level: High school graduate  Occupational History   Occupation: full time / Network engineer  Tobacco Use   Smoking status: Every Day    Current packs/day: 1.50    Average packs/day: 1.5 packs/day for 45.0 years (67.5 ttl pk-yrs)    Types: Cigarettes   Smokeless tobacco: Never   Tobacco comments:    Smoking 1.5 ppd.  Trying to cut back.  05/25/2022 hfb  Vaping Use   Vaping status: Never Used  Substance and Sexual Activity   Alcohol use: Yes    Alcohol/week: 14.0 standard drinks of alcohol    Types: 14 Shots of liquor per week    Comment: 2 MIXED DRINKS EVERY DAY   Drug use: No   Sexual activity: Not on file  Other Topics Concern   Not on file  Social History Narrative   Not on file   Social Drivers of Health   Financial Resource Strain: Low Risk  (11/11/2023)   Overall Financial Resource Strain (CARDIA)    Difficulty of Paying Living Expenses: Not hard at all  Food Insecurity: No Food Insecurity (11/11/2023)   Hunger Vital Sign    Worried About Running Out of Food in the Last Year: Never true    Ran Out of Food in the Last Year: Never true  Transportation Needs: No Transportation Needs (11/11/2023)   PRAPARE - Administrator, Civil Service (Medical): No    Lack of Transportation (Non-Medical): No  Physical Activity: Sufficiently Active (11/11/2023)   Exercise Vital Sign    Days of Exercise per Week: 7 days    Minutes of Exercise per Session: 50 min   Stress: No Stress Concern Present (11/11/2023)   [employer] of Occupational Health - Occupational Stress Questionnaire    Feeling of Stress: Not at all  Social Connections: Moderately Integrated (11/11/2023)   Social Connection and Isolation Panel    Frequency of Communication with Friends and Family: More than three times a week    Frequency of Social Gatherings with Friends and Family: More than three times a week    Attends Religious Services: 1 to 4 times per year    Active Member of [employer] or Organizations: No    Attends Banker Meetings: Never    Marital Status: Married    Tobacco Counseling Ready to quit: Not Answered Counseling given: Not Answered Tobacco comments: Smoking 1.5 ppd.  Trying to cut back.  05/25/2022 hfb    Clinical Intake:  Pre-visit preparation completed: Yes  Pain : No/denies pain     BMI - recorded: 26.3 Nutritional Status: BMI 25 -29 Overweight Nutritional Risks: None Diabetes: No  Lab Results  Component Value Date   HGBA1C 5.2 07/26/2021     How often do you need to have someone help you when you read instructions, pamphlets, or other written materials from your doctor or pharmacy?: 1 - Never  Interpreter Needed?: No  Information entered by :: JHONNIE DAS, LPN   Activities of Daily Living    11/11/2023   11:40 AM  In your present state of health, do you have any difficulty performing the following activities:  Hearing? 0  Vision? 0  Difficulty concentrating or making decisions? 0  Walking or climbing stairs? 0  Dressing or bathing? 0  Doing errands, shopping? 0  Preparing Food and eating ? N  Using the Toilet? N  In the past six months, have you accidently leaked urine? N  Do you have problems with loss of bowel control? N  Managing your Medications? N  Managing your Finances? N  Housekeeping or managing your Housekeeping? N    Patient Care Team: Donzella Lauraine SAILOR, DO as PCP - General (Family  Medicine) Darliss Rogue, MD as PCP - Cardiology (Cardiology) Portia Fireman, OD (Optometry) Pardue, Lauraine SAILOR, DO as Consulting Physician (Family Medicine)  I have updated your Care Teams any recent Medical Services you may have received from other providers in the past year.     Assessment:   This is a routine wellness examination for Nollie.  Hearing/Vision screen Hearing Screening - Comments:: NO AIDS Vision Screening - Comments:: [facility]   Goals Addressed             This Visit's Progress    DIET - INCREASE WATER INTAKE         Depression Screen     11/11/2023   11:36 AM 11/11/2023    9:49 AM 10/22/2022    8:28 AM 05/02/2022    9:10 AM 12/20/2021    2:15 PM 07/01/2021    1:55 PM 06/13/2021    2:19 PM  PHQ 2/9 Scores  PHQ - 2 Score 0 0 0 0 0 0 0  PHQ- 9 Score 0 0  2 2 0 5    Fall Risk     11/11/2023   11:40 AM 11/11/2023    9:48 AM 05/26/2023   10:28 AM 10/22/2022    8:25 AM 05/02/2022    9:10 AM  Fall Risk   Falls in the past year? 0 0 0 0 0  Number falls in past yr: 0 0  0 0  Injury with Fall? 0 0  0 0  Risk for fall due to : No Fall Risks No Fall Risks  No Fall Risks No Fall Risks  Follow up Falls evaluation completed   Education provided;Falls prevention discussed Falls evaluation completed      Data saved with a previous flowsheet row definition    MEDICARE RISK AT HOME:  Medicare Risk at Home Any stairs in or around the home?: Yes If so, are there any without handrails?: No Home free of loose throw rugs in walkways, pet beds, electrical cords, etc?: Yes Adequate lighting in your home to reduce risk of falls?: Yes Life alert?: No Use of a cane, walker or w/c?: No Grab bars in the bathroom?: No Shower chair or bench in shower?: Yes Elevated  toilet seat or a handicapped toilet?: Yes  TIMED UP AND GO:  Was the test performed?  No  Cognitive Function: 6CIT completed        11/11/2023   11:42 AM 10/22/2022    8:38 AM  03/11/2016    1:45 PM  6CIT Screen  What Year? 0 points 0 points 0 points  What month? 0 points 0 points 0 points  What time? 0 points 0 points 0 points  Count back from 20 0 points 0 points 0 points  Months in reverse 2 points 0 points 2 points  Repeat phrase 0 points 0 points 2 points  Total Score 2 points 0 points 4 points    Immunizations Immunization History  Administered Date(s) Administered   PFIZER(Purple Top)SARS-COV-2 Vaccination 01/05/2020, 01/26/2020   Pneumococcal Polysaccharide-23 11/08/2019   Tdap 03/18/2011    Screening Tests Health Maintenance  Topic Date Due   Pneumococcal Vaccine: 50+ Years (2 of 2 - PCV) 11/07/2020   Lung Cancer Screening  10/06/2023   Zoster Vaccines- Shingrix (1 of 2) 02/11/2024 (Originally 10/03/1974)   COVID-19 Vaccine (3 - Pfizer risk series) 02/17/2024 (Originally 02/23/2020)   DTaP/Tdap/Td (2 - Td or Tdap) 11/10/2024 (Originally 03/17/2021)   Medicare Annual Wellness (AWV)  11/10/2024   Colonoscopy  07/28/2027   Hepatitis C Screening  Completed   Hepatitis B Vaccines  Aged Out   HPV VACCINES  Aged Out   Meningococcal B Vaccine  Aged Out   INFLUENZA VACCINE  Discontinued    Health Maintenance  Health Maintenance Due  Topic Date Due   Pneumococcal Vaccine: 50+ Years (2 of 2 - PCV) 11/07/2020   Lung Cancer Screening  10/06/2023   Health Maintenance Items Addressed: UP TO DATE ON COLONOSCOPY; NEEDS PNA, TDAP, & SHINGRIX; WANTS NO MORE COVIDS  Additional Screening:  Vision Screening: Recommended annual ophthalmology exams for early detection of glaucoma and other disorders of the eye. Would you like a referral to an eye doctor? No    Dental Screening: Recommended annual dental exams for proper oral hygiene  Community Resource Referral / Chronic Care Management: CRR required this visit?  No   CCM required this visit?  No   Plan:    I have personally reviewed and noted the following in the patient's chart:   Medical  and social history Use of alcohol, tobacco or illicit drugs  Current medications and supplements including opioid prescriptions. Patient is not currently taking opioid prescriptions. Functional ability and status Nutritional status Physical activity Advanced directives List of other physicians Hospitalizations, surgeries, and ER visits in previous 12 months Vitals Screenings to include cognitive, depression, and falls Referrals and appointments  In addition, I have reviewed and discussed with patient certain preventive protocols, quality metrics, and best practice recommendations. A written personalized care plan for preventive services as well as general preventive health recommendations were provided to patient.   Jhonnie GORMAN Das, LPN   [id number]   After Visit Summary: (MyChart) Due to this being a telephonic visit, the after visit summary with patients personalized plan was offered to patient via MyChart   Notes: Nothing significant to report at this time.

## 2023-11-11 NOTE — Progress Notes (Signed)
 Complete physical exam   Patient: Thomas Cross   DOB: 04-Feb-1956   68 y.o. Male  MRN: [id number] Visit Date: 11/11/2023  Today's healthcare provider: LAURAINE LOISE BUOY, DO   Chief Complaint  Patient presents with   Annual Exam    Sleeping Pattern: Good Exercising: Daily  No concerns   Subjective    Thomas Cross is a 68 y.o. male who presents today for a complete physical exam.  He reports consuming a general diet. The patient has a physically strenuous job, but has no regular exercise apart from work.  He generally feels well. He reports sleeping fairly well. He does not have additional problems to discuss today.  HPI HPI     Annual Exam    Additional comments: Sleeping Pattern: Good Exercising: Daily  No concerns      Last edited by Wilfred Hargis RAMAN, CMA on 11/11/2023  9:44 AM.      Thomas Cross is a 68 year old male who presents for a physical exam and smoking cessation support.  He smokes 1.5 to 2 packs of cigarettes daily for over fifty years and has attempted to quit in the past. 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 He has not tried nicotine patches or medications but is interested in trying the nicotine patch.  He has a history of lung cancer screening with a pulmonologist in [place]. He is unsure of the date of his last CT scan but recalls it was due in May. He does not use computers and relies on his family for MyChart updates.  He also recently had a cardiac perfusion study, which was interpreted as normal/low risk by cardiology.  He experiences occasional shortness of breath, particularly during stressful times, such as dealing with the farm sale. He has visited the emergency room for these symptoms, where no issues were found. His family attributes these episodes to stress.  He does not regularly check his blood pressure at home but has a device for it. He reports previously being told, years ago, A reading  of 150 mmHg was not concerning. He does not take any medication for blood pressure.  He has a history of using a CPAP machine but discontinued it due to intolerance. He is retired and no longer requires a Psychiatrist, which was affected by his CPAP use.  He takes omeprazole  for silent heartburn, diagnosed after persistent throat soreness. He has not had recent blood work and does not take any cholesterol medication due to past adverse reactions and family members.  He reports occasional dizziness and lightheadedness, especially in hot weather, but denies any specific triggers such as positional changes. No recent abnormal heart rhythms.  He has not had any recent vaccinations, including the COVID booster or updated pneumonia vaccine, and is reluctant towards flu shots. He is considering scheduling an appointment with his dermatologist for routine skin checks.    Past Medical History:  Diagnosis Date   Arthritis    Elevated BP without diagnosis of hypertension 06/13/2021   GERD (gastroesophageal reflux disease)    RARE-TUMS   Sleep apnea    Past Surgical History:  Procedure Laterality Date   BIOPSY  05/29/2023   Procedure: BIOPSY;  Surgeon: Therisa Bi, MD;  Location: [facility] ENDOSCOPY;  Service: Gastroenterology;;   COLONOSCOPY  2009   COLONOSCOPY WITH PROPOFOL  N/A 07/27/2020   Procedure: COLONOSCOPY WITH PROPOFOL ;  Surgeon: Therisa Bi, MD;  Location: [facility] ENDOSCOPY;  Service: Gastroenterology;  Laterality: N/A;   ESOPHAGOGASTRODUODENOSCOPY (EGD) WITH PROPOFOL  N/A 05/29/2023   Procedure: ESOPHAGOGASTRODUODENOSCOPY (EGD) WITH PROPOFOL ;  Surgeon: Therisa Bi, MD;  Location: [facility] ENDOSCOPY;  Service: Gastroenterology;  Laterality: N/A;   HERNIA REPAIR  30 years ago   right inguinal   INGUINAL HERNIA REPAIR Right 05/20/2016   Procedure: LAPAROSCOPIC INGUINAL HERNIA;  Surgeon: Louanne KANDICE Muse, MD;  Location: [facility] ORS;  Service: General;  Laterality: Right;   TONSILLECTOMY AND  ADENOIDECTOMY     Social History   Socioeconomic History   Marital status: Married    Spouse name: Not on file   Number of children: 2   Years of education: Not on file   Highest education level: High school graduate  Occupational History   Occupation: full time / owner  Tobacco Use   Smoking status: Every Day    Current packs/day: 1.50    Average packs/day: 1.5 packs/day for 45.0 years (67.5 ttl pk-yrs)    Types: Cigarettes   Smokeless tobacco: Never   Tobacco comments:    Smoking 1.5 ppd.  Trying to cut back.  05/25/2022 hfb  Vaping Use   Vaping status: Never Used  Substance and Sexual Activity   Alcohol use: Yes    Alcohol/week: 14.0 standard drinks of alcohol    Types: 14 Shots of liquor per week    Comment: 2 MIXED DRINKS EVERY DAY   Drug use: No   Sexual activity: Not on file  Other Topics Concern   Not on file  Social History Narrative   Not on file   Social Drivers of Health   Financial Resource Strain: Low Risk  (11/11/2023)   Overall Financial Resource Strain (CARDIA)    Difficulty of Paying Living Expenses: Not hard at all  Food Insecurity: No Food Insecurity (11/11/2023)   Hunger Vital Sign    Worried About Running Out of Food in the Last Year: Never true    Ran Out of Food in the Last Year: Never true  Transportation Needs: No Transportation Needs (11/11/2023)   PRAPARE - Administrator, Civil Service (Medical): No    Lack of Transportation (Non-Medical): No  Physical Activity: Sufficiently Active (11/11/2023)   Exercise Vital Sign    Days of Exercise per Week: 7 days    Minutes of Exercise per Session: 50 min  Stress: No Stress Concern Present (11/11/2023)   [employer] of Occupational Health - Occupational Stress Questionnaire    Feeling of Stress: Not at all  Social Connections: Moderately Integrated (11/11/2023)   Social Connection and Isolation Panel    Frequency of Communication with Friends and Family: More than three times a week     Frequency of Social Gatherings with Friends and Family: More than three times a week    Attends Religious Services: 1 to 4 times per year    Active Member of [employer] or Organizations: No    Attends Banker Meetings: Never    Marital Status: Married  Catering manager Violence: Not At Risk (11/11/2023)   Humiliation, Afraid, Rape, and Kick questionnaire    Fear of Current or Ex-Partner: No    Emotionally Abused: No    Physically Abused: No    Sexually Abused: No   Family Status  Relation Name Status   Mother  Deceased   Father  Deceased   Brother 1 Deceased   MGF  Deceased   PGM  Deceased   PGF  Deceased   Sister  (Not  Specified)   Brother 2 (Not Specified)   Brother 3 (Not Specified)   Brother 4 (Not Specified)  No partnership data on file   Family History  Problem Relation Age of Onset   Heart disease Mother    Kidney cancer Mother    Bladder Cancer Mother    Arthritis Mother    CVA Father    Prostate cancer Father    Dementia Father    Hypertension Father    Heart attack Father    Heart attack Brother    Hyperlipidemia Brother    Heart disease Maternal Grandfather    Stroke Paternal Grandmother    Colon cancer Paternal Grandfather    Hyperlipidemia Sister    Hyperlipidemia Brother    Hyperlipidemia Brother    Hyperlipidemia Brother    No Known Allergies  Patient Care Team: Tandy Grawe, Lauraine SAILOR, DO as PCP - General (Family Medicine) Darliss Rogue, MD as PCP - Cardiology (Cardiology) Portia Fireman, OD (Optometry) Donzella Lauraine SAILOR, DO as Consulting Physician (Family Medicine)   Medications: Outpatient Medications Prior to Visit  Medication Sig   albuterol  (VENTOLIN  HFA) 108 (90 Base) MCG/ACT inhaler Inhale 1-2 puffs into the lungs every 6 (six) hours as needed.   omeprazole  (PRILOSEC) 40 MG capsule Take 1 capsule (40 mg total) by mouth daily.   sildenafil  (VIAGRA ) 25 MG tablet Take 1 tablet (25 mg total) by mouth daily as needed for erectile  dysfunction. 1 Hour prior to intercourse   No facility-administered medications prior to visit.    Review of Systems  Constitutional:  Negative for appetite change, chills, fatigue and fever.  HENT:  Negative for congestion, ear pain, hearing loss, nosebleeds and trouble swallowing.   Eyes:  Positive for visual disturbance (will be scheduling with eye doctor). Negative for pain.  Respiratory:  Positive for shortness of breath (intermittently). Negative for cough and chest tightness.   Cardiovascular:  Negative for chest pain, palpitations and leg swelling.  Gastrointestinal:  Negative for abdominal pain, blood in stool, constipation, diarrhea, nausea and vomiting.  Endocrine: Negative for polydipsia, polyphagia and polyuria.  Genitourinary:  Negative for dysuria and flank pain.  Musculoskeletal:  Negative for arthralgias, back pain, joint swelling, myalgias and neck stiffness.  Skin:  Negative for color change, rash and wound.  Neurological:  Positive for dizziness (intermittent). Negative for tremors, seizures, speech difficulty, weakness, light-headedness and headaches.  Psychiatric/Behavioral:  Negative for behavioral problems, confusion, decreased concentration, dysphoric mood and sleep disturbance. The patient is not nervous/anxious.   All other systems reviewed and are negative.     Objective    BP 136/79 (BP Location: Left Arm, Patient Position: Sitting, Cuff Size: Normal)   Pulse 60   Resp 16   Ht 6' (1.829 m)   Wt 191 lb 4.8 oz (86.8 kg)   SpO2 100%   BMI 25.94 kg/m    Physical Exam Vitals and nursing note reviewed.  Constitutional:      General: He is awake.     Appearance: Normal appearance.  HENT:     Head: Normocephalic and atraumatic.     Right Ear: Tympanic membrane, ear canal and external ear normal.     Left Ear: Tympanic membrane, ear canal and external ear normal.     Nose: Nose normal.     Mouth/Throat:     Mouth: Mucous membranes are moist.      Pharynx: Oropharynx is clear. No oropharyngeal exudate or posterior oropharyngeal erythema.   Eyes:     General:  No scleral icterus.    Extraocular Movements: Extraocular movements intact.     Conjunctiva/sclera: Conjunctivae normal.     Pupils: Pupils are equal, round, and reactive to light.   Neck:     Thyroid : No thyromegaly or thyroid  tenderness.   Cardiovascular:     Rate and Rhythm: Normal rate and regular rhythm.     Pulses: Normal pulses.     Heart sounds: Normal heart sounds.  Pulmonary:     Effort: Pulmonary effort is normal. No tachypnea, bradypnea or respiratory distress.     Breath sounds: Normal breath sounds. No stridor. No wheezing, rhonchi or rales.  Abdominal:     General: Bowel sounds are normal. There is no distension.     Palpations: Abdomen is soft. There is no mass.     Tenderness: There is no abdominal tenderness. There is no guarding.     Hernia: No hernia is present.   Musculoskeletal:     Cervical back: Normal range of motion and neck supple.     Right lower leg: No edema.     Left lower leg: No edema.  Lymphadenopathy:     Cervical: No cervical adenopathy.   Skin:    General: Skin is warm and dry.   Neurological:     Mental Status: He is alert and oriented to person, place, and time. Mental status is at baseline.   Psychiatric:        Mood and Affect: Mood normal.        Behavior: Behavior normal.       Last depression screening scores    11/11/2023   11:36 AM 11/11/2023    9:49 AM 10/22/2022    8:28 AM  PHQ 2/9 Scores  PHQ - 2 Score 0 0 0  PHQ- 9 Score 0 0    Last fall risk screening    11/11/2023   11:40 AM  Fall Risk   Falls in the past year? 0  Number falls in past yr: 0  Injury with Fall? 0  Risk for fall due to : No Fall Risks  Follow up Falls evaluation completed   Last Audit-C alcohol use screening    11/11/2023   11:36 AM  Alcohol Use Disorder Test (AUDIT)  1. How often do you have a drink containing alcohol? 4  2.  How many drinks containing alcohol do you have on a typical day when you are drinking? 0  3. How often do you have six or more drinks on one occasion? 0  AUDIT-C Score 4  4. How often during the last year have you found that you were not able to stop drinking once you had started? 0  5. How often during the last year have you failed to do what was normally expected from you because of drinking? 0  6. How often during the last year have you needed a first drink in the morning to get yourself going after a heavy drinking session? 0  7. How often during the last year have you had a feeling of guilt of remorse after drinking? 0  8. How often during the last year have you been unable to remember what happened the night before because you had been drinking? 0  9. Have you or someone else been injured as a result of your drinking? 0  10. Has a relative or friend or a doctor or another health worker been concerned about your drinking or suggested you cut down? 0  Alcohol Use  Disorder Identification Test Final Score (AUDIT) 4   A score of 3 or more in women, and 4 or more in men indicates increased risk for alcohol abuse, EXCEPT if all of the points are from question 1   No results found for any visits on 11/11/23.  Assessment & Plan    Routine Health Maintenance and Physical Exam  Exercise Activities and Dietary recommendations  Goals      DIET - EAT MORE FRUITS AND VEGETABLES     DIET - INCREASE WATER INTAKE        Immunization History  Administered Date(s) Administered   PFIZER(Purple Top)SARS-COV-2 Vaccination 01/05/2020, 01/26/2020   Pneumococcal Polysaccharide-23 11/08/2019   Tdap 03/18/2011    Health Maintenance  Topic Date Due   Pneumococcal Vaccine: 50+ Years (2 of 2 - PCV) 11/07/2020   Lung Cancer Screening  10/06/2023   Zoster Vaccines- Shingrix (1 of 2) 02/11/2024 (Originally 10/03/1974)   COVID-19 Vaccine (3 - Pfizer risk series) 02/17/2024 (Originally 02/23/2020)    DTaP/Tdap/Td (2 - Td or Tdap) 11/10/2024 (Originally 03/17/2021)   Medicare Annual Wellness (AWV)  11/10/2024   Colonoscopy  07/28/2027   Hepatitis C Screening  Completed   Hepatitis B Vaccines  Aged Out   HPV VACCINES  Aged Out   Meningococcal B Vaccine  Aged Out   INFLUENZA VACCINE  Discontinued    Discussed health benefits of physical activity, and encouraged him to engage in regular exercise appropriate for his age and condition.   Annual physical exam  Chronic obstructive pulmonary disease, unspecified COPD type (HCC)  Smoking greater than 30 pack years -     Nicotine; Place 1 patch (21 mg total) onto the skin daily.  Dispense: 28 patch; Refill: 1  Encounter for smoking cessation counseling -     Nicotine; Place 1 patch (21 mg total) onto the skin daily.  Dispense: 28 patch; Refill: 1  Aortic atherosclerosis (HCC) -     Comprehensive metabolic panel with GFR -     Lipid panel  Primary hypertension -     Comprehensive metabolic panel with GFR  Mixed hyperlipidemia -     Lipid panel  Chronic kidney disease, stage 2, mildly decreased GFR -     Microalbumin / creatinine urine ratio -     Comprehensive metabolic panel with GFR  Gastroesophageal reflux disease, unspecified whether esophagitis present -     Vitamin B12  High risk medication use -     Vitamin B12  Vitamin D  deficiency -     VITAMIN D  25 Hydroxy (Vit-D Deficiency, Fractures)     Annual physical exam Physical exam overall unremarkable except as noted above. Routine lab work ordered as noted. Due for vaccinations and screenings. Informed about vaccine availability. - Recommend pneumonia vaccine (Prevnar 20 or 21) at clinic or pharmacy. - Recommend tetanus (Tdap) and shingles vaccines at pharmacy. - Encourage COVID booster and flu shot. - Patient declines all vaccines at this time but is considering getting the Tdap, pneumonia and shingles vaccines at his pharmacy.  Chronic obstructive pulmonary  disease; smoking greater than 30 pack years; smoking cessation counseling COPD with only albuterol  available as needed.  Patient denies symptoms at this time.  Reports using his albuterol  only infrequently.  He does follow-up with pulmonology.  Defer to specialist management for COPD.  He is interested in smoking cessation. Prefers nicotine patches. - Prescribe 21 mg nicotine patches 12 hours on/off daily. - Consider nicotine lozenges for cravings if desired. - Follow  up on cessation progress and adjust patch dosage.  Lung Cancer Screening Pending confirmation of last CT scan completion. Follow-up with pulmonology scheduled. - Call pulmonology to confirm CT scan status and schedule if needed. - Follow up with pulmonology as planned.  Aortic atherosclerosis Noted.  No acute concerns.  Continue to monitor.  Hypertension Mildly elevated blood pressure at 136/79. Not on antihypertensives. - Monitor blood pressure regularly at home. - Encouraged lifestyle modifications with heart healthy diet and regular moderate intensity exercise of 150 minutes/week  Chronic kidney disease stage 2 Continue to optimize risk factors.  Obstructive Sleep Apnea Discontinued CPAP due to intolerance. Discussed CPAP benefits and recommended restarting.  Gastroesophageal Reflux Disease (GERD) Chronic sore throat due to silent GERD. Managed with omeprazole . - Continue omeprazole . - Check B12 level due to potential interference of PPI with absorption of B12.    Follow-up Pending annual wellness visit and blood work immediately after this visit. - Complete Medicare annual wellness visit via phone call. - Order and complete blood work. - Follow up with pulmonology regarding CT scan and next appointment.  Return in about 1 year (around 11/10/2024) for CPE, Chronic f/u.     I discussed the assessment and treatment plan with the patient  The patient was provided an opportunity to ask questions and all were  answered. The patient agreed with the plan and demonstrated an understanding of the instructions.   The patient was advised to call back or seek an in-person evaluation if the symptoms worsen or if the condition fails to improve as anticipated.    LAURAINE LOISE BUOY, DO  [facility] [phone] (phone) [phone] (fax)  [facility]

## 2023-11-11 NOTE — Patient Instructions (Addendum)
 Call your Pulmonologist to ask about your lung cancer screening.   Recommended vaccines to get at pharmacy:  - Tdap (tetanus, diphtheria and pertussis (whooping cough)). - Shingrix (shingles)

## 2023-11-12 LAB — COMPREHENSIVE METABOLIC PANEL WITH GFR
ALT: 11 IU/L (ref 0–44)
AST: 14 IU/L (ref 0–40)
Albumin: 4.2 g/dL (ref 3.9–4.9)
Alkaline Phosphatase: 151 IU/L — ABNORMAL HIGH (ref 44–121)
BUN/Creatinine Ratio: 13 (ref 10–24)
BUN: 13 mg/dL (ref 8–27)
Bilirubin Total: 0.6 mg/dL (ref 0.0–1.2)
CO2: 21 mmol/L (ref 20–29)
Calcium: 9 mg/dL (ref 8.6–10.2)
Chloride: 104 mmol/L (ref 96–106)
Creatinine, Ser: 0.99 mg/dL (ref 0.76–1.27)
Globulin, Total: 2.2 g/dL (ref 1.5–4.5)
Glucose: 86 mg/dL (ref 70–99)
Potassium: 4.7 mmol/L (ref 3.5–5.2)
Sodium: 142 mmol/L (ref 134–144)
Total Protein: 6.4 g/dL (ref 6.0–8.5)
eGFR: 82 mL/min/{1.73_m2} (ref >59)

## 2023-11-12 LAB — LIPID PANEL
Chol/HDL Ratio: 5.7 ratio — ABNORMAL HIGH (ref 0.0–5.0)
Cholesterol, Total: 216 mg/dL — ABNORMAL HIGH (ref 100–199)
HDL: 38 mg/dL — ABNORMAL LOW (ref >39)
LDL Chol Calc (NIH): 159 mg/dL — ABNORMAL HIGH (ref 0–99)
Triglycerides: 104 mg/dL (ref 0–149)
VLDL Cholesterol Cal: 19 mg/dL (ref 5–40)

## 2023-11-12 LAB — VITAMIN D 25 HYDROXY (VIT D DEFICIENCY, FRACTURES): Vit D, 25-Hydroxy: 26.7 ng/mL — ABNORMAL LOW (ref 30.0–100.0)

## 2023-11-12 LAB — MICROALBUMIN / CREATININE URINE RATIO
Creatinine, Urine: 165.9 mg/dL
Microalb/Creat Ratio: 2 mg/g{creat} (ref 0–29)
Microalbumin, Urine: 3.9 ug/mL

## 2023-11-12 LAB — VITAMIN B12: Vitamin B-12: 318 pg/mL (ref 232–1245)

## 2023-11-17 ENCOUNTER — Ambulatory Visit: Payer: Self-pay | Admitting: Family Medicine

## 2023-12-04 DIAGNOSIS — H52223 Regular astigmatism, bilateral: Secondary | ICD-10-CM | POA: Diagnosis not present

## 2023-12-04 DIAGNOSIS — H47323 Drusen of optic disc, bilateral: Secondary | ICD-10-CM | POA: Diagnosis not present

## 2023-12-04 DIAGNOSIS — H5203 Hypermetropia, bilateral: Secondary | ICD-10-CM | POA: Diagnosis not present

## 2023-12-04 DIAGNOSIS — H2513 Age-related nuclear cataract, bilateral: Secondary | ICD-10-CM | POA: Diagnosis not present

## 2023-12-28 ENCOUNTER — Encounter: Payer: Self-pay | Admitting: Family Medicine

## 2024-01-05 ENCOUNTER — Telehealth: Payer: Self-pay | Admitting: Emergency Medicine

## 2024-01-05 ENCOUNTER — Other Ambulatory Visit: Payer: Self-pay | Admitting: Acute Care

## 2024-01-05 DIAGNOSIS — F17210 Nicotine dependence, cigarettes, uncomplicated: Secondary | ICD-10-CM

## 2024-01-05 DIAGNOSIS — Z122 Encounter for screening for malignant neoplasm of respiratory organs: Secondary | ICD-10-CM

## 2024-01-05 DIAGNOSIS — Z87891 Personal history of nicotine dependence: Secondary | ICD-10-CM

## 2024-01-05 NOTE — Telephone Encounter (Signed)
 Looks like CT order was never placed.  I will place order for Sarah's signature & call pt to schedule.

## 2024-01-05 NOTE — Telephone Encounter (Signed)
 Patient is wanting to have his annual CT scheduled that he gets for the lung cancer screening program. He has not had one for this year. Then he will schedule follow up appointment with Dr. Shelah.

## 2024-01-14 DIAGNOSIS — J449 Chronic obstructive pulmonary disease, unspecified: Secondary | ICD-10-CM | POA: Diagnosis not present

## 2024-01-21 ENCOUNTER — Ambulatory Visit: Admission: RE | Admit: 2024-01-21 | Source: Ambulatory Visit

## 2024-01-22 ENCOUNTER — Ambulatory Visit
Admission: RE | Admit: 2024-01-22 | Discharge: 2024-01-22 | Disposition: A | Discharge status: Home / Self Care | Source: Ambulatory Visit | Attending: Acute Care | Admitting: Acute Care

## 2024-01-22 DIAGNOSIS — F17210 Nicotine dependence, cigarettes, uncomplicated: Secondary | ICD-10-CM | POA: Diagnosis not present

## 2024-01-22 DIAGNOSIS — Z87891 Personal history of nicotine dependence: Secondary | ICD-10-CM | POA: Diagnosis not present

## 2024-01-22 DIAGNOSIS — Z122 Encounter for screening for malignant neoplasm of respiratory organs: Secondary | ICD-10-CM | POA: Insufficient documentation

## 2024-01-28 DIAGNOSIS — H47323 Drusen of optic disc, bilateral: Secondary | ICD-10-CM | POA: Diagnosis not present

## 2024-02-05 ENCOUNTER — Other Ambulatory Visit: Payer: Self-pay | Admitting: Acute Care

## 2024-02-05 DIAGNOSIS — F17210 Nicotine dependence, cigarettes, uncomplicated: Secondary | ICD-10-CM

## 2024-02-05 DIAGNOSIS — Z87891 Personal history of nicotine dependence: Secondary | ICD-10-CM

## 2024-02-05 DIAGNOSIS — Z122 Encounter for screening for malignant neoplasm of respiratory organs: Secondary | ICD-10-CM

## 2024-02-12 ENCOUNTER — Encounter: Payer: Self-pay | Admitting: Emergency Medicine

## 2024-02-12 ENCOUNTER — Ambulatory Visit: Admitting: Emergency Medicine

## 2024-02-12 VITALS — BP 164/78 | HR 55 | Temp 98.1°F | Resp 18 | Ht 71.0 in | Wt 192.1 lb

## 2024-02-12 DIAGNOSIS — J449 Chronic obstructive pulmonary disease, unspecified: Secondary | ICD-10-CM

## 2024-02-12 DIAGNOSIS — R911 Solitary pulmonary nodule: Secondary | ICD-10-CM | POA: Diagnosis not present

## 2024-02-12 DIAGNOSIS — F17210 Nicotine dependence, cigarettes, uncomplicated: Secondary | ICD-10-CM | POA: Diagnosis not present

## 2024-02-12 DIAGNOSIS — G4733 Obstructive sleep apnea (adult) (pediatric): Secondary | ICD-10-CM

## 2024-02-12 NOTE — Assessment & Plan Note (Signed)
 Discussed the importance of smoking cessation with him.  At this point goal is for him to be able to cut down.  He is going to continue to try working on this (he has cut down from 2 packs to 1.5 packs daily).  Needs to continue to participate in lung cancer screening program and his next CT will be next September.

## 2024-02-12 NOTE — Assessment & Plan Note (Addendum)
 The index nodule that was being followed resolved.  He is now participating in lung cancer screening program

## 2024-02-12 NOTE — Assessment & Plan Note (Signed)
 Intolerant of CPAP.  Not interested in trying to restart at this time.

## 2024-02-12 NOTE — Progress Notes (Signed)
 Subjective:    Patient ID: Thomas Cross, male    DOB: 14-Sep-1955, 68 y.o.   MRN: [id number]  HPI  ROV 10/30/22 --follow-up visit for 68 year old man with a history of moderate obstruction and COPD, active tobacco use (100 pack years), moderate OSA.  I have seen him for this as well as an 11 mm mixed density right upper lobe nodule that has now resolved on subsequent imaging. He has some SOB and wheeze when laying flat. He coughs and clears clear mucous every day. He was in the ED 1 month ago for dyspnea.   CT-PA 10/06/2022 reviewed by me shows emphysema, no PE, no mediastinal or hilar adenopathy.  No nodules or masses.  Pulmonary function testing 10/24/2022 reviewed by me shows moderately severe obstruction without a bronchodilator response, normal lung volumes, decreased diffusion capacity  ROV 02/12/2024 --follow-up visit for 68 year old gentleman with a history of moderate COPD, active tobacco use, moderate OSA and resolved pulmonary nodule on CT scan of the chest.  We got him back into the lung cancer screening program and he had his scan done on 01/22/2024 as below. Smoking about 1.5 pk/day.  He has been managed on albuterol  only. I gave him stiolto that he used for 2 weeks, but then he changed it to prn.  Today he describes that he is somewhat limited with exertion - feels fatigued, some SOB. He is able to do his standard daily activities. Occasional cough, clears some mucous daily. He seldom uses albuterol  - sometimes in the evening, he is unsure if it helps him.  Not on CPAP, could not tolerate.   Lung cancer screening CT chest 01/22/2024 reviewed by me shows mild centrilobular emphysema with some secretions noted, no focal consolidation, scattered unchanged pulmonary nodules measuring up to 2 mm in the peripheral left lower lobe.  Lung RADS 2 study  Review of Systems As per HPi     Objective:   Physical Exam  Vitals:   02/12/24 0934  BP: (!) 164/78  Pulse: (!) 55  Resp: 18  Temp:  98.1 F (36.7 C)  TempSrc: Oral  SpO2: 98%  Weight: 192 lb 1.6 oz (87.1 kg)  Height: 5' 11 (1.803 m)   Gen: Pleasant, well-nourished, in no distress,  normal affect  ENT: No lesions,  mouth clear,  oropharynx clear, no postnasal drip  Neck: No JVD, inspiratory and expiratory upper airway noise  Lungs: No use of accessory muscles, coarse bilateral breath sounds, no wheezing  Cardiovascular: RRR, heart sounds normal, no murmur or gallops, no peripheral edema  Musculoskeletal: No deformities, no cyanosis or clubbing  Neuro: alert, awake, non focal  Skin: Warm, no lesions or rash     Assessment & Plan:  COPD (chronic obstructive pulmonary disease) (HCC) Trial of Stiolto for about 2 weeks, did not really notice any difference.  He stopped using it on a daily basis but does still sometimes turn to it as needed.  I have encouraged him to use albuterol  as his rescue medication.  Discussed the flu shot with him he does not want to get it, has never got it in the past.  OSA (obstructive sleep apnea) Intolerant of CPAP.  Not interested in trying to restart at this time.  Pulmonary nodule The index nodule that was being followed resolved.  He is now participating in lung cancer screening program  Smoking greater than 30 pack years Discussed the importance of smoking cessation with him.  At this point goal is for him  to be able to cut down.  He is going to continue to try working on this (he has cut down from 2 packs to 1.5 packs daily).  Needs to continue to participate in lung cancer screening program and his next CT will be next September.    Lamar Chris, MD, PhD 02/12/2024, 9:55 AM [facility] [phone] or if no answer before 7:00PM call [phone] For any issues after 7:00PM please call eLink [phone]

## 2024-02-12 NOTE — Assessment & Plan Note (Signed)
 Trial of Stiolto for about 2 weeks, did not really notice any difference.  He stopped using it on a daily basis but does still sometimes turn to it as needed.  I have encouraged him to use albuterol  as his rescue medication.  Discussed the flu shot with him he does not want to get it, has never got it in the past.

## 2024-02-12 NOTE — Patient Instructions (Signed)
 We reviewed your screening CT scan of the chest today.  The scan is stable.  Good news.  You need to have your repeat done in September 2026.  We will arrange for this Agree with stopping Stiolto since it did not help you significantly Keep your albuterol  available to use 2 puffs when needed for shortness of breath, chest tightness, wheezing. We discussed the flu shot.  You can consider getting that this fall if you change your mind Work on decreasing your cigarettes if at all possible.  Cutting down will help your breathing and your overall health Follow in our office in 1 year.  Please call us  if you have any problems or new symptoms.

## 2024-02-16 ENCOUNTER — Encounter: Payer: Self-pay | Admitting: Cardiology

## 2024-02-16 ENCOUNTER — Ambulatory Visit: Attending: Cardiology | Admitting: Cardiology

## 2024-02-16 VITALS — BP 150/80 | HR 64 | Ht 71.0 in | Wt 191.6 lb

## 2024-02-16 DIAGNOSIS — J449 Chronic obstructive pulmonary disease, unspecified: Secondary | ICD-10-CM

## 2024-02-16 DIAGNOSIS — I10 Essential (primary) hypertension: Secondary | ICD-10-CM

## 2024-02-16 DIAGNOSIS — R0602 Shortness of breath: Secondary | ICD-10-CM

## 2024-02-16 DIAGNOSIS — E782 Mixed hyperlipidemia: Secondary | ICD-10-CM

## 2024-02-16 DIAGNOSIS — G4733 Obstructive sleep apnea (adult) (pediatric): Secondary | ICD-10-CM

## 2024-02-16 DIAGNOSIS — F17210 Nicotine dependence, cigarettes, uncomplicated: Secondary | ICD-10-CM

## 2024-02-16 NOTE — Progress Notes (Signed)
 Cardiology Office Note   Date:  02/16/2024  ID:  BUNNIE LEDERMAN, DOB Oct 10, 1955, MRN [id number] PCP: Donzella Lauraine SAILOR, DO  [facility] Providers Cardiologist:  Redell Cave, MD Cardiology APP:  Gerard Frederick, NP     History of Present Illness Thomas Cross is a 68 y.o. male with past medical history of gastroesophageal reflux disease, arthritis, current smoker, obstructive sleep apnea not on CPAP, COPD, palpitations, who presents today for follow-up.   Prior echocardiogram completed 09/2019 revealed an LVEF of 55 to 60%, no RWMA.  ZIO monitor 06/2021 for complaint of palpitations revealed some runs of SVT.  PFTs were completed 01/2020 which revealed the patient had COPD Gold stage II with a diffusion defect likely emphysema with emphasis placed on smoking cessation.  Repeat echocardiogram completed 11/17/2022 revealed LVEF 55 to 60%, no RWMA, and no valvular abnormalities.  Cardiac PET stress test completed 03/08/2023 with LV perfusion that was normal with no evidence of ischemia or infarct.  Myocardial blood flow was computed and considered normal.  Coronary calcium  was absent on the attenuation correction CT images.  The study was normal and considered low risk.   Previously was seen in clinic 04/14/2023 to follow-up with doing well with cardiac perspective.  Continued to have chronic shortness of breath and dyspnea on exertion occasionally with chest discomfort that was more musculoskeletal and reproducible.  Continue to follow with GI and continued on PPI therapy for silent acid reflux.  He has an upcoming EGD that was scheduled to determine the origination of his symptoms.   He was last seen in clinic 6//25 that he had been doing well with current perspective.  Continues to have chronic shortness of breath is unchanged.  Recently followed up with GI and underwent EGD which showed no significant findings.  He was continued on acid reflux medications.  No medication changes were made or  further testing that was ordered.  He returns to clinic today stating that he has been doing well from the cardiac perspective.He continues to suffer from chronic shortness of breath and occasional lightheadedness. He continues to smoke 1.5 to 2 packs per day depending on his stress. 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 Blood pressure monitoring at home reveals blood pressures in the 140s.  States that he has continued to take PPI therapy daily at home.  Did not pick up nicotine  patches due to cost.  Patient stated that he was having some visual changes and followed up with [facility].  Denies any hospitalizations or visits to the emergency department.  ROS: 10 point review of system has been reviewed and considered negative the exception was listed in the HPI  Studies Reviewed EKG Interpretation Date/Time:  Tuesday February 16 2024 10:04:21 EDT Ventricular Rate:  64 PR Interval:  148 QRS Duration:  82 QT Interval:  418 QTC Calculation: 431 R Axis:   15  Text Interpretation: Normal sinus rhythm Normal ECG When compared with ECG of 14-Apr-2023 11:16, No significant change was found Confirmed by Gerard Frederick ([id number]) on 02/16/2024 10:05:35 AM    Cardiac PET 03/12/23   LV perfusion is normal. There is no evidence of ischemia. There is no evidence of infarction.   Rest left ventricular function is normal. Rest EF: 59%. Stress left ventricular function is normal. Stress EF: 57%. End diastolic cavity size is normal. End systolic cavity size is normal.   Myocardial blood flow was computed to be 0.84ml/g/min  at rest and 2.17ml/g/min at stress. Global myocardial blood flow reserve was 2.58 and was normal.   Coronary calcium  was absent on the attenuation correction CT images.   The study is normal. The study is low risk.   TTE 11/17/22 1. Left ventricular ejection fraction, by estimation, is 55 to 60%. The  left ventricle has normal function. The  left ventricle has no regional  wall motion abnormalities. Left ventricular diastolic parameters were  normal.   2. Right ventricular systolic function is normal. The right ventricular  size is mildly enlarged.   3. The mitral valve is normal in structure. No evidence of mitral valve  regurgitation.   4. The aortic valve is tricuspid. Aortic valve regurgitation is not  visualized.   5. The inferior vena cava is normal in size with greater than 50%  respiratory variability, suggesting right atrial pressure of 3 mmHg.    Zio monitor 07/16/21 Normal sinus rhythm Patient had a min HR of 42 bpm, max HR of 169 bpm, and avg HR of 64 bpm.    22 Supraventricular Tachycardia runs occurred, the run with the fastest interval lasting 9 beats with a max rate of 169 bpm, the longest lasting 15 beats with an avg rate of 108 bpm.   Isolated SVEs were rare (<1.0%), SVE Couplets were rare (<1.0%), and SVE Triplets were rare (<1.0%).   Isolated VEs were rare (<1.0%), and no VE Couplets or VE Triplets were present.    1 patient triggered events associated with normal sinus rhythm Other events not triggered   TTE 09/29/19 1. Left ventricular ejection fraction, by estimation, is 55 to 60%. The  left ventricle has normal function. The left ventricle has no regional  wall motion abnormalities. Left ventricular diastolic parameters were  normal.   2. Right ventricular systolic function is normal. The right ventricular  size is normal.   3. Left atrial size was mildly dilated.   4. The inferior vena cava is dilated in size with >50% respiratory  variability, suggesting right atrial pressure of 8 mmHg.  Risk Assessment/Calculations   HYPERTENSION CONTROL Vitals:   02/16/24 0957 02/16/24 1000  BP: (!) 150/80 (!) 150/80    The patient's blood pressure is elevated above target today.  In order to address the patient's elevated BP: Blood pressure will be monitored at home to determine if medication  changes need to be made.; The blood pressure is usually elevated in clinic.  Blood pressures monitored at home have been optimal.          Physical Exam VS:  BP (!) 150/80 (BP Location: Left Arm, Patient Position: Sitting, Cuff Size: Normal)   Pulse 64   Ht 5' 11 (1.803 m)   Wt 191 lb 9.6 oz (86.9 kg)   SpO2 98%   BMI 26.72 kg/m        Wt Readings from Last 3 Encounters:  02/16/24 191 lb 9.6 oz (86.9 kg)  02/12/24 192 lb 1.6 oz (87.1 kg)  11/11/23 191 lb 4.8 oz (86.8 kg)    GEN: Well nourished, well developed in no acute distress NECK: No JVD; No carotid bruits CARDIAC: RRR, no murmurs, rubs, gallops RESPIRATORY:  Clear to auscultation without rales, wheezing or rhonchi  ABDOMEN: Soft, non-tender, non-distended EXTREMITIES:  No edema; No deformity   ASSESSMENT AND PLAN Chronic shortness of breath dyspnea on exertion with COPD and continued tobacco abuse.  Patient states that shortness of breath is unchanged in severity from previously.  Recently had  lung scan done that was unchanged.  Continues to follow with pulmonary.  Continues to smoke 1-1/2 to 2 packs/day with continued smoking sensation recommended.  Declined NicoDerm CQ  patches due to costs.  Hypertension with blood pressure today 150/80.  Blood pressures have been running in the 140s at home.  States that he has longstanding history of whitecoat hypertension.  Has been encouraged to follow low-sodium diet and monitor pressures at home.  Continues to decline medication options.  EKG done today revealing sinus rhythm with a rate of 64 with no ischemic changes noted today.  Mixed hyperlipidemia with last LDL of 149.  Continues to decline statin therapy.  Obstructive sleep apnea had previously been noncompliant with CPAP.  States that he was advised by pulmonary that he no longer needed CPAP.  Tobacco abuse total cessation continues to be recommended.       Dispo: Patient to return to clinic to see MD/APP in 6 months or  sooner if needed for further evaluation.  They have requested the most recent note from [facility] to ensure that there were no findings of Hollenhorst plaque on recent exam to negate further testing from the cardiovascular standpoint  Signed, Buel Molder, NP

## 2024-02-16 NOTE — Patient Instructions (Signed)
 Medication Instructions:  Your physician recommends that you continue on your current medications as directed. Please refer to the Current Medication list given to you today.  *If you need a refill on your cardiac medications before your next appointment, please call your pharmacy*  Lab Work: No labs ordered today  If you have labs (blood work) drawn today and your tests are completely normal, you will receive your results only by: MyChart Message (if you have MyChart) OR A paper copy in the mail If you have any lab test that is abnormal or we need to change your treatment, we will call you to review the results.  Testing/Procedures: No test ordered today   Follow-Up: At [facility], you and your health needs are our priority.  As part of our continuing mission to provide you with exceptional heart care, our providers are all part of one team.  This team includes your primary Cardiologist (physician) and Advanced Practice Providers or APPs (Physician Assistants and Nurse Practitioners) who all work together to provide you with the care you need, when you need it.  Your next appointment:   6 month(s)  Provider:   You may see Constancia Delton, MD or one of the following Advanced Practice Providers on your designated Care Team:   Laneta Pintos, NP Gildardo Labrador, PA-C Varney Gentleman, PA-C Cadence [place], PA-C Ronald Cockayne, NP Morey Ar, NP

## 2024-02-17 NOTE — Progress Notes (Signed)
 Called [facility] to request last eye exam from their office.

## 2024-02-25 DIAGNOSIS — H47323 Drusen of optic disc, bilateral: Secondary | ICD-10-CM | POA: Diagnosis not present

## 2024-02-25 DIAGNOSIS — H2513 Age-related nuclear cataract, bilateral: Secondary | ICD-10-CM | POA: Diagnosis not present

## 2024-03-31 NOTE — Progress Notes (Signed)
 Thomas Cross                                          MRN: [id number]   03/31/2024   The [facility] Quality Team Specialist reviewed this patient medical record for the purposes of chart review for care gap closure. The following were reviewed: chart review for care gap closure-controlling blood pressure.    [facility] Quality Team

## 2024-05-06 NOTE — Progress Notes (Signed)
 Thomas Cross                                          MRN: [id number]   05/06/2024   The [facility] Quality Team Specialist reviewed this patient medical record for the purposes of chart review for care gap closure. The following were reviewed: chart review for care gap closure-controlling blood pressure.    [facility] Quality Team

## 2024-06-22 NOTE — Progress Notes (Signed)
 Thomas Cross                                          MRN: [id number]   06/22/2024   The [facility] Quality Team Specialist reviewed this patient medical record for the purposes of chart review for care gap closure. The following were reviewed: chart review for care gap closure-controlling blood pressure.    [facility] Quality Team

## 2024-08-17 ENCOUNTER — Ambulatory Visit: Admitting: Student

## 2024-11-14 ENCOUNTER — Encounter: Admitting: Family Medicine

## 2024-11-16 ENCOUNTER — Ambulatory Visit
